# Patient Record
Sex: Male | Born: 1965 | Race: White | Hispanic: No | Smoking: Former smoker
Health system: Southern US, Community
[De-identification: ages and names within clinical notes are randomized; demographics above are authoritative.]

## PROBLEM LIST (undated history)

## (undated) DIAGNOSIS — E785 Hyperlipidemia, unspecified: Secondary | ICD-10-CM

## (undated) DIAGNOSIS — T7840XA Allergy, unspecified, initial encounter: Secondary | ICD-10-CM

## (undated) DIAGNOSIS — M51369 Other intervertebral disc degeneration, lumbar region without mention of lumbar back pain or lower extremity pain: Secondary | ICD-10-CM

## (undated) DIAGNOSIS — K219 Gastro-esophageal reflux disease without esophagitis: Secondary | ICD-10-CM

## (undated) DIAGNOSIS — E559 Vitamin D deficiency, unspecified: Secondary | ICD-10-CM

## (undated) DIAGNOSIS — G4730 Sleep apnea, unspecified: Secondary | ICD-10-CM

## (undated) DIAGNOSIS — U071 COVID-19: Secondary | ICD-10-CM

## (undated) DIAGNOSIS — I10 Essential (primary) hypertension: Secondary | ICD-10-CM

## (undated) DIAGNOSIS — K589 Irritable bowel syndrome without diarrhea: Secondary | ICD-10-CM

## (undated) DIAGNOSIS — M47816 Spondylosis without myelopathy or radiculopathy, lumbar region: Secondary | ICD-10-CM

## (undated) HISTORY — DX: Hyperlipidemia, unspecified: E78.5

## (undated) HISTORY — DX: Vitamin D deficiency, unspecified: E55.9

## (undated) HISTORY — DX: Allergy, unspecified, initial encounter: T78.40XA

## (undated) HISTORY — DX: Irritable bowel syndrome, unspecified: K58.9

## (undated) HISTORY — PX: COLONOSCOPY: SHX174

## (undated) HISTORY — DX: Gastro-esophageal reflux disease without esophagitis: K21.9

## (undated) HISTORY — DX: Sleep apnea, unspecified: G47.30

## (undated) HISTORY — DX: Essential (primary) hypertension: I10

---

## 1997-11-29 ENCOUNTER — Other Ambulatory Visit: Admission: RE | Admit: 1997-11-29 | Discharge: 1997-11-29 | Payer: Self-pay | Admitting: Internal Medicine

## 1998-01-31 ENCOUNTER — Other Ambulatory Visit: Admission: RE | Admit: 1998-01-31 | Discharge: 1998-01-31 | Payer: Self-pay | Admitting: Internal Medicine

## 2001-04-24 ENCOUNTER — Encounter: Payer: Self-pay | Admitting: Internal Medicine

## 2001-04-24 ENCOUNTER — Ambulatory Visit (HOSPITAL_COMMUNITY): Admission: RE | Admit: 2001-04-24 | Discharge: 2001-04-24 | Payer: Self-pay | Admitting: Internal Medicine

## 2002-05-15 HISTORY — PX: CHEST TUBE INSERTION: SHX231

## 2005-01-04 ENCOUNTER — Ambulatory Visit: Payer: Self-pay | Admitting: Gastroenterology

## 2005-01-17 ENCOUNTER — Ambulatory Visit: Payer: Self-pay | Admitting: Gastroenterology

## 2006-08-05 HISTORY — PX: TOE SURGERY: SHX1073

## 2008-05-01 ENCOUNTER — Emergency Department (HOSPITAL_COMMUNITY): Admission: EM | Admit: 2008-05-01 | Discharge: 2008-05-01 | Payer: Self-pay | Admitting: Family Medicine

## 2012-05-24 ENCOUNTER — Emergency Department: Payer: Self-pay | Admitting: Emergency Medicine

## 2012-09-25 ENCOUNTER — Ambulatory Visit (HOSPITAL_COMMUNITY)
Admission: RE | Admit: 2012-09-25 | Discharge: 2012-09-25 | Disposition: A | Discharge status: Home / Self Care | Payer: BC Managed Care – PPO | Source: Ambulatory Visit | Attending: Internal Medicine | Admitting: Internal Medicine

## 2012-09-25 ENCOUNTER — Other Ambulatory Visit (HOSPITAL_COMMUNITY): Payer: Self-pay | Admitting: Internal Medicine

## 2012-09-25 DIAGNOSIS — M545 Low back pain, unspecified: Secondary | ICD-10-CM | POA: Insufficient documentation

## 2012-09-25 DIAGNOSIS — G9589 Other specified diseases of spinal cord: Secondary | ICD-10-CM | POA: Insufficient documentation

## 2012-09-25 DIAGNOSIS — M5380 Other specified dorsopathies, site unspecified: Secondary | ICD-10-CM | POA: Insufficient documentation

## 2013-01-08 ENCOUNTER — Other Ambulatory Visit (HOSPITAL_COMMUNITY): Payer: Self-pay | Admitting: Internal Medicine

## 2013-01-08 ENCOUNTER — Ambulatory Visit (HOSPITAL_COMMUNITY)
Admission: RE | Admit: 2013-01-08 | Discharge: 2013-01-08 | Disposition: A | Discharge status: Home / Self Care | Payer: BC Managed Care – PPO | Source: Ambulatory Visit | Attending: Internal Medicine | Admitting: Internal Medicine

## 2013-01-08 DIAGNOSIS — M25531 Pain in right wrist: Secondary | ICD-10-CM

## 2013-01-08 DIAGNOSIS — M25539 Pain in unspecified wrist: Secondary | ICD-10-CM | POA: Insufficient documentation

## 2013-07-13 ENCOUNTER — Encounter: Payer: Self-pay | Admitting: Internal Medicine

## 2013-07-13 ENCOUNTER — Other Ambulatory Visit: Payer: Self-pay | Admitting: Emergency Medicine

## 2013-07-15 ENCOUNTER — Other Ambulatory Visit: Payer: Self-pay | Admitting: Emergency Medicine

## 2013-07-19 ENCOUNTER — Ambulatory Visit: Payer: Self-pay | Admitting: Internal Medicine

## 2013-08-04 ENCOUNTER — Ambulatory Visit: Payer: Self-pay | Admitting: Internal Medicine

## 2013-08-08 ENCOUNTER — Other Ambulatory Visit: Payer: Self-pay | Admitting: Internal Medicine

## 2013-08-12 ENCOUNTER — Ambulatory Visit: Payer: Self-pay | Admitting: Internal Medicine

## 2013-09-13 ENCOUNTER — Other Ambulatory Visit: Payer: Self-pay | Admitting: Emergency Medicine

## 2013-09-19 ENCOUNTER — Encounter: Payer: Self-pay | Admitting: Internal Medicine

## 2013-09-19 DIAGNOSIS — E559 Vitamin D deficiency, unspecified: Secondary | ICD-10-CM | POA: Insufficient documentation

## 2013-09-19 DIAGNOSIS — E782 Mixed hyperlipidemia: Secondary | ICD-10-CM | POA: Insufficient documentation

## 2013-09-19 DIAGNOSIS — R7309 Other abnormal glucose: Secondary | ICD-10-CM | POA: Insufficient documentation

## 2013-09-19 DIAGNOSIS — Z79899 Other long term (current) drug therapy: Secondary | ICD-10-CM | POA: Insufficient documentation

## 2013-09-19 DIAGNOSIS — I10 Essential (primary) hypertension: Secondary | ICD-10-CM | POA: Insufficient documentation

## 2013-09-19 NOTE — Patient Instructions (Signed)

## 2013-09-19 NOTE — Progress Notes (Signed)
Patient ID: Matthew Salinas, male   DOB: 04/13/1966, 48 y.o.   MRN: [id number]  HPI 48 y.o. male  presents for 3 month follow up with hypertension, hyperlipidemia, prediabetes and vitamin D. His blood pressure has been controlled at home, today their BP is BP: 116/82 mmHg He denies chest pain, shortness of breath, dizziness.  His cholesterol is diet controlled. His cholesterol is not controlled but patient refuses medications, he understands the risk of stroke, MI, and death. The cholesterol last visit was:  LDL 199.  He has been working on diet and exercise for prediabetes, and denies blurry vision, polydipsia, polyphagia and polyuria. Last A1C in the office was: 5.5  Patient is on Vitamin D supplement.  He had a cut on his right hand from chopping wood, 3 days ago, no signs of infection but he works with his hands and states it keeps popping open.  Area on right medial knee, umbilicated and continues to come back.  Right shoulder pain, has seen ortho, has had infections, continues to have pain and unable to lie on that side.    Current Medications:  Current Outpatient Prescriptions on File Prior to Visit  Medication Sig Dispense Refill  . ALPRAZolam (XANAX) 0.5 MG tablet Take 0.5 mg by mouth 2 (two) times daily as needed for anxiety.      . ANUCORT-HC 25 MG suppository INSERT ONE SUPPOSITORY RECTALLY THREE TIMES DAILY AS NEEDED  30 suppository  0  . cholecalciferol (VITAMIN D) 1000 UNITS tablet Take 2,000 Units by mouth daily.      Marland Kitchen lisinopril-hydrochlorothiazide (PRINZIDE,ZESTORETIC) 20-25 MG per tablet Take 1 tablet by mouth daily.      Marland Kitchen LORazepam (ATIVAN) 2 MG tablet TAKE ONE-HALF TO ONE TABLET BY MOUTH AT BEDTIME AS NEEDED FOR SLEEP  30 tablet  0  . Multiple Vitamins-Minerals (MULTIVITAMIN PO) Take by mouth.      Marland Kitchen omeprazole (PRILOSEC) 40 MG capsule Take 40 mg by mouth daily.       No current facility-administered medications on file prior to visit.   Medical History:  Past  Medical History  Diagnosis Date  . Hypertension   . Hyperlipidemia   . GERD (gastroesophageal reflux disease)   . Vitamin D deficiency   . IBS (irritable bowel syndrome)    Allergies:  Allergies  Allergen Reactions  . Prozac [Fluoxetine Hcl]     ROS Constitutional: Denies fever, chills, headaches, insomnia, fatigue, night sweats Eyes: Denies redness, blurred vision, diplopia, discharge, itchy, watery eyes.  ENT:  + snoring Denies congestion, post nasal drip, sore throat, earache, dental pain, Tinnitus, Vertigo, Sinus pain Cardio: Denies chest pain, palpitations, irregular heartbeat, dyspnea, diaphoresis, orthopnea, PND, claudication, edema Respiratory: denies cough, shortness of breath, wheezing.  Gastrointestinal: Denies dysphagia, heartburn, AB pain/ cramps, N/V, diarrhea, constipation, hematemesis, melena, hematochezia,  hemorrhoids Genitourinary: Denies dysuria, frequency, urgency, nocturia, hesitancy, discharge, hematuria, flank pain Musculoskeletal: + right shoulder pain Denies myalgia, stiffness, pain, swelling and strain/sprain. Skin: + skin cut Denies pruritis, rash, changing in skin lesion Neuro: Denies Weakness, tremor, incoordination, spasms, pain Psychiatric: Denies confusion, memory loss, sensory loss Endocrine: Denies change in weight, skin, hair change, nocturia Diabetic Polys, Denies visual blurring, hyper /hypo glycemic episodes, and paresthesia, Heme/Lymph: Denies Excessive bleeding, bruising, enlarged lymph nodes  Family history- Review and unchanged Social history- Review and unchanged Physical Exam: Filed Vitals:   09/20/13 1112  BP: 116/82  Pulse: 72  Temp: 97.9 F (36.6 C)  Resp: 16   Filed Weights  09/20/13 1112  Weight: 211 lb (95.709 kg)   General Appearance: Well nourished, in no apparent distress. Eyes: PERRLA, EOMs, conjunctiva no swelling or erythema Sinuses: No Frontal/maxillary tenderness ENT/Mouth: Ext aud canals clear, TMs without  erythema, bulging. No erythema, swelling, or exudate on post pharynx.  Tonsils are large and likely obstructive but not erythematous. Hearing normal.  Neck: Supple, thyroid normal.  Respiratory: Respiratory effort normal, BS equal bilaterally without rales, rhonchi, wheezing or stridor.  Cardio: RRR with no MRGs. Brisk peripheral pulses without edema.  Abdomen: Soft, + BS.  Non tender, no guarding, rebound, hernias, masses. Lymphatics: Non tender without lymphadenopathy.  Musculoskeletal: Full ROM, 5/5 strength, normal gait.  Skin: Warm, dry without rashes, lesions, ecchymosis. Cut on right hand with flap, no discharge, swelling, mild erythema. .  Neuro: Cranial nerves intact. Normal muscle tone, no cerebellar symptoms. Sensation intact.  Psych: Awake and oriented X 3, normal affect, Insight and Judgment appropriate.   Assessment and Plan:  Hypertension: Continue medication, monitor blood pressure at home.  Continue DASH diet. Cholesterol: Continue diet and exercise. Check cholesterol.  Pre-diabetes-Continue diet and exercise. Check A1C Vitamin D Def- check level and continue medications.  Right leg- schedule removal Right hand- cleaned and applied tegaderm Right shoulder pain- follow up ortho Snoring- likely OSA from tonsils- suggest see Dr. Toy Cookey for sleep medicine- patient will think about it.  Continue diet and meds as discussed. Further disposition pending results of labs.  Vicie Mutters 11:44 AM

## 2013-09-20 ENCOUNTER — Ambulatory Visit (INDEPENDENT_AMBULATORY_CARE_PROVIDER_SITE_OTHER): Payer: BC Managed Care – PPO | Admitting: Physician Assistant

## 2013-09-20 ENCOUNTER — Encounter: Payer: Self-pay | Admitting: Internal Medicine

## 2013-09-20 VITALS — BP 116/82 | HR 72 | Temp 97.9°F | Resp 16 | Wt 211.0 lb

## 2013-09-20 DIAGNOSIS — I10 Essential (primary) hypertension: Secondary | ICD-10-CM

## 2013-09-20 DIAGNOSIS — E782 Mixed hyperlipidemia: Secondary | ICD-10-CM

## 2013-09-20 DIAGNOSIS — R7309 Other abnormal glucose: Secondary | ICD-10-CM

## 2013-09-20 DIAGNOSIS — E559 Vitamin D deficiency, unspecified: Secondary | ICD-10-CM

## 2013-09-20 DIAGNOSIS — Z79899 Other long term (current) drug therapy: Secondary | ICD-10-CM

## 2013-09-20 DIAGNOSIS — R7303 Prediabetes: Secondary | ICD-10-CM

## 2013-09-20 LAB — HEMOGLOBIN A1C
Hgb A1c MFr Bld: 5.3 % (ref <5.7)
Mean Plasma Glucose: 105 mg/dL (ref <117)

## 2013-09-20 LAB — CBC WITH DIFFERENTIAL/PLATELET
BASOS ABS: 0.1 10*3/uL (ref 0.0–0.1)
Basophils Relative: 1 % (ref 0–1)
EOS PCT: 1 % (ref 0–5)
Eosinophils Absolute: 0.1 10*3/uL (ref 0.0–0.7)
HEMATOCRIT: 45 % (ref 39.0–52.0)
Hemoglobin: 15.4 g/dL (ref 13.0–17.0)
LYMPHS PCT: 32 % (ref 12–46)
Lymphs Abs: 2.1 10*3/uL (ref 0.7–4.0)
MCH: 31 pg (ref 26.0–34.0)
MCHC: 34.2 g/dL (ref 30.0–36.0)
MCV: 90.7 fL (ref 78.0–100.0)
Monocytes Absolute: 0.5 10*3/uL (ref 0.1–1.0)
Monocytes Relative: 7 % (ref 3–12)
NEUTROS ABS: 3.8 10*3/uL (ref 1.7–7.7)
NEUTROS PCT: 59 % (ref 43–77)
PLATELETS: 326 10*3/uL (ref 150–400)
RBC: 4.96 MIL/uL (ref 4.22–5.81)
RDW: 12.4 % (ref 11.5–15.5)
WBC: 6.5 10*3/uL (ref 4.0–10.5)

## 2013-09-20 LAB — MAGNESIUM: Magnesium: 2 mg/dL (ref 1.5–2.5)

## 2013-09-20 MED ORDER — HYDROCODONE-ACETAMINOPHEN 5-325 MG PO TABS: 1.0000 | ORAL_TABLET | Freq: Four times a day (QID) | ORAL | Status: DC | PRN

## 2013-09-21 LAB — BASIC METABOLIC PANEL WITH GFR
BUN: 13 mg/dL (ref 6–23)
CO2: 28 mEq/L (ref 19–32)
CREATININE: 0.86 mg/dL (ref 0.50–1.35)
Calcium: 9.8 mg/dL (ref 8.4–10.5)
Chloride: 96 mEq/L (ref 96–112)
GFR, Est African American: >89 mL/min
GFR, Est Non African American: >89 mL/min
Glucose, Bld: 96 mg/dL (ref 70–99)
POTASSIUM: 4.6 meq/L (ref 3.5–5.3)
Sodium: 137 mEq/L (ref 135–145)

## 2013-09-21 LAB — HEPATIC FUNCTION PANEL
ALBUMIN: 5 g/dL (ref 3.5–5.2)
ALT: 21 U/L (ref 0–53)
AST: 18 U/L (ref 0–37)
Alkaline Phosphatase: 51 U/L (ref 39–117)
Bilirubin, Direct: 0.1 mg/dL (ref 0.0–0.3)
Indirect Bilirubin: 0.6 mg/dL (ref 0.2–1.2)
Total Bilirubin: 0.7 mg/dL (ref 0.2–1.2)
Total Protein: 7.6 g/dL (ref 6.0–8.3)

## 2013-09-21 LAB — LIPID PANEL
Cholesterol: 297 mg/dL — ABNORMAL HIGH (ref 0–200)
HDL: 49 mg/dL (ref >39)
LDL CALC: 202 mg/dL — AB (ref 0–99)
Total CHOL/HDL Ratio: 6.1 Ratio
Triglycerides: 228 mg/dL — ABNORMAL HIGH (ref <150)
VLDL: 46 mg/dL — ABNORMAL HIGH (ref 0–40)

## 2013-09-21 LAB — INSULIN, FASTING: INSULIN FASTING, SERUM: 5 u[IU]/mL (ref 3–28)

## 2013-09-21 LAB — TSH: TSH: 1.399 u[IU]/mL (ref 0.350–4.500)

## 2013-09-21 LAB — VITAMIN D 25 HYDROXY (VIT D DEFICIENCY, FRACTURES): VIT D 25 HYDROXY: 73 ng/mL (ref 30–89)

## 2013-10-31 ENCOUNTER — Other Ambulatory Visit: Payer: Self-pay | Admitting: Emergency Medicine

## 2013-11-01 ENCOUNTER — Other Ambulatory Visit: Payer: Self-pay | Admitting: Emergency Medicine

## 2013-12-05 ENCOUNTER — Other Ambulatory Visit: Payer: Self-pay | Admitting: Internal Medicine

## 2013-12-20 ENCOUNTER — Other Ambulatory Visit: Payer: Self-pay | Admitting: Emergency Medicine

## 2013-12-21 ENCOUNTER — Other Ambulatory Visit: Payer: Self-pay | Admitting: Emergency Medicine

## 2014-01-18 ENCOUNTER — Encounter: Payer: Self-pay | Admitting: Internal Medicine

## 2014-01-19 ENCOUNTER — Ambulatory Visit (INDEPENDENT_AMBULATORY_CARE_PROVIDER_SITE_OTHER): Payer: BC Managed Care – PPO | Admitting: Internal Medicine

## 2014-01-19 ENCOUNTER — Encounter: Payer: Self-pay | Admitting: Internal Medicine

## 2014-01-19 VITALS — BP 106/70 | HR 76 | Temp 97.9°F | Resp 16 | Ht 70.0 in | Wt 213.4 lb

## 2014-01-19 DIAGNOSIS — K219 Gastro-esophageal reflux disease without esophagitis: Secondary | ICD-10-CM

## 2014-01-19 DIAGNOSIS — K649 Unspecified hemorrhoids: Secondary | ICD-10-CM

## 2014-01-19 DIAGNOSIS — Z125 Encounter for screening for malignant neoplasm of prostate: Secondary | ICD-10-CM

## 2014-01-19 DIAGNOSIS — I10 Essential (primary) hypertension: Secondary | ICD-10-CM

## 2014-01-19 DIAGNOSIS — D485 Neoplasm of uncertain behavior of skin: Secondary | ICD-10-CM

## 2014-01-19 DIAGNOSIS — R7401 Elevation of levels of liver transaminase levels: Secondary | ICD-10-CM

## 2014-01-19 DIAGNOSIS — E559 Vitamin D deficiency, unspecified: Secondary | ICD-10-CM

## 2014-01-19 DIAGNOSIS — Z0000 Encounter for general adult medical examination without abnormal findings: Secondary | ICD-10-CM

## 2014-01-19 DIAGNOSIS — R740 Nonspecific elevation of levels of transaminase and lactic acid dehydrogenase [LDH]: Secondary | ICD-10-CM

## 2014-01-19 DIAGNOSIS — Z113 Encounter for screening for infections with a predominantly sexual mode of transmission: Secondary | ICD-10-CM

## 2014-01-19 DIAGNOSIS — Z1212 Encounter for screening for malignant neoplasm of rectum: Secondary | ICD-10-CM

## 2014-01-19 DIAGNOSIS — M545 Low back pain, unspecified: Secondary | ICD-10-CM

## 2014-01-19 DIAGNOSIS — Z111 Encounter for screening for respiratory tuberculosis: Secondary | ICD-10-CM

## 2014-01-19 LAB — CBC WITH DIFFERENTIAL/PLATELET
BASOS ABS: 0 10*3/uL (ref 0.0–0.1)
Basophils Relative: 1 % (ref 0–1)
Eosinophils Absolute: 0 10*3/uL (ref 0.0–0.7)
Eosinophils Relative: 1 % (ref 0–5)
HEMATOCRIT: 42.3 % (ref 39.0–52.0)
HEMOGLOBIN: 14.9 g/dL (ref 13.0–17.0)
Lymphocytes Relative: 41 % (ref 12–46)
Lymphs Abs: 1.6 10*3/uL (ref 0.7–4.0)
MCH: 31 pg (ref 26.0–34.0)
MCHC: 35.2 g/dL (ref 30.0–36.0)
MCV: 87.9 fL (ref 78.0–100.0)
MONO ABS: 0.6 10*3/uL (ref 0.1–1.0)
MONOS PCT: 17 % — AB (ref 3–12)
NEUTROS ABS: 1.5 10*3/uL — AB (ref 1.7–7.7)
Neutrophils Relative %: 40 % — ABNORMAL LOW (ref 43–77)
Platelets: 272 10*3/uL (ref 150–400)
RBC: 4.81 MIL/uL (ref 4.22–5.81)
RDW: 12.9 % (ref 11.5–15.5)
WBC: 3.8 10*3/uL — AB (ref 4.0–10.5)

## 2014-01-19 LAB — HEMOGLOBIN A1C
Hgb A1c MFr Bld: 5.4 % (ref <5.7)
Mean Plasma Glucose: 108 mg/dL (ref <117)

## 2014-01-19 MED ORDER — HYDROCORTISONE 2.5 % RE CREA: 1.0000 "application " | TOPICAL_CREAM | Freq: Four times a day (QID) | RECTAL | Status: DC

## 2014-01-19 MED ORDER — HYDROCODONE-ACETAMINOPHEN 5-325 MG PO TABS: 1.0000 | ORAL_TABLET | Freq: Four times a day (QID) | ORAL | Status: DC | PRN

## 2014-01-19 MED ORDER — ESOMEPRAZOLE MAGNESIUM 40 MG PO CPDR: 40.0000 mg | DELAYED_RELEASE_CAPSULE | Freq: Every day | ORAL | Status: DC

## 2014-01-19 NOTE — Patient Instructions (Signed)
 Hypertension As your heart beats, it forces blood through your arteries. This force is your blood pressure. If the pressure is too high, it is called hypertension (HTN) or high blood pressure. HTN is dangerous because you may have it and not know it. High blood pressure may mean that your heart has to work harder to pump blood. Your arteries may be narrow or stiff. The extra work puts you at risk for heart disease, stroke, and other problems.  Blood pressure consists of two numbers, a higher number over a lower, 110/72, for example. It is stated as "110 over 72." The ideal is below 120 for the top number (systolic) and under 80 for the bottom (diastolic). Write down your blood pressure today. You should pay close attention to your blood pressure if you have certain conditions such as:  Heart failure.  Prior heart attack.  Diabetes  Chronic kidney disease.  Prior stroke.  Multiple risk factors for heart disease. To see if you have HTN, your blood pressure should be measured while you are seated with your arm held at the level of the heart. It should be measured at least twice. A one-time elevated blood pressure reading (especially in the Emergency Department) does not mean that you need treatment. There may be conditions in which the blood pressure is different between your right and left arms. It is important to see your caregiver soon for a recheck. Most people have essential hypertension which means that there is not a specific cause. This type of high blood pressure may be lowered by changing lifestyle factors such as:  Stress.  Smoking.  Lack of exercise.  Excessive weight.  Drug/tobacco/alcohol use.  Eating less salt. Most people do not have symptoms from high blood pressure until it has caused damage to the body. Effective treatment can often prevent, delay or reduce that damage. TREATMENT  When a cause has been identified, treatment for high blood pressure is directed at  the cause. There are a large number of medications to treat HTN. These fall into several categories, and your caregiver will help you select the medicines that are best for you. Medications may have side effects. You should review side effects with your caregiver. If your blood pressure stays high after you have made lifestyle changes or started on medicines,   Your medication(s) may need to be changed.  Other problems may need to be addressed.  Be certain you understand your prescriptions, and know how and when to take your medicine.  Be sure to follow up with your caregiver within the time frame advised (usually within two weeks) to have your blood pressure rechecked and to review your medications.  If you are taking more than one medicine to lower your blood pressure, make sure you know how and at what times they should be taken. Taking two medicines at the same time can result in blood pressure that is too low. SEEK IMMEDIATE MEDICAL CARE IF:  You develop a severe headache, blurred or changing vision, or confusion.  You have unusual weakness or numbness, or a faint feeling.  You have severe chest or abdominal pain, vomiting, or breathing problems. MAKE SURE YOU:   Understand these instructions.  Will watch your condition.  Will get help right away if you are not doing well or get worse.   Diabetes and Exercise Exercising regularly is important. It is not just about losing weight. It has many health benefits, such as:  Improving your overall fitness, flexibility, and   endurance.  Increasing your bone density.  Helping with weight control.  Decreasing your body fat.  Increasing your muscle strength.  Reducing stress and tension.  Improving your overall health. People with diabetes who exercise gain additional benefits because exercise:  Reduces appetite.  Improves the body's use of blood sugar (glucose).  Helps lower or control blood glucose.  Decreases blood  pressure.  Helps control blood lipids (such as cholesterol and triglycerides).  Improves the body's use of the hormone insulin by:  Increasing the body's insulin sensitivity.  Reducing the body's insulin needs.  Decreases the risk for heart disease because exercising:  Lowers cholesterol and triglycerides levels.  Increases the levels of good cholesterol (such as high-density lipoproteins [HDL]) in the body.  Lowers blood glucose levels. YOUR ACTIVITY PLAN  Choose an activity that you enjoy and set realistic goals. Your health care Arkie Tagliaferro or diabetes educator can help you make an activity plan that works for you. You can break activities into 2 or 3 sessions throughout the day. Doing so is as good as one long session. Exercise ideas include:  Taking the dog for a walk.  Taking the stairs instead of the elevator.  Dancing to your favorite song.  Doing your favorite exercise with a friend. RECOMMENDATIONS FOR EXERCISING WITH TYPE 1 OR TYPE 2 DIABETES   Check your blood glucose before exercising. If blood glucose levels are greater than 240 mg/dL, check for urine ketones. Do not exercise if ketones are present.  Avoid injecting insulin into areas of the body that are going to be exercised. For example, avoid injecting insulin into:  The arms when playing tennis.  The legs when jogging.  Keep a record of:  Food intake before and after you exercise.  Expected peak times of insulin action.  Blood glucose levels before and after you exercise.  The type and amount of exercise you have done.  Review your records with your health care Cherrie Franca. Your health care Caledonia Zou will help you to develop guidelines for adjusting food intake and insulin amounts before and after exercising.  If you take insulin or oral hypoglycemic agents, watch for signs and symptoms of hypoglycemia. They include:  Dizziness.  Shaking.  Sweating.  Chills.  Confusion.  Drink plenty of water  while you exercise to prevent dehydration or heat stroke. Body water is lost during exercise and must be replaced.  Talk to your health care Snyder Colavito before starting an exercise program to make sure it is safe for you. Remember, almost any type of activity is better than none.    Cholesterol Cholesterol is a white, waxy, fat-like protein needed by your body in small amounts. The liver makes all the cholesterol you need. It is carried from the liver by the blood through the blood vessels. Deposits (plaque) may build up on blood vessel walls. This makes the arteries narrower and stiffer. Plaque increases the risk for heart attack and stroke. You cannot feel your cholesterol level even if it is very high. The only way to know is by a blood test to check your lipid (fats) levels. Once you know your cholesterol levels, you should keep a record of the test results. Work with your caregiver to to keep your levels in the desired range. WHAT THE RESULTS MEAN:  Total cholesterol is a rough measure of all the cholesterol in your blood.  LDL is the so-called bad cholesterol. This is the type that deposits cholesterol in the walls of the arteries. You want this   level to be low.  HDL is the good cholesterol because it cleans the arteries and carries the LDL away. You want this level to be high.  Triglycerides are fat that the body can either burn for energy or store. High levels are closely linked to heart disease. DESIRED LEVELS:  Total cholesterol below 200.  LDL below 100 for people at risk, below 70 for very high risk.  HDL above 50 is good, above 60 is best.  Triglycerides below 150. HOW TO LOWER YOUR CHOLESTEROL:  Diet.  Choose fish or white meat chicken and turkey, roasted or baked. Limit fatty cuts of red meat, fried foods, and processed meats, such as sausage and lunch meat.  Eat lots of fresh fruits and vegetables. Choose whole grains, beans, pasta, potatoes and cereals.  Use only  small amounts of olive, corn or canola oils. Avoid butter, mayonnaise, shortening or palm kernel oils. Avoid foods with trans-fats.  Use skim/nonfat milk and low-fat/nonfat yogurt and cheeses. Avoid whole milk, cream, ice cream, egg yolks and cheeses. Healthy desserts include angel food cake, ginger snaps, animal crackers, hard candy, popsicles, and low-fat/nonfat frozen yogurt. Avoid pastries, cakes, pies and cookies.  Exercise.  A regular program helps decrease LDL and raises HDL.  Helps with weight control.  Do things that increase your activity level like gardening, walking, or taking the stairs.  Medication.  May be prescribed by your caregiver to help lowering cholesterol and the risk for heart disease.  You may need medicine even if your levels are normal if you have several risk factors. HOME CARE INSTRUCTIONS   Follow your diet and exercise programs as suggested by your caregiver.  Take medications as directed.  Have blood work done when your caregiver feels it is necessary. MAKE SURE YOU:   Understand these instructions.  Will watch your condition.  Will get help right away if you are not doing well or get worse.      Vitamin D Deficiency Vitamin D is an important vitamin that your body needs. Having too little of it in your body is called a deficiency. A very bad deficiency can make your bones soft and can cause a condition called rickets.  Vitamin D is important to your body for different reasons, such as:   It helps your body absorb 2 minerals called calcium and phosphorus.  It helps make your bones healthy.  It may prevent some diseases, such as diabetes and multiple sclerosis.  It helps your muscles and heart. You can get vitamin D in several ways. It is a natural part of some foods. The vitamin is also added to some dairy products and cereals. Some people take vitamin D supplements. Also, your body makes vitamin D when you are in the sun. It changes the  sun's rays into a form of the vitamin that your body can use. CAUSES   Not eating enough foods that contain vitamin D.  Not getting enough sunlight.  Having certain digestive system diseases that make it hard to absorb vitamin D. These diseases include Crohn's disease, chronic pancreatitis, and cystic fibrosis.  Having a surgery in which part of the stomach or small intestine is removed.  Being obese. Fat cells pull vitamin D out of your blood. That means that obese people may not have enough vitamin D left in their blood and in other body tissues.  Having chronic kidney or liver disease. RISK FACTORS Risk factors are things that make you more likely to develop a vitamin   D deficiency. They include:  Being older.  Not being able to get outside very much.  Living in a nursing home.  Having had broken bones.  Having weak or thin bones (osteoporosis).  Having a disease or condition that changes how your body absorbs vitamin D.  Having dark skin.  Some medicines such as seizure medicines or steroids.  Being overweight or obese. SYMPTOMS Mild cases of vitamin D deficiency may not have any symptoms. If you have a very bad case, symptoms may include:  Bone pain.  Muscle pain.  Falling often.  Broken bones caused by a minor injury, due to osteoporosis. DIAGNOSIS A blood test is the best way to tell if you have a vitamin D deficiency. TREATMENT Vitamin D deficiency can be treated in different ways. Treatment for vitamin D deficiency depends on what is causing it. Options include:  Taking vitamin D supplements.  Taking a calcium supplement. Your caregiver will suggest what dose is best for you. HOME CARE INSTRUCTIONS  Take any supplements that your caregiver prescribes. Follow the directions carefully. Take only the suggested amount.  Have your blood tested 2 months after you start taking supplements.  Eat foods that contain vitamin D. Healthy choices  include:  Fortified dairy products, cereals, or juices. Fortified means vitamin D has been added to the food. Check the label on the package to be sure.  Fatty fish like salmon or trout.  Eggs.  Oysters.  Do not use a tanning bed.  Keep your weight at a healthy level. Lose weight if you need to.  Keep all follow-up appointments. Your caregiver will need to perform blood tests to make sure your vitamin D deficiency is going away. SEEK MEDICAL CARE IF:  You have any questions about your treatment.  You continue to have symptoms of vitamin D deficiency.  You have nausea or vomiting.  You are constipated.  You feel confused.  You have severe abdominal or back pain. MAKE SURE YOU:  Understand these instructions.  Will watch your condition.  Will get help right away if you are not doing well or get worse.   

## 2014-01-19 NOTE — Progress Notes (Signed)
Patient ID: Matthew Salinas, male   DOB: 04-28-1966, 48 y.o.   MRN: [id number]  Annual Screening Comprehensive Examination  This very nice 48 y.o.SWM presents for complete physical.  Patient has been followed for HTN, Diabetes  Prediabetes, Hyperlipidemia, and Vitamin D Deficiency.   HTN predates since  2005. Patient's BP has been controlled at home.Today's BP: 106/70 mmHg. Patient denies any cardiac symptoms as chest pain, palpitations, shortness of breath, dizziness or ankle swelling.   Patient is Statin  And Zetia intolerant and his Lipids are not controlled with diet. Patient denies myalgias or other medication SE's. Last lipid profile in Feb 2015 is not at goal.   Lab Results  Component Value Date   CHOL 297* 09/20/2013   HDL 49 09/20/2013   LDLCALC 202* 09/20/2013   TRIG 228* 09/20/2013   CHOLHDL 6.1 09/20/2013    Patient is monitored for  prediabetes and insulin resistance and last A1c was 5.4% in Feb 2015. Patient denies reactive hypoglycemic symptoms, visual blurring, diabetic polys or paresthesias.    Finally, patient has history of Vitamin D Deficiency of  44 in 2008 and last vitamin D was 72 in Sept 2014.  Medication Sig  . ALPRAZolam (XANAX XR) 1 MG 24  TAKE 1/2-1 tab TWICE DAILY AS NEEDED   . ANUCORT-HC 25 MG suppository INSERT  THREE TIMES DAILY AS NEEDED  . VITAMIN D 1000 UNITS tablet Take 2,000 Units by mouth daily.  . Lisinopril-hctz 20-25 MG per tablet Take 1 tablet by mouth daily.  Marland Kitchen LORazepam (ATIVAN) 2 MG tablet TAKE 1/2 - 1 AT BEDTIME   . MultVitamins-Minerals  Take by mouth.   Allergies  Allergen Reactions  . Prozac [Fluoxetine Hcl]    Past Medical History  Diagnosis Date  . Hypertension   . Hyperlipidemia   . GERD (gastroesophageal reflux disease)   . Vitamin D deficiency   . IBS (irritable bowel syndrome)    Past Surgical History  Procedure Laterality Date  . Chest tube insertion Right 05-15-02    thoracostomy   Family History  Problem Relation Age  of Onset  . Cancer Mother     lung  . Hypertension Father   . Hyperlipidemia Father   . Dementia Brother    History   Social History  . Marital Status: Married    Spouse Name: N/A    Number of Children: N/A  . Years of Education: N/A   Occupational History  . Not on file.   Social History Main Topics  . Smoking status: Former Smoker    Quit date: 08/05/2001  . Smokeless tobacco: Never Used  . Alcohol Use: 3.5 oz/week    7 drink(s) per week  . Drug Use: No  . Sexual Activity: Not on file    ROS Constitutional: Denies fever, chills, weight loss/gain, headaches, insomnia, fatigue, night sweats or change in appetite. Eyes: Denies redness, blurred vision, diplopia, discharge, itchy or watery eyes.  ENT: Denies discharge, congestion, post nasal drip, epistaxis, sore throat, earache, hearing loss, dental pain, Tinnitus, Vertigo, Sinus pain or snoring.  Cardio: Denies chest pain, palpitations, irregular heartbeat, syncope, dyspnea, diaphoresis, orthopnea, PND, claudication or edema Respiratory: denies cough, dyspnea, DOE, pleurisy, hoarseness, laryngitis or wheezing.  Gastrointestinal: Denies dysphagia, heartburn, reflux, water brash, pain, cramps, nausea, vomiting, bloating, diarrhea, constipation, hematemesis, melena, hematochezia, jaundice or hemorrhoids Genitourinary: Denies dysuria, frequency, urgency, nocturia, hesitancy, discharge, hematuria or flank pain Musculoskeletal: Denies arthralgia, myalgia, stiffness, Jt. Swelling, pain, limp or strain/sprain. Skin: Denies puritis, rash,  hives, warts, acne, eczema or change in skin lesion Neuro: No weakness, tremor, incoordination, spasms, paresthesia or pain Psychiatric: Denies confusion, memory loss or sensory loss Endocrine: Denies change in weight, skin, hair change, nocturia, and paresthesia, diabetic polys, visual blurring or hyper / hypo glycemic episodes.  Heme/Lymph: No excessive bleeding, bruising or enlarged lymph  nodes.  Physical Exam  BP 106/70  P 76  T 97.9 F   R16  Ht 5\' 10"    Wt 213 lb 6.4 oz   BMI 30.62 kg/m2  General Appearance: Well nourished, in no apparent distress. Eyes: PERRLA, EOMs, conjunctiva no swelling or erythema, normal fundi and vessels. Sinuses: No frontal/maxillary tenderness ENT/Mouth: EACs patent / TMs  nl. Nares clear without erythema, swelling, mucoid exudates. Oral hygiene is good. No erythema, swelling, or exudate. Tongue normal, non-obstructing. Tonsils not swollen or erythematous. Hearing normal.  Neck: Supple, thyroid normal. No bruits, nodes or JVD. Respiratory: Respiratory effort normal.  BS equal and clear bilateral without rales, rhonci, wheezing or stridor. Cardio: Heart sounds are normal with regular rate and rhythm and no murmurs, rubs or gallops. Peripheral pulses are normal and equal bilaterally without edema. No aortic or femoral bruits. Chest: symmetric with normal excursions and percussion.  Abdomen: Flat, soft, with bowl sounds. Nontender, no guarding, rebound, hernias, masses, or organomegaly.  Lymphatics: Non tender without lymphadenopathy.  Genitourinary: No hernias.Testes nl. DRE - prostate nl for age - smooth & firm w/o nodules. Musculoskeletal: Full ROM all peripheral extremities, joint stability, 5/5 strength, and normal gait. Skin: Warm and dry without rashes, lesions, cyanosis, clubbing or  ecchymosis.  Neuro: Cranial nerves intact, reflexes equal bilaterally. Normal muscle tone, no cerebellar symptoms. Sensation intact.  Pysch: Awake and oriented X 3, normal affect, insight and judgment appropriate.   Assessment and Plan  1. Annual Screening Examination 2. Hypertension  3. Hyperlipidemia 4. Pre Diabetes 5. Vitamin D Deficiency  Continue prudent diet as discussed, weight control, BP monitoring, regular exercise, and medications as discussed.  Discussed med effects and SE's. Routine screening labs and tests as requested with regular  follow-up as recommended.

## 2014-01-20 LAB — BASIC METABOLIC PANEL WITH GFR
BUN: 12 mg/dL (ref 6–23)
CHLORIDE: 99 meq/L (ref 96–112)
CO2: 18 mEq/L — ABNORMAL LOW (ref 19–32)
Calcium: 9.5 mg/dL (ref 8.4–10.5)
Creat: 0.81 mg/dL (ref 0.50–1.35)
GLUCOSE: 102 mg/dL — AB (ref 70–99)
POTASSIUM: 4.4 meq/L (ref 3.5–5.3)
Sodium: 138 mEq/L (ref 135–145)

## 2014-01-20 LAB — HEPATIC FUNCTION PANEL
ALT: 29 U/L (ref 0–53)
AST: 26 U/L (ref 0–37)
Albumin: 4.5 g/dL (ref 3.5–5.2)
Alkaline Phosphatase: 61 U/L (ref 39–117)
BILIRUBIN DIRECT: 0.1 mg/dL (ref 0.0–0.3)
BILIRUBIN INDIRECT: 0.4 mg/dL (ref 0.2–1.2)
Total Bilirubin: 0.5 mg/dL (ref 0.2–1.2)
Total Protein: 7.6 g/dL (ref 6.0–8.3)

## 2014-01-20 LAB — LIPID PANEL
CHOL/HDL RATIO: 6.1 ratio
CHOLESTEROL: 268 mg/dL — AB (ref 0–200)
HDL: 44 mg/dL (ref >39)
LDL Cholesterol: 175 mg/dL — ABNORMAL HIGH (ref 0–99)
TRIGLYCERIDES: 247 mg/dL — AB (ref <150)
VLDL: 49 mg/dL — ABNORMAL HIGH (ref 0–40)

## 2014-01-20 LAB — MAGNESIUM: Magnesium: 2 mg/dL (ref 1.5–2.5)

## 2014-01-20 LAB — VITAMIN B12: VITAMIN B 12: 401 pg/mL (ref 211–911)

## 2014-01-20 LAB — PSA: PSA: 0.54 ng/mL (ref <=4.00)

## 2014-01-20 LAB — URINALYSIS, MICROSCOPIC ONLY
Bacteria, UA: NONE SEEN
CASTS: NONE SEEN
CRYSTALS: NONE SEEN
Squamous Epithelial / LPF: NONE SEEN

## 2014-01-20 LAB — MICROALBUMIN / CREATININE URINE RATIO
Creatinine, Urine: 82.8 mg/dL
Microalb Creat Ratio: 6.8 mg/g (ref 0.0–30.0)
Microalb, Ur: 0.56 mg/dL (ref 0.00–1.89)

## 2014-01-20 LAB — HEPATITIS B SURFACE ANTIBODY,QUALITATIVE: Hep B S Ab: POSITIVE — AB

## 2014-01-20 LAB — TESTOSTERONE: TESTOSTERONE: 394 ng/dL (ref 300–890)

## 2014-01-20 LAB — INSULIN, FASTING: Insulin fasting, serum: 12 u[IU]/mL (ref 3–28)

## 2014-01-20 LAB — HEPATITIS C ANTIBODY: HCV Ab: NEGATIVE

## 2014-01-20 LAB — VITAMIN D 25 HYDROXY (VIT D DEFICIENCY, FRACTURES): Vit D, 25-Hydroxy: 66 ng/mL (ref 30–89)

## 2014-01-20 LAB — HIV ANTIBODY (ROUTINE TESTING W REFLEX): HIV 1&2 Ab, 4th Generation: NONREACTIVE

## 2014-01-20 LAB — RPR

## 2014-01-20 LAB — HEPATITIS A ANTIBODY, TOTAL: Hep A Total Ab: NONREACTIVE

## 2014-01-20 LAB — TSH: TSH: 1.945 u[IU]/mL (ref 0.350–4.500)

## 2014-01-20 LAB — HEPATITIS B CORE ANTIBODY, TOTAL: Hep B Core Total Ab: NONREACTIVE

## 2014-01-21 LAB — TB SKIN TEST
INDURATION: 0 mm
TB SKIN TEST: NEGATIVE

## 2014-01-21 LAB — HEPATITIS B E ANTIBODY: Hepatitis Be Antibody: NONREACTIVE

## 2014-01-26 ENCOUNTER — Other Ambulatory Visit (INDEPENDENT_AMBULATORY_CARE_PROVIDER_SITE_OTHER): Payer: BC Managed Care – PPO

## 2014-01-26 DIAGNOSIS — Z1212 Encounter for screening for malignant neoplasm of rectum: Secondary | ICD-10-CM

## 2014-01-26 LAB — POC HEMOCCULT BLD/STL (HOME/3-CARD/SCREEN)
Card #3 Fecal Occult Blood, POC: NEGATIVE
FECAL OCCULT BLD: NEGATIVE
Fecal Occult Blood, POC: NEGATIVE

## 2014-01-31 ENCOUNTER — Other Ambulatory Visit: Payer: Self-pay | Admitting: *Deleted

## 2014-01-31 MED ORDER — GEMFIBROZIL 600 MG PO TABS: 600.0000 mg | ORAL_TABLET | Freq: Two times a day (BID) | ORAL | Status: DC

## 2014-02-18 ENCOUNTER — Other Ambulatory Visit: Payer: Self-pay | Admitting: Emergency Medicine

## 2014-02-18 ENCOUNTER — Telehealth: Payer: Self-pay | Admitting: *Deleted

## 2014-02-18 NOTE — Telephone Encounter (Signed)
Patient called and asked for stronger hemorrhoid med.  Per Dr Melford Aase , if [place]  not helping the next thing to do is to see a surgeon.

## 2014-03-02 ENCOUNTER — Other Ambulatory Visit: Payer: Self-pay | Admitting: Emergency Medicine

## 2014-04-26 ENCOUNTER — Ambulatory Visit: Payer: Self-pay | Admitting: Physician Assistant

## 2014-05-03 ENCOUNTER — Other Ambulatory Visit: Payer: Self-pay | Admitting: Physician Assistant

## 2014-05-16 ENCOUNTER — Ambulatory Visit (HOSPITAL_COMMUNITY)
Admission: RE | Admit: 2014-05-16 | Discharge: 2014-05-16 | Disposition: A | Discharge status: Home / Self Care | Payer: BC Managed Care – PPO | Source: Ambulatory Visit | Attending: Internal Medicine | Admitting: Internal Medicine

## 2014-05-16 ENCOUNTER — Ambulatory Visit (INDEPENDENT_AMBULATORY_CARE_PROVIDER_SITE_OTHER): Payer: BC Managed Care – PPO | Admitting: Internal Medicine

## 2014-05-16 ENCOUNTER — Encounter: Payer: Self-pay | Admitting: Internal Medicine

## 2014-05-16 VITALS — BP 124/84 | HR 84 | Temp 98.4°F | Resp 16 | Ht 70.75 in | Wt 216.2 lb

## 2014-05-16 DIAGNOSIS — E559 Vitamin D deficiency, unspecified: Secondary | ICD-10-CM

## 2014-05-16 DIAGNOSIS — I10 Essential (primary) hypertension: Secondary | ICD-10-CM

## 2014-05-16 DIAGNOSIS — R0602 Shortness of breath: Secondary | ICD-10-CM | POA: Insufficient documentation

## 2014-05-16 DIAGNOSIS — Z8709 Personal history of other diseases of the respiratory system: Secondary | ICD-10-CM | POA: Insufficient documentation

## 2014-05-16 DIAGNOSIS — E782 Mixed hyperlipidemia: Secondary | ICD-10-CM

## 2014-05-16 DIAGNOSIS — Z79899 Other long term (current) drug therapy: Secondary | ICD-10-CM

## 2014-05-16 DIAGNOSIS — R7309 Other abnormal glucose: Secondary | ICD-10-CM

## 2014-05-16 DIAGNOSIS — R0600 Dyspnea, unspecified: Secondary | ICD-10-CM

## 2014-05-16 DIAGNOSIS — R7303 Prediabetes: Secondary | ICD-10-CM

## 2014-05-16 NOTE — Progress Notes (Signed)
Patient ID: Matthew Salinas, male   DOB: 02/18/1966, 48 y.o.   MRN: [id number]   This very nice 48 y.o.male presents for 3 month follow up with Hypertension, Hyperlipidemia, Pre-Diabetes and Vitamin D Deficiency.    Patient is treated for HTN & BP has been controlled at home. Today's BP: 124/84 mmHg. Patient has had no complaints of any cardiac type chest pain, palpitations,  dizziness, claudication, or dependent edema.He doe have remote hx/o of a spontaneous right PTX and now relates 1 week hx/o difficulty breathing w/o CP or discomfort, orthopnea orthopnea or DOE and his O2 sat measured 98% at rest as he was c/o dyspnea.   Hyperlipidemia is  Not controlled with diet & meds. Patient denies myalgias or other med SE's. Last Lipids were Total Chol 268*; HDL Chol 44; LDL  175*; [id number] on 01/19/2014.   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  Last A1c was  5.4% on 01/19/2014.    Further, the patient also has history of Vitamin D Deficiency and supplements vitamin D without any suspected side-effects. Last vitamin D was 66 on 01/19/2014.    Medication List   ALPRAZolam 1 MG 24 hr tablet  Commonly known as:  XANAX XR  TAKE ONE-HALF TO ONE TABLET BY MOUTH TWICE DAILY AS NEEDED     ANUCORT-HC 25 MG suppository  Generic drug:  hydrocortisone  INSERT ONE SUPPOSITORY RECTALLY THREE TIMES DAILY AS NEEDED     cholecalciferol 1000 UNITS tablet  Commonly known as:  VITAMIN D  Take 2,000 Units by mouth daily.     esomeprazole 40 MG capsule  Commonly known as:  NEXIUM  Take 1 capsule (40 mg total) by mouth daily. For acid indigestion     gemfibrozil 600 MG tablet  Commonly known as:  LOPID  Take 1 tablet (600 mg total) by mouth 2 (two) times daily.     HYDROcodone-acetaminophen 5-325 MG per tablet  Commonly known as:  NORCO  Take 1 tablet by mouth every 6 (six) hours as needed for moderate  pain.     hydrocortisone 2.5 % rectal cream  Commonly known as:  ANUSOL-HC  Place 1 application rectally 4 (four) times daily.     lisinopril-hydrochlorothiazide 20-25 MG per tablet  Commonly known as:  PRINZIDE,ZESTORETIC  Take 1 tablet by mouth daily.     LORazepam 2 MG tablet  Commonly known as:  ATIVAN  TAKE ONE-HALF TO ONE TABLET BY MOUTH AT BEDTIME AS NEEDED FOR SLEEP     MULTIVITAMIN PO  Take by mouth.     Allergies  Allergen Reactions  . Prozac [Fluoxetine Hcl]    PMHx:   Past Medical History  Diagnosis Date  . Hypertension   . Hyperlipidemia   . GERD (gastroesophageal reflux disease)   . Vitamin D deficiency   . IBS (irritable bowel syndrome)    Immunization History  Administered Date(s) Administered  . DTaP 06/10/2006  . PPD Test 01/19/2014  . Pneumococcal-Unspecified 01/06/2012   Past Surgical History  Procedure Laterality Date  . Chest tube insertion Right 05-15-02    thoracostomy   FHx:    Reviewed / unchanged  SHx:    Reviewed / unchanged  Systems Review:  Constitutional: Denies fever, chills, wt changes, headaches, insomnia, fatigue, night sweats, change in appetite. Eyes: Denies redness, blurred vision, diplopia, discharge, itchy, watery eyes.  ENT: Denies discharge, congestion, post nasal drip, epistaxis, sore  throat, earache, hearing loss, dental pain, tinnitus, vertigo, sinus pain, snoring.  CV: Denies chest pain, palpitations, irregular heartbeat, syncope, dyspnea, diaphoresis, orthopnea, PND, claudication or edema. Respiratory: denies cough, dyspnea, DOE, pleurisy, hoarseness, laryngitis, wheezing.  Gastrointestinal: Denies dysphagia, odynophagia, heartburn, reflux, water brash, abdominal pain or cramps, nausea, vomiting, bloating, diarrhea, constipation, hematemesis, melena, hematochezia  or hemorrhoids. Genitourinary: Denies dysuria, frequency, urgency, nocturia, hesitancy, discharge, hematuria or flank pain. Musculoskeletal: Denies  arthralgias, myalgias, stiffness, jt. swelling, pain, limping or strain/sprain.  Skin: Denies pruritus, rash, hives, warts, acne, eczema or change in skin lesion(s). Neuro: No weakness, tremor, incoordination, spasms, paresthesia or pain. Psychiatric: Denies confusion, memory loss or sensory loss. Endo: Denies change in weight, skin or hair change.  Heme/Lymph: No excessive bleeding, bruising or enlarged lymph nodes. Exam:  BP 124/84  Pulse 84  Temp98.4 F   Resp 16  Ht 5' 10.75"   Wt 216 lb 3.2 oz   BMI 30.37 O2 Sat 98% on Rm Air Appears well nourished and in no distress. Eyes: PERRLA, EOMs, conjunctiva no swelling or erythema. Sinuses: No frontal/maxillary tenderness ENT/Mouth: EAC's clear, TM's nl w/o erythema, bulging. Nares clear w/o erythema, swelling, exudates. Oropharynx clear without erythema or exudates. Oral hygiene is good. Tongue normal, non obstructing. Hearing intact.  Neck: Supple. Thyroid nl. Car 2+/2+ without bruits, nodes or JVD. Chest: Respirations nl with BS clear & equal w/o rales, rhonchi, wheezing or stridor.  Cor: Heart sounds normal w/ regular rate and rhythm without sig. murmurs, gallops, clicks, or rubs. Peripheral pulses normal and equal  without edema.  Abdomen: Soft & bowel sounds normal. Non-tender w/o guarding, rebound, hernias, masses, or organomegaly.  Lymphatics: Unremarkable.  Musculoskeletal: Full ROM all peripheral extremities, joint stability, 5/5 strength, and normal gait.  Skin: Warm, dry without exposed rashes, lesions or ecchymosis apparent.  Neuro: Cranial nerves intact, reflexes equal bilaterally. Sensory-motor testing grossly intact. Tendon reflexes grossly intact.  Pysch: Alert & oriented x 3.  Insight and judgement nl & appropriate. No ideations.  Assessment and Plan:  1. Hypertension - Continue monitor blood pressure at home. Continue diet/meds same.  2. Hyperlipidemia - Continue diet/meds, exercise,& lifestyle modifications.  Continue monitor periodic cholesterol/liver & renal functions   3. T2_NIDDM Pre-Diabetes - Continue diet, exercise, lifestyle modifications. Monitor appropriate labs.  4. Vitamin D Deficiency - Continue supplementation.  5. Dyspnea - ? Anxiety   - will check CXR   Recommended regular exercise, BP monitoring, weight control, and discussed med and SE's. Recommended labs to assess and monitor clinical status. Further disposition pending results of labs.

## 2014-05-16 NOTE — Patient Instructions (Signed)

## 2014-05-17 ENCOUNTER — Other Ambulatory Visit: Payer: Self-pay | Admitting: Internal Medicine

## 2014-05-17 DIAGNOSIS — E782 Mixed hyperlipidemia: Secondary | ICD-10-CM

## 2014-05-17 LAB — CBC WITH DIFFERENTIAL/PLATELET
Basophils Absolute: 0 10*3/uL (ref 0.0–0.1)
Basophils Relative: 0 % (ref 0–1)
Eosinophils Absolute: 0.1 10*3/uL (ref 0.0–0.7)
Eosinophils Relative: 1 % (ref 0–5)
HCT: 44.9 % (ref 39.0–52.0)
Hemoglobin: 15.1 g/dL (ref 13.0–17.0)
Lymphocytes Relative: 30 % (ref 12–46)
Lymphs Abs: 2.7 10*3/uL (ref 0.7–4.0)
MCH: 30.8 pg (ref 26.0–34.0)
MCHC: 33.6 g/dL (ref 30.0–36.0)
MCV: 91.4 fL (ref 78.0–100.0)
Monocytes Absolute: 0.6 10*3/uL (ref 0.1–1.0)
Monocytes Relative: 7 % (ref 3–12)
Neutro Abs: 5.6 10*3/uL (ref 1.7–7.7)
Neutrophils Relative %: 62 % (ref 43–77)
PLATELETS: 320 10*3/uL (ref 150–400)
RBC: 4.91 MIL/uL (ref 4.22–5.81)
RDW: 12.7 % (ref 11.5–15.5)
WBC: 9 10*3/uL (ref 4.0–10.5)

## 2014-05-17 LAB — BASIC METABOLIC PANEL WITH GFR
BUN: 12 mg/dL (ref 6–23)
CALCIUM: 9.9 mg/dL (ref 8.4–10.5)
CO2: 24 mEq/L (ref 19–32)
Chloride: 99 mEq/L (ref 96–112)
Creat: 0.93 mg/dL (ref 0.50–1.35)
GFR, Est African American: >89 mL/min
GFR, Est Non African American: >89 mL/min
GLUCOSE: 88 mg/dL (ref 70–99)
Potassium: 4.5 mEq/L (ref 3.5–5.3)
Sodium: 137 mEq/L (ref 135–145)

## 2014-05-17 LAB — HEPATIC FUNCTION PANEL
ALBUMIN: 4.7 g/dL (ref 3.5–5.2)
ALT: 32 U/L (ref 0–53)
AST: 28 U/L (ref 0–37)
Alkaline Phosphatase: 49 U/L (ref 39–117)
BILIRUBIN TOTAL: 0.7 mg/dL (ref 0.2–1.2)
Bilirubin, Direct: 0.1 mg/dL (ref 0.0–0.3)
Indirect Bilirubin: 0.6 mg/dL (ref 0.2–1.2)
TOTAL PROTEIN: 7.6 g/dL (ref 6.0–8.3)

## 2014-05-17 LAB — TSH: TSH: 2.181 u[IU]/mL (ref 0.350–4.500)

## 2014-05-17 LAB — MAGNESIUM: MAGNESIUM: 2 mg/dL (ref 1.5–2.5)

## 2014-05-17 LAB — LIPID PANEL
Cholesterol: 322 mg/dL — ABNORMAL HIGH (ref 0–200)
HDL: 53 mg/dL (ref >39)
Total CHOL/HDL Ratio: 6.1 Ratio
Triglycerides: 411 mg/dL — ABNORMAL HIGH (ref <150)

## 2014-05-17 LAB — HEMOGLOBIN A1C
Hgb A1c MFr Bld: 5.6 % (ref <5.7)
MEAN PLASMA GLUCOSE: 114 mg/dL (ref <117)

## 2014-05-17 LAB — INSULIN, FASTING: Insulin fasting, serum: 7.1 u[IU]/mL (ref 2.0–19.6)

## 2014-05-17 LAB — VITAMIN D 25 HYDROXY (VIT D DEFICIENCY, FRACTURES): Vit D, 25-Hydroxy: 57 ng/mL (ref 30–89)

## 2014-05-17 MED ORDER — EZETIMIBE 10 MG PO TABS: ORAL_TABLET | ORAL | Status: DC

## 2014-06-01 ENCOUNTER — Ambulatory Visit: Payer: Self-pay | Admitting: Physician Assistant

## 2014-06-04 ENCOUNTER — Other Ambulatory Visit: Payer: Self-pay | Admitting: Internal Medicine

## 2014-07-04 ENCOUNTER — Other Ambulatory Visit: Payer: Self-pay | Admitting: Internal Medicine

## 2014-07-06 ENCOUNTER — Other Ambulatory Visit: Payer: Self-pay | Admitting: Physician Assistant

## 2014-07-11 NOTE — Telephone Encounter (Signed)
Called Rx into [facility] 

## 2014-08-10 ENCOUNTER — Encounter: Payer: Self-pay | Admitting: Physician Assistant

## 2014-08-10 ENCOUNTER — Ambulatory Visit (INDEPENDENT_AMBULATORY_CARE_PROVIDER_SITE_OTHER): Payer: BC Managed Care – PPO | Admitting: Physician Assistant

## 2014-08-10 VITALS — BP 108/76 | HR 78 | Temp 98.2°F | Resp 18 | Ht 70.0 in | Wt 223.0 lb

## 2014-08-10 DIAGNOSIS — G8929 Other chronic pain: Secondary | ICD-10-CM

## 2014-08-10 DIAGNOSIS — J029 Acute pharyngitis, unspecified: Secondary | ICD-10-CM

## 2014-08-10 DIAGNOSIS — M545 Low back pain: Secondary | ICD-10-CM

## 2014-08-10 MED ORDER — BENZONATATE 100 MG PO CAPS: 200.0000 mg | ORAL_CAPSULE | Freq: Three times a day (TID) | ORAL | Status: DC | PRN

## 2014-08-10 MED ORDER — AZITHROMYCIN 250 MG PO TABS
ORAL_TABLET | ORAL | Status: AC
Start: 2014-08-10 — End: 2014-08-14

## 2014-08-10 MED ORDER — HYDROCODONE-ACETAMINOPHEN 5-325 MG PO TABS: 1.0000 | ORAL_TABLET | Freq: Four times a day (QID) | ORAL | Status: DC | PRN

## 2014-08-10 NOTE — Progress Notes (Signed)
Subjective:    Patient ID: Matthew Salinas, male    DOB: June 08, 1966, 49 y.o.   MRN: [id number]  Otalgia  There is pain in both ears. This is a new problem. Episode onset: 2 weeks ago. The problem has been waxing and waning. There has been no fever. Associated symptoms include coughing, headaches, rhinorrhea and a sore throat. Pertinent negatives include no ear discharge or rash. He has tried nothing for the symptoms.  Sore Throat  This is a new problem. Episode onset: 2 weeks ago. The problem has been unchanged. There has been no fever. Associated symptoms include congestion, coughing, ear pain, headaches and trouble swallowing. Pertinent negatives include no ear discharge or shortness of breath. Treatments tried: Claritin and Tylenol.  Former Smoker- Quit 2003- 5-10 cigarettes per day since age 19 GFR= >89 on 05/16/14 Review of Systems  Constitutional: Positive for chills. Negative for fever, diaphoresis and fatigue.  HENT: Positive for congestion, ear pain, rhinorrhea, sore throat and trouble swallowing. Negative for ear discharge, postnasal drip, sinus pressure and voice change.   Eyes: Negative.   Respiratory: Positive for cough, chest tightness and wheezing. Negative for shortness of breath.        Non-productive cough  Cardiovascular: Negative.  Negative for chest pain.  Gastrointestinal: Negative.   Genitourinary: Negative.   Musculoskeletal: Positive for back pain.       Patient states he has on going low back pain.  States he has seen Dr. Lynann Bologna before and had MRI.  Patient states Dr. Melford Aase prescribed Norco for pain and only uses it as needed.  He states he mainly uses Tylenol.  Last refilled in June 2015.  He states he only take half tablet of Norco at time.  Patient states Dr. Lynann Bologna told him he had bruising in low back.  Has not seen him since MRI results.  No information in chart about this issue.  Skin: Negative.  Negative for rash.  Neurological: Positive for headaches.  Negative for dizziness and light-headedness.  Psychiatric/Behavioral: Negative.    Past Medical History  Diagnosis Date  . Hypertension   . Hyperlipidemia   . GERD (gastroesophageal reflux disease)   . Vitamin D deficiency   . IBS (irritable bowel syndrome)    Current Outpatient Prescriptions on File Prior to Visit  Medication Sig Dispense Refill  . ALPRAZolam (XANAX XR) 1 MG 24 hr tablet TAKE ONE-HALF TO ONE TABLET BY MOUTH TWICE DAILY AS NEEDED 60 tablet 2  . cholecalciferol (VITAMIN D) 1000 UNITS tablet Take 2,000 Units by mouth daily.    Marland Kitchen lisinopril-hydrochlorothiazide (PRINZIDE,ZESTORETIC) 20-25 MG per tablet TAKE ONE TABLET BY MOUTH IN THE MORNING FOR BLOOD PRESSURE 90 tablet 1  . LORazepam (ATIVAN) 2 MG tablet TAKE ONE-HALF TO ONE TABLET BY MOUTH AT BEDTIME AS NEEDED FOR SLEEP 30 tablet 3  . Multiple Vitamins-Minerals (MULTIVITAMIN PO) Take by mouth.    . ANUCORT-HC 25 MG suppository INSERT ONE SUPPOSITORY RECTALLY THREE TIMES DAILY AS NEEDED (Patient not taking: Reported on 08/10/2014) 30 suppository 1  . esomeprazole (NEXIUM) 40 MG capsule Take 1 capsule (40 mg total) by mouth daily. For acid indigestion (Patient not taking: Reported on 08/10/2014) 30 capsule 99  . HYDROcodone-acetaminophen (NORCO) 5-325 MG per tablet Take 1 tablet by mouth every 6 (six) hours as needed for moderate pain. (Patient not taking: Reported on 08/10/2014) 60 tablet 0  . hydrocortisone (ANUSOL-HC) 2.5 % rectal cream Place 1 application rectally 4 (four) times daily. (Patient not taking: Reported on 08/10/2014)  30 g 99   No current facility-administered medications on file prior to visit.   Allergies  Allergen Reactions  . Lipitor [Atorvastatin]     Myalgias  . Prozac [Fluoxetine Hcl]   . Simvastatin     Myalgias     BP 108/76 mmHg  Pulse 78  Temp(Src) 98.2 F (36.8 C) (Temporal)  Resp 18  Ht 5\' 10"  (1.778 m)  Wt 223 lb (101.152 kg)  BMI 32.00 kg/m2  SpO2 98% Wt Readings from Last 3 Encounters:   08/10/14 223 lb (101.152 kg)  05/16/14 216 lb 3.2 oz (98.068 kg)  01/19/14 213 lb 6.4 oz (96.798 kg)   Objective:   Physical Exam  Constitutional: He is oriented to person, place, and time. He appears well-developed and well-nourished. He has a sickly appearance. No distress.  HENT:  Head: Normocephalic.  Right Ear: Tympanic membrane, external ear and ear canal normal.  Left Ear: Tympanic membrane, external ear and ear canal normal.  Nose: Nose normal. Right sinus exhibits no maxillary sinus tenderness and no frontal sinus tenderness. Left sinus exhibits no maxillary sinus tenderness and no frontal sinus tenderness.  Mouth/Throat: Uvula is midline and mucous membranes are normal. Mucous membranes are not pale and not dry. No trismus in the jaw. No uvula swelling. Posterior oropharyngeal edema and posterior oropharyngeal erythema present. No oropharyngeal exudate or tonsillar abscesses.  Eyes: Conjunctivae, EOM and lids are normal. Pupils are equal, round, and reactive to light. Right eye exhibits no discharge. Left eye exhibits no discharge. No scleral icterus.  Neck: Trachea normal, normal range of motion and phonation normal. Neck supple. No tracheal tenderness present. No tracheal deviation present.  Cardiovascular: Normal rate, regular rhythm, S1 normal, S2 normal, normal heart sounds and normal pulses.  Exam reveals no gallop, no distant heart sounds and no friction rub.   No murmur heard. Pulmonary/Chest: Effort normal and breath sounds normal. No stridor. No respiratory distress. He has no decreased breath sounds. He has no wheezes. He has no rhonchi. He has no rales. He exhibits no tenderness.  Abdominal: Soft. Bowel sounds are normal. There is no tenderness. There is no rebound and no guarding.  Musculoskeletal: Normal range of motion.       Lumbar back: He exhibits tenderness and pain. He exhibits normal range of motion.  Bilateral SI joint pain upon palpation.  Lymphadenopathy:        Head (right side): Tonsillar adenopathy present.       Head (left side): Tonsillar adenopathy present.  Tonsils at +3 station bilaterally and tender upon palpation.  No other tenderness or LAD.  Neurological: He is alert and oriented to person, place, and time. No cranial nerve deficit. Gait normal.  Skin: Skin is warm, dry and intact. No rash noted. He is not diaphoretic. No pallor.  Psychiatric: He has a normal mood and affect. His speech is normal and behavior is normal. Judgment and thought content normal. Cognition and memory are normal.  Vitals reviewed.  Assessment & Plan:  1. Acute pharyngitis, unspecified pharyngitis type -Take Z-Pak as prescribed- azithromycin (ZITHROMAX) 250 MG tablet; Take 2 tablets PO on day 1, then 1 tablet PO Q24H x 4 days  Dispense: 6 tablet; Refill: 0 -Take Tessalon Perles as prescribed for cough- benzonatate (TESSALON PERLES) 100 MG capsule; Take 2 capsules (200 mg total) by mouth 3 (three) times daily as needed for cough (Max: 600mg  per day (6 capsules per day)).  Dispense: 120 capsule; Refill: 0  2. Chronic low back  pain -Told patient I will refill ONLY ONCE and that he needs to see pain specialist for chronic pain- HYDROcodone-acetaminophen (NORCO) 5-325 MG per tablet; Take 1 tablet by mouth every 6 (six) hours as needed for moderate pain.  Dispense: 60 tablet; Refill: 0 - Ambulatory referral to [facility] pain specialist  Will let patient know that we will only be prescribing one benzo for him to take.  Will ask him which one works better for him.  Then will discontinue the other one.  Patient is currently prescribed Xanax and Ativan.  Xanax for during the day and Ativan for sleep.  Patient can only be on one per Dr. Melford Aase.  Will let patient know that if he is drinking alcohol- 2 drinks per day, then we will no longer write his benzos.  Discussed medication effects and SE's.  Pt agreed to treatment plan. If you are not feeling better in 10-14  days, then please call the office. Please keep your follow up appt on 10/26/14.  Nikiyah Fackler, Stephani Police, PA-C 10:17 AM [facility]

## 2014-08-10 NOTE — Patient Instructions (Addendum)
-  Please take Z-Pak as prescribed. -Take Tessalon Perles as prescribed for cough  Please make sure you are drinking plenty of water to stay hydrated.  I will refill Norco ONE time, but you need to see pain specialist.  Sent referral to [facility] pain specialist.  If you are not feeling better in 10-14 days, then please call the office. Please keep appt on 10/26/14.   Pharyngitis Pharyngitis is redness, pain, and swelling (inflammation) of your pharynx.  CAUSES  Pharyngitis is usually caused by infection. Most of the time, these infections are from viruses (viral) and are part of a cold. However, sometimes pharyngitis is caused by bacteria (bacterial). Pharyngitis can also be caused by allergies. Viral pharyngitis may be spread from person to person by coughing, sneezing, and personal items or utensils (cups, forks, spoons, toothbrushes). Bacterial pharyngitis may be spread from person to person by more intimate contact, such as kissing.  SIGNS AND SYMPTOMS  Symptoms of pharyngitis include:   Sore throat.   Tiredness (fatigue).   Low-grade fever.   Headache.  Joint pain and muscle aches.  Skin rashes.  Swollen lymph nodes.  Plaque-like film on throat or tonsils (often seen with bacterial pharyngitis). DIAGNOSIS  Your health care provider will ask you questions about your illness and your symptoms. Your medical history, along with a physical exam, is often all that is needed to diagnose pharyngitis. Sometimes, a rapid strep test is done. Other lab tests may also be done, depending on the suspected cause.  TREATMENT  Viral pharyngitis will usually get better in 3-4 days without the use of medicine. Bacterial pharyngitis is treated with medicines that kill germs (antibiotics).  HOME CARE INSTRUCTIONS   Drink enough water and fluids to keep your urine clear or pale yellow.   Only take over-the-counter or prescription medicines as directed by your health care provider:   If  you are prescribed antibiotics, make sure you finish them even if you start to feel better.   Do not take aspirin.   Get lots of rest.   Gargle with 8 oz of salt water ( tsp of salt per 1 qt of water) as often as every 1-2 hours to soothe your throat.   Throat lozenges (if you are not at risk for choking) or sprays may be used to soothe your throat. SEEK MEDICAL CARE IF:   You have large, tender lumps in your neck.  You have a rash.  You cough up green, yellow-brown, or bloody spit. SEEK IMMEDIATE MEDICAL CARE IF:   Your neck becomes stiff.  You drool or are unable to swallow liquids.  You vomit or are unable to keep medicines or liquids down.  You have severe pain that does not go away with the use of recommended medicines.  You have trouble breathing (not caused by a stuffy nose). MAKE SURE YOU:   Understand these instructions.  Will watch your condition.  Will get help right away if you are not doing well or get worse. Document Released: 07/22/2005 Document Revised: 05/12/2013 Document Reviewed: 03/29/2013 [facility] Patient Information 2015 [place], [place]. This information is not intended to replace advice given to you by your health care provider. Make sure you discuss any questions you have with your health care provider.

## 2014-08-11 ENCOUNTER — Telehealth: Payer: Self-pay | Admitting: *Deleted

## 2014-08-11 NOTE — Telephone Encounter (Signed)
-----   Message from Charolette Forward, PA-C sent at 08/10/2014  8:23 PM EST ----- Regarding: Benzo Please call patient and let him know that we are only going to prescribe him one benzo from now on per Dr. Melford Aase.  He needs to decide which one works better (Xanax or Ativan)?  Need to know how much alcohol he is drinking?  If it is more than 2 drinks per day, then we will not prescribe Benzos to him at all.  Thanks,   [employer], PA-C

## 2014-08-11 NOTE — Telephone Encounter (Signed)
Spoke with patient and he states he does not need any refills at this time on either Rx.  He does not take the Rxs together.

## 2014-10-07 ENCOUNTER — Other Ambulatory Visit: Payer: Self-pay | Admitting: Physician Assistant

## 2014-10-10 ENCOUNTER — Other Ambulatory Visit: Payer: Self-pay | Admitting: Physician Assistant

## 2014-10-17 ENCOUNTER — Ambulatory Visit (INDEPENDENT_AMBULATORY_CARE_PROVIDER_SITE_OTHER): Payer: BC Managed Care – PPO | Admitting: Physician Assistant

## 2014-10-17 ENCOUNTER — Other Ambulatory Visit: Payer: Self-pay

## 2014-10-17 ENCOUNTER — Encounter: Payer: Self-pay | Admitting: Physician Assistant

## 2014-10-17 VITALS — BP 110/70 | HR 80 | Temp 97.7°F | Resp 16 | Ht 70.0 in | Wt 213.0 lb

## 2014-10-17 DIAGNOSIS — G4730 Sleep apnea, unspecified: Secondary | ICD-10-CM

## 2014-10-17 DIAGNOSIS — R7309 Other abnormal glucose: Secondary | ICD-10-CM

## 2014-10-17 DIAGNOSIS — K2100 Gastro-esophageal reflux disease with esophagitis, without bleeding: Secondary | ICD-10-CM

## 2014-10-17 DIAGNOSIS — I10 Essential (primary) hypertension: Secondary | ICD-10-CM

## 2014-10-17 DIAGNOSIS — E782 Mixed hyperlipidemia: Secondary | ICD-10-CM

## 2014-10-17 DIAGNOSIS — G8929 Other chronic pain: Secondary | ICD-10-CM

## 2014-10-17 DIAGNOSIS — Z79899 Other long term (current) drug therapy: Secondary | ICD-10-CM

## 2014-10-17 DIAGNOSIS — R7303 Prediabetes: Secondary | ICD-10-CM

## 2014-10-17 DIAGNOSIS — M545 Low back pain: Secondary | ICD-10-CM

## 2014-10-17 DIAGNOSIS — E559 Vitamin D deficiency, unspecified: Secondary | ICD-10-CM

## 2014-10-17 LAB — LIPID PANEL
CHOL/HDL RATIO: 5.7 ratio
Cholesterol: 295 mg/dL — ABNORMAL HIGH (ref 0–200)
HDL: 52 mg/dL (ref >=40)
LDL Cholesterol: 184 mg/dL — ABNORMAL HIGH (ref 0–99)
Triglycerides: 295 mg/dL — ABNORMAL HIGH (ref <150)
VLDL: 59 mg/dL — AB (ref 0–40)

## 2014-10-17 LAB — CBC WITH DIFFERENTIAL/PLATELET
BASOS ABS: 0.1 10*3/uL (ref 0.0–0.1)
BASOS PCT: 1 % (ref 0–1)
EOS PCT: 1 % (ref 0–5)
Eosinophils Absolute: 0.1 10*3/uL (ref 0.0–0.7)
HEMATOCRIT: 43 % (ref 39.0–52.0)
Hemoglobin: 14.5 g/dL (ref 13.0–17.0)
Lymphocytes Relative: 35 % (ref 12–46)
Lymphs Abs: 2 10*3/uL (ref 0.7–4.0)
MCH: 30.9 pg (ref 26.0–34.0)
MCHC: 33.7 g/dL (ref 30.0–36.0)
MCV: 91.7 fL (ref 78.0–100.0)
MONOS PCT: 7 % (ref 3–12)
MPV: 10.2 fL (ref 8.6–12.4)
Monocytes Absolute: 0.4 10*3/uL (ref 0.1–1.0)
Neutro Abs: 3.2 10*3/uL (ref 1.7–7.7)
Neutrophils Relative %: 56 % (ref 43–77)
PLATELETS: 329 10*3/uL (ref 150–400)
RBC: 4.69 MIL/uL (ref 4.22–5.81)
RDW: 12.9 % (ref 11.5–15.5)
WBC: 5.7 10*3/uL (ref 4.0–10.5)

## 2014-10-17 LAB — HEMOGLOBIN A1C
Hgb A1c MFr Bld: 5.6 % (ref <5.7)
Mean Plasma Glucose: 114 mg/dL (ref <117)

## 2014-10-17 LAB — BASIC METABOLIC PANEL WITH GFR
BUN: 12 mg/dL (ref 6–23)
CO2: 27 mEq/L (ref 19–32)
CREATININE: 0.87 mg/dL (ref 0.50–1.35)
Calcium: 9.5 mg/dL (ref 8.4–10.5)
Chloride: 100 mEq/L (ref 96–112)
GFR, Est Non African American: >89 mL/min
GLUCOSE: 91 mg/dL (ref 70–99)
POTASSIUM: 4.4 meq/L (ref 3.5–5.3)
Sodium: 138 mEq/L (ref 135–145)

## 2014-10-17 LAB — HEPATIC FUNCTION PANEL
ALBUMIN: 4.5 g/dL (ref 3.5–5.2)
ALK PHOS: 50 U/L (ref 39–117)
ALT: 28 U/L (ref 0–53)
AST: 21 U/L (ref 0–37)
BILIRUBIN TOTAL: 0.7 mg/dL (ref 0.2–1.2)
Bilirubin, Direct: 0.1 mg/dL (ref 0.0–0.3)
Indirect Bilirubin: 0.6 mg/dL (ref 0.2–1.2)
Total Protein: 7.3 g/dL (ref 6.0–8.3)

## 2014-10-17 LAB — MAGNESIUM: MAGNESIUM: 2 mg/dL (ref 1.5–2.5)

## 2014-10-17 LAB — TSH: TSH: 1.575 u[IU]/mL (ref 0.350–4.500)

## 2014-10-17 MED ORDER — OMEPRAZOLE 40 MG PO CPDR: 40.0000 mg | DELAYED_RELEASE_CAPSULE | Freq: Every day | ORAL | Status: DC

## 2014-10-17 NOTE — Patient Instructions (Addendum)
I think it is possible that you have sleep apnea. It can cause interrupted sleep, headaches, frequent awakenings, fatigue, dry mouth, fast/slow heart beats, memory issues, anxiety/depression, swelling, numbness tingling hands/feet, weight gain, shortness of breath, and the list goes on. Sleep apnea needs to be ruled out because if it is left untreated it does eventually lead to abnormal heart beats, lung failure or heart failure as well as increasing the risk of heart attack and stroke. There are masks you can wear OR a mouth piece that I can give you information about. Often times though people feel MUCH better after getting treatment.   Sleep Apnea  Sleep apnea is a sleep disorder characterized by abnormal pauses in breathing while you sleep. When your breathing pauses, the level of oxygen in your blood decreases. This causes you to move out of deep sleep and into light sleep. As a result, your quality of sleep is poor, and the system that carries your blood throughout your body (cardiovascular system) experiences stress. If sleep apnea remains untreated, the following conditions can develop:  High blood pressure (hypertension).  Coronary artery disease.  Inability to achieve or maintain an erection (impotence).  Impairment of your thought process (cognitive dysfunction). There are three types of sleep apnea: 1. Obstructive sleep apnea--Pauses in breathing during sleep because of a blocked airway. 2. Central sleep apnea--Pauses in breathing during sleep because the area of the brain that controls your breathing does not send the correct signals to the muscles that control breathing. 3. Mixed sleep apnea--A combination of both obstructive and central sleep apnea.  RISK FACTORS The following risk factors can increase your risk of developing sleep apnea:  Being overweight.  Smoking.  Having narrow passages in your nose and throat.  Being of older age.  Being male.  Alcohol use.   Sedative and tranquilizer use.  Ethnicity. Among individuals younger than 35 years, African Americans are at increased risk of sleep apnea. SYMPTOMS   Difficulty staying asleep.  Daytime sleepiness and fatigue.  Loss of energy.  Irritability.  Loud, heavy snoring.  Morning headaches.  Trouble concentrating.  Forgetfulness.  Decreased interest in sex. DIAGNOSIS  In order to diagnose sleep apnea, your caregiver will perform a physical examination. Your caregiver may suggest that you take a home sleep test. Your caregiver may also recommend that you spend the night in a sleep lab. In the sleep lab, several monitors record information about your heart, lungs, and brain while you sleep. Your leg and arm movements and blood oxygen level are also recorded. TREATMENT The following actions may help to resolve mild sleep apnea:  Sleeping on your side.   Using a decongestant if you have nasal congestion.   Avoiding the use of depressants, including alcohol, sedatives, and narcotics.   Losing weight and modifying your diet if you are overweight. There also are devices and treatments to help open your airway:  Oral appliances. These are custom-made mouthpieces that shift your lower jaw forward and slightly open your bite. This opens your airway.  Devices that create positive airway pressure. This positive pressure "splints" your airway open to help you breathe better during sleep. The following devices create positive airway pressure:  Continuous positive airway pressure (CPAP) device. The CPAP device creates a continuous level of air pressure with an air pump. The air is delivered to your airway through a mask while you sleep. This continuous pressure keeps your airway open.  Nasal expiratory positive airway pressure (EPAP) device. The EPAP device  creates positive air pressure as you exhale. The device consists of single-use valves, which are inserted into each nostril and held in  place by adhesive. The valves create very little resistance when you inhale but create much more resistance when you exhale. That increased resistance creates the positive airway pressure. This positive pressure while you exhale keeps your airway open, making it easier to breath when you inhale again.  Bilevel positive airway pressure (BPAP) device. The BPAP device is used mainly in patients with central sleep apnea. This device is similar to the CPAP device because it also uses an air pump to deliver continuous air pressure through a mask. However, with the BPAP machine, the pressure is set at two different levels. The pressure when you exhale is lower than the pressure when you inhale.  Surgery. Typically, surgery is only done if you cannot comply with less invasive treatments or if the less invasive treatments do not improve your condition. Surgery involves removing excess tissue in your airway to create a wider passage way. Document Released: 07/12/2002 Document Revised: 11/16/2012 Document Reviewed: 11/28/2011 [facility] Patient Information 2015 [place], [place]. This information is not intended to replace advice given to you by your health care provider. Make sure you discuss any questions you have with your health care provider.     Can call Dr. Toy Cookey to get evaluated for dental sleep appliance. # [phone]  OR you can try Dr. Clearence Ped in [facility] # [phone]  We will send notes. Call and get price quote on both.   Before you even begin to attack a weight-loss plan, it pays to remember this: You are not fat. You have fat. Losing weight isn't about blame or shame; it's simply another achievement to accomplish. Dieting is like any other skill-you have to buckle down and work at it. As long as you act in a smart, reasonable way, you'll ultimately get where you want to be. Here are some weight loss pearls for you.  1. It's Not a Diet. It's a Lifestyle Thinking of a diet as  something you're on and suffering through only for the short term doesn't work. To shed weight and keep it off, you need to make permanent changes to the way you eat. It's OK to indulge occasionally, of course, but if you cut calories temporarily and then revert to your old way of eating, you'll gain back the weight quicker than you can say yo-yo. Use it to lose it. Research shows that one of the best predictors of long-term weight loss is how many pounds you drop in the first month. For that reason, nutritionists often suggest being stricter for the first two weeks of your new eating strategy to build momentum. Cut out added sugar and alcohol and avoid unrefined carbs. After that, figure out how you can reincorporate them in a way that's healthy and maintainable.  2. There's a Right Way to Exercise Working out burns calories and fat and boosts your metabolism by building muscle. But those trying to lose weight are notorious for overestimating the number of calories they burn and underestimating the amount they take in. Unfortunately, your system is biologically programmed to hold on to extra pounds and that means when you start exercising, your body senses the deficit and ramps up its hunger signals. If you're not diligent, you'll eat everything you burn and then some. Use it to lose it. Cardio gets all the exercise glory, but strength and interval training are  the real heroes. They help you build lean muscle, which in turn increases your metabolism and calorie-burning ability 3. Don't Overreact to Mild Hunger Some people have a hard time losing weight because of hunger anxiety. To them, being hungry is bad-something to be avoided at all costs-so they carry snacks with them and eat when they don't need to. Others eat because they're stressed out or bored. While you never want to get to the point of being ravenous (that's when bingeing is likely to happen), a hunger pang, a craving, or the fact that it's 3:00  p.m. should not send you racing for the vending machine or obsessing about the energy bar in your purse. Ideally, you should put off eating until your stomach is growling and it's difficult to concentrate.  Use it to lose it. When you feel the urge to eat, use the HALT method. Ask yourself, Am I really hungry? Or am I angry or anxious, lonely or bored, or tired? If you're still not certain, try the apple test. If you're truly hungry, an apple should seem delicious; if it doesn't, something else is going on. Or you can try drinking water and making yourself busy, if you are still hungry try a healthy snack.  4. Not All Calories Are Created Equal The mechanics of weight loss are pretty simple: Take in fewer calories than you use for energy. But the kind of food you eat makes all the difference. Processed food that's high in saturated fat and refined starch or sugar can cause inflammation that disrupts the hormone signals that tell your brain you're full. The result: You eat a lot more.  Use it to lose it. Clean up your diet. Swap in whole, unprocessed foods, including vegetables, lean protein, and healthy fats that will fill you up and give you the biggest nutritional bang for your calorie buck. In a few weeks, as your brain starts receiving regular hunger and fullness signals once again, you'll notice that you feel less hungry overall and naturally start cutting back on the amount you eat.  5. Protein, Produce, and Plant-Based Fats Are Your Weight-Loss Trinity Here's why eating the three Ps regularly will help you drop pounds. Protein fills you up. You need it to build lean muscle, which keeps your metabolism humming so that you can torch more fat. People in a weight-loss program who ate double the recommended daily allowance for protein (about 110 grams for a 150-pound woman) lost 70 percent of their weight from fat, while people who ate the RDA lost only about 40 percent, one study found. Produce is packed  with filling fiber. "It's very difficult to consume too many calories if you're eating a lot of vegetables. Example: Three cups of broccoli is a lot of food, yet only 93 calories. (Fruit is another story. It can be easy to overeat and can contain a lot of calories from sugar, so be sure to monitor your intake.) Plant-based fats like olive oil and those in avocados and nuts are healthy and extra satiating.  Use it to lose it. Aim to incorporate each of the three Ps into every meal and snack. People who eat protein throughout the day are able to keep weight off, according to a study in the [place] [employer]. In addition to meat, poultry and seafood, good sources are beans, lentils, eggs, tofu, and yogurt. As for fat, keep portion sizes in check by measuring out salad dressing, oil, and nut butters (shoot for one  to two tablespoons). Finally, eat veggies or a little fruit at every meal. People who did that consumed 308 fewer calories but didn't feel any hungrier than when they didn't eat more produce.  7. How You Eat Is As Important As What You Eat In order for your brain to register that you're full, you need to focus on what you're eating. Sit down whenever you eat, preferably at a table. Turn off the TV or computer, put down your phone, and look at your food. Smell it. Chew slowly, and don't put another bite on your fork until you swallow. When women ate lunch this attentively, they consumed 30 percent less when snacking later than those who listened to an audiobook at lunchtime, according to a study in the [place] [employer]. 8. Weighing Yourself Really Works The scale provides the best evidence about whether your efforts are paying off. Seeing the numbers tick up or down or stagnate is motivation to keep going-or to rethink your approach. A 2015 study at [facility] found that daily weigh-ins helped people lose more weight, keep it off, and maintain that loss, even  after two years. Use it to lose it. Step on the scale at the same time every day for the best results. If your weight shoots up several pounds from one weigh-in to the next, don't freak out. Eating a lot of salt the night before or having your period is the likely culprit. The number should return to normal in a day or two. It's a steady climb that you need to do something about. 9. Too Much Stress and Too Little Sleep Are Your Enemies When you're tired and frazzled, your body cranks up the production of cortisol, the stress hormone that can cause carb cravings. Not getting enough sleep also boosts your levels of ghrelin, a hormone associated with hunger, while suppressing leptin, a hormone that signals fullness and satiety. People on a diet who slept only five and a half hours a night for two weeks lost 55 percent less fat and were hungrier than those who slept eight and a half hours, according to a study in the [place]. Use it to lose it. Prioritize sleep, aiming for seven hours or more a night, which research shows helps lower stress. And make sure you're getting quality zzz's. If a snoring spouse or a fidgety cat wakes you up frequently throughout the night, you may end up getting the equivalent of just four hours of sleep, according to a study from [facility]. Keep pets out of the bedroom, and use a white-noise app to drown out snoring. 10. You Will Hit a plateau-And You Can Bust Through It As you slim down, your body releases much less leptin, the fullness hormone.  If you're not strength training, start right now. Building muscle can raise your metabolism to help you overcome a plateau. To keep your body challenged and burning calories, incorporate new moves and more intense intervals into your workouts or add another sweat session to your weekly routine. Alternatively, cut an extra 100 calories or so a day from your diet. Now that you've lost weight, your body  simply doesn't need as much fuel.   Your ears and sinuses are connected by the eustachian tube. When your sinuses are inflamed, this can close off the tube and cause fluid to collect in your middle ear. This can then cause dizziness, popping, clicking, ringing, and echoing in your ears. This is often NOT an  infection and does NOT require antibiotics, it is caused by inflammation so the treatments help the inflammation. This can take a long time to get better so please be patient.  Here are things you can do to help with this: - Try the Flonase or Nasonex. Remember to spray each nostril twice towards the outer part of your eye.  Do not sniff but instead pinch your nose and tilt your head back to help the medicine get into your sinuses.  The best time to do this is at bedtime.Stop if you get blurred vision or nose bleeds.  -While drinking fluids, pinch and hold nose close and swallow, to help open eustachian tubes to drain fluid behind ear drums. -Please pick one of the over the counter allergy medications below and take it once daily for allergies.  It will also help with fluid behind ear drums. Claritin or loratadine cheapest but likely the weakest  Zyrtec or certizine at night because it can make you sleepy The strongest is allegra or fexafinadine  Cheapest at [employer], [employer], [employer] -can use decongestant over the counter, please do not use if you have high blood pressure or certain heart conditions.   if worsening HA, changes vision/speech, imbalance, weakness go to the ER
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
## 2014-10-18 LAB — VITAMIN D 25 HYDROXY (VIT D DEFICIENCY, FRACTURES): Vit D, 25-Hydroxy: 66 ng/mL (ref 30–100)

## 2014-10-26 ENCOUNTER — Ambulatory Visit: Payer: Self-pay | Admitting: Internal Medicine

## 2014-11-03 ENCOUNTER — Encounter: Payer: Self-pay | Admitting: Gastroenterology

## 2014-11-22 ENCOUNTER — Other Ambulatory Visit: Payer: Self-pay | Admitting: Internal Medicine

## 2014-12-18 ENCOUNTER — Other Ambulatory Visit: Payer: Self-pay | Admitting: Internal Medicine

## 2015-01-23 ENCOUNTER — Encounter: Payer: Self-pay | Admitting: Internal Medicine

## 2015-01-27 ENCOUNTER — Other Ambulatory Visit: Payer: Self-pay

## 2015-01-27 MED ORDER — LORATADINE 10 MG PO TABS: 10.0000 mg | ORAL_TABLET | Freq: Every day | ORAL | Status: DC

## 2015-02-07 ENCOUNTER — Encounter: Payer: Self-pay | Admitting: Internal Medicine

## 2015-03-09 ENCOUNTER — Encounter: Payer: Self-pay | Admitting: Internal Medicine

## 2015-03-09 ENCOUNTER — Ambulatory Visit (INDEPENDENT_AMBULATORY_CARE_PROVIDER_SITE_OTHER): Payer: BC Managed Care – PPO | Admitting: Internal Medicine

## 2015-03-09 VITALS — BP 136/86 | HR 72 | Temp 97.7°F | Resp 16 | Ht 70.0 in | Wt 220.2 lb

## 2015-03-09 DIAGNOSIS — E782 Mixed hyperlipidemia: Secondary | ICD-10-CM

## 2015-03-09 DIAGNOSIS — E349 Endocrine disorder, unspecified: Secondary | ICD-10-CM

## 2015-03-09 DIAGNOSIS — R7309 Other abnormal glucose: Secondary | ICD-10-CM

## 2015-03-09 DIAGNOSIS — I10 Essential (primary) hypertension: Secondary | ICD-10-CM

## 2015-03-09 DIAGNOSIS — Z79899 Other long term (current) drug therapy: Secondary | ICD-10-CM

## 2015-03-09 DIAGNOSIS — E559 Vitamin D deficiency, unspecified: Secondary | ICD-10-CM | POA: Diagnosis not present

## 2015-03-09 DIAGNOSIS — E291 Testicular hypofunction: Secondary | ICD-10-CM | POA: Diagnosis not present

## 2015-03-09 DIAGNOSIS — Z0001 Encounter for general adult medical examination with abnormal findings: Secondary | ICD-10-CM | POA: Diagnosis not present

## 2015-03-09 DIAGNOSIS — Z1212 Encounter for screening for malignant neoplasm of rectum: Secondary | ICD-10-CM

## 2015-03-09 DIAGNOSIS — Z130 Encounter for screening for diseases of the blood and blood-forming organs and certain disorders involving the immune mechanism: Secondary | ICD-10-CM | POA: Diagnosis not present

## 2015-03-09 DIAGNOSIS — Z125 Encounter for screening for malignant neoplasm of prostate: Secondary | ICD-10-CM | POA: Diagnosis not present

## 2015-03-09 DIAGNOSIS — R7303 Prediabetes: Secondary | ICD-10-CM

## 2015-03-09 DIAGNOSIS — Z9181 History of falling: Secondary | ICD-10-CM

## 2015-03-09 DIAGNOSIS — E663 Overweight: Secondary | ICD-10-CM

## 2015-03-09 LAB — CBC WITH DIFFERENTIAL/PLATELET
BASOS PCT: 1 % (ref 0–1)
Basophils Absolute: 0.1 10*3/uL (ref 0.0–0.1)
Eosinophils Absolute: 0.1 10*3/uL (ref 0.0–0.7)
Eosinophils Relative: 2 % (ref 0–5)
HCT: 41.4 % (ref 39.0–52.0)
HEMOGLOBIN: 14.2 g/dL (ref 13.0–17.0)
LYMPHS ABS: 2.3 10*3/uL (ref 0.7–4.0)
Lymphocytes Relative: 43 % (ref 12–46)
MCH: 31.3 pg (ref 26.0–34.0)
MCHC: 34.3 g/dL (ref 30.0–36.0)
MCV: 91.2 fL (ref 78.0–100.0)
MONO ABS: 0.4 10*3/uL (ref 0.1–1.0)
MPV: 10 fL (ref 8.6–12.4)
Monocytes Relative: 8 % (ref 3–12)
NEUTROS ABS: 2.5 10*3/uL (ref 1.7–7.7)
NEUTROS PCT: 46 % (ref 43–77)
PLATELETS: 310 10*3/uL (ref 150–400)
RBC: 4.54 MIL/uL (ref 4.22–5.81)
RDW: 12.8 % (ref 11.5–15.5)
WBC: 5.4 10*3/uL (ref 4.0–10.5)

## 2015-03-09 LAB — TSH: TSH: 1.588 u[IU]/mL (ref 0.350–4.500)

## 2015-03-09 LAB — MAGNESIUM: Magnesium: 1.9 mg/dL (ref 1.5–2.5)

## 2015-03-09 LAB — BASIC METABOLIC PANEL WITH GFR
BUN: 14 mg/dL (ref 7–25)
CO2: 26 mmol/L (ref 20–31)
CREATININE: 0.82 mg/dL (ref 0.70–1.33)
Calcium: 9.2 mg/dL (ref 8.6–10.3)
Chloride: 102 mmol/L (ref 98–110)
GFR, Est African American: >89 mL/min (ref >=60)
GFR, Est Non African American: >89 mL/min (ref >=60)
GLUCOSE: 90 mg/dL (ref 65–99)
POTASSIUM: 4.4 mmol/L (ref 3.5–5.3)
SODIUM: 139 mmol/L (ref 135–146)

## 2015-03-09 LAB — LIPID PANEL
CHOLESTEROL: 283 mg/dL — AB (ref 125–200)
HDL: 43 mg/dL (ref >=40)
LDL Cholesterol: 163 mg/dL — ABNORMAL HIGH (ref <130)
TRIGLYCERIDES: 386 mg/dL — AB (ref <150)
Total CHOL/HDL Ratio: 6.6 Ratio — ABNORMAL HIGH (ref <=5.0)
VLDL: 77 mg/dL — ABNORMAL HIGH (ref <30)

## 2015-03-09 LAB — HEPATIC FUNCTION PANEL
ALK PHOS: 58 U/L (ref 40–115)
ALT: 42 U/L (ref 9–46)
AST: 28 U/L (ref 10–35)
Albumin: 3.9 g/dL (ref 3.6–5.1)
BILIRUBIN INDIRECT: 0.5 mg/dL (ref 0.2–1.2)
Bilirubin, Direct: 0.1 mg/dL (ref <=0.2)
TOTAL PROTEIN: 6.7 g/dL (ref 6.1–8.1)
Total Bilirubin: 0.6 mg/dL (ref 0.2–1.2)

## 2015-03-09 LAB — HEMOGLOBIN A1C
HEMOGLOBIN A1C: 5.5 % (ref <5.7)
MEAN PLASMA GLUCOSE: 111 mg/dL (ref <117)

## 2015-03-09 LAB — IRON AND TIBC
%SAT: 44 % (ref 20–55)
IRON: 151 ug/dL (ref 42–165)
TIBC: 342 ug/dL (ref 215–435)
UIBC: 191 ug/dL (ref 125–400)

## 2015-03-09 LAB — VITAMIN B12: Vitamin B-12: 832 pg/mL (ref 211–911)

## 2015-03-09 MED ORDER — TRAMADOL HCL 50 MG PO TABS: 50.0000 mg | ORAL_TABLET | Freq: Four times a day (QID) | ORAL | Status: DC | PRN

## 2015-03-09 MED ORDER — CYCLOBENZAPRINE HCL 10 MG PO TABS: 10.0000 mg | ORAL_TABLET | Freq: Three times a day (TID) | ORAL | Status: DC | PRN

## 2015-03-09 NOTE — Patient Instructions (Signed)

## 2015-03-09 NOTE — Progress Notes (Signed)
Patient ID: Matthew Salinas, male   DOB: 1966-04-09, 49 y.o.   MRN: [id number]  Complete Physical  Assessment and Plan:   1. Encounter for general adult medical examination with abnormal findings  - CBC with Differential/Platelet - BASIC METABOLIC PANEL WITH GFR - Hepatic function panel - Magnesium - Lipid panel - Hemoglobin A1c - Insulin, random - PSA - Iron and TIBC - Vitamin B12 - Testosterone - Urinalysis, Routine w reflex microscopic (not at [facility]) - Microalbumin / creatinine urine ratio - EKG 12-Lead - [place], RETROPERITNL ABD,  LTD - POC Hemoccult Bld/Stl (3-Cd Home Screen); Future - Vit D  25 hydroxy (rtn osteoporosis monitoring) - TSH  2. Essential hypertension -monitor BP at home if BP greater than 140/85 consistently than take other half of prinzide - Urinalysis, Routine w reflex microscopic (not at [facility]) - Microalbumin / creatinine urine ratio - EKG 12-Lead - [place], RETROPERITNL ABD,  LTD - TSH  3. Prediabetes -well controlled with diet and exercise - Hemoglobin A1c - Insulin, random  4. Hyperlipidemia -cont diet and exercise - Lipid panel  5. Vitamin D deficiency -cont supplement - Vit D  25 hydroxy (rtn osteoporosis monitoring)  6. Medication management  - CBC with Differential/Platelet - BASIC METABOLIC PANEL WITH GFR - Hepatic function panel - Magnesium  7. Screening for rectal cancer -referral to Dr. Deatra Ina - POC Hemoccult Bld/Stl (3-Cd Home Screen); Future  8. Screening for prostate cancer  - PSA  9. Testosterone deficiency  - Testosterone  10. Screening for deficiency anemia  - Iron and TIBC - Vitamin B12  11. Chronic back pain -tramadol -flexeril    Discussed med's effects and SE's. Screening labs and tests as requested with regular follow-up as recommended.  HPI Patient presents for a complete physical.   His blood pressure has been controlled at home, today their BP is BP: 136/86 mmHg He does workout. He denies chest  pain, shortness of breath, dizziness.  Patient reports that he has cut his blood pressure medication in half. He reports that his blood pressure when he has been checking it at home has been helpful.  He reports that when he checks it at home it is 116/77.     He is not on cholesterol medication and denies myalgias. His cholesterol is not at goal. The cholesterol last visit was:   Lab Results  Component Value Date   CHOL 295* 10/17/2014   HDL 52 10/17/2014   LDLCALC 184* 10/17/2014   TRIG 295* 10/17/2014   CHOLHDL 5.7 10/17/2014    He has not been working on diet and exercise for prediabetes, he is not on bASA, he is on ACE/ARB and denies foot ulcerations, hyperglycemia, hypoglycemia , increased appetite, nausea, paresthesia of the feet, polydipsia, polyuria, visual disturbances, vomiting and weight loss. Last A1C in the office was:  Lab Results  Component Value Date   HGBA1C 5.6 10/17/2014    Patient is on Vitamin D supplement.   Lab Results  Component Value Date   VD25OH 66 10/17/2014      Last PSA was: Lab Results  Component Value Date   PSA 0.54 01/19/2014  .  Denies BPH symptoms daytime frequency, double voiding, dysuria, hematuria, hesitancy, incontinence, intermittency, nocturia, sensation of incomplete bladder emptying, suprapubic pain, urgency or weak urinary stream.  Patient reports that he is due for colonoscopy.  He reports that he would like to have an endoscopy as well.  He reports that he is still having some trouble with swallowing  and snoring and also occasionally has some reflux.    He reports that he thinks that he has a hemorrhoid currently.  He has been using anusol suppository and also creams.  He reports that it is a little better than it was.  He reports that his lower back has been chronically bothering him.  He reports that he occasionally has some issues where he takes hydrocodone prn for pain.  He reports that "I need a little something for pain from time  to time".  He reports that the flexeril works well.  He has seen ortho.    Current Medications:  Current Outpatient Prescriptions on File Prior to Visit  Medication Sig Dispense Refill  . ALPRAZolam (XANAX XR) 1 MG 24 hr tablet Take 1/2 to 1 tablet 2 to 3 x day if needed for anxiety or sleep 90 tablet 5  . ANUCORT-HC 25 MG suppository INSERT ONE SUPPOSITORY RECTALLY THREE TIMES DAILY AS NEEDED 30 suppository 1  . benzonatate (TESSALON PERLES) 100 MG capsule Take 2 capsules (200 mg total) by mouth 3 (three) times daily as needed for cough (Max: 600mg  per day (6 capsules per day)). 120 capsule 0  . cholecalciferol (VITAMIN D) 1000 UNITS tablet Take 2,000 Units by mouth daily.    Marland Kitchen HYDROcodone-acetaminophen (NORCO) 5-325 MG per tablet Take 1 tablet by mouth every 6 (six) hours as needed for moderate pain. 60 tablet 0  . hydrocortisone (ANUSOL-HC) 2.5 % rectal cream Place 1 application rectally 4 (four) times daily. 30 g 99  . lisinopril-hydrochlorothiazide (PRINZIDE,ZESTORETIC) 20-25 MG per tablet TAKE ONE TABLET BY MOUTH IN THE MORNING FOR  BLOOD  PRESSURE 90 tablet 2  . loratadine (CLARITIN) 10 MG tablet Take 1 tablet (10 mg total) by mouth daily. 90 tablet 99  . Multiple Vitamins-Minerals (MULTIVITAMIN PO) Take by mouth.    Marland Kitchen omeprazole (PRILOSEC) 40 MG capsule Take 1 capsule (40 mg total) by mouth daily. 90 capsule PRN   No current facility-administered medications on file prior to visit.    Health Maintenance:  Immunization History  Administered Date(s) Administered  . DTaP 06/10/2006  . PPD Test 01/19/2014  . Pneumococcal-Unspecified 01/06/2012    Tetanus: 2007 Colonoscopy: 2006 Eye Exam:  [facility] Dentist:  Dr. Justine Null  Patient Care Team: Unk Pinto, MD as PCP - General (Internal Medicine)  Allergies:  Allergies  Allergen Reactions  . Lipitor [Atorvastatin]     Myalgias  . Prozac [Fluoxetine Hcl]   . Simvastatin     Myalgias    Medical History:  Past  Medical History  Diagnosis Date  . Hypertension   . Hyperlipidemia   . GERD (gastroesophageal reflux disease)   . Vitamin D deficiency   . IBS (irritable bowel syndrome)     Surgical History:  Past Surgical History  Procedure Laterality Date  . Chest tube insertion Right 05-15-02    thoracostomy    Family History:  Family History  Problem Relation Age of Onset  . Cancer Mother     lung  . Hypertension Father   . Hyperlipidemia Father   . Dementia Brother     Social History:   History  Substance Use Topics  . Smoking status: Former Smoker    Quit date: 08/05/2001  . Smokeless tobacco: Never Used     Comment: Smoked 5-10 cigarettes per day since age 65  . Alcohol Use: 3.5 oz/week    7 Standard drinks or equivalent per week    Review of Systems:  Review of Systems  Constitutional: Negative for fever, chills and malaise/fatigue.  HENT: Negative for congestion, ear pain and sore throat.   Eyes: Negative.   Respiratory: Negative for cough, shortness of breath and wheezing.   Cardiovascular: Negative for chest pain, palpitations and leg swelling.  Gastrointestinal: Negative for heartburn, abdominal pain, diarrhea, constipation, blood in stool and melena.  Genitourinary: Positive for frequency. Negative for dysuria, urgency and hematuria.  Musculoskeletal: Positive for back pain and joint pain.  Skin: Negative.   Neurological: Negative for dizziness, loss of consciousness and headaches.  Psychiatric/Behavioral: Negative for depression. The patient is not nervous/anxious and does not have insomnia.     Physical Exam: Estimated body mass index is 31.6 kg/(m^2) as calculated from the following:   Height as of this encounter: 5\' 10"  (1.778 m).   Weight as of this encounter: 220 lb 3.2 oz (99.882 kg). BP 136/86 mmHg  Pulse 72  Temp(Src) 97.7 F (36.5 C)  Resp 16  Ht 5\' 10"  (1.778 m)  Wt 220 lb 3.2 oz (99.882 kg)  BMI 31.60 kg/m2  General Appearance: Well nourished,  in no apparent distress.  Eyes: PERRLA, EOMs, conjunctiva no swelling or erythema ENT/Mouth: Ear canals clear bilaterally with no erythema, swelling, discharge.  TMs normal bilaterally with no erythema, bulging, or retractions.  Oropharynx clear and moist with no exudate, swelling, or erythema.  Dentition normal.   Neck: Supple, thyroid normal. No bruits, JVD, cervical adenopathy Respiratory: Respiratory effort normal, BS equal bilaterally without rales, rhonchi, wheezing or stridor.  Cardio: RRR without murmurs, rubs or gallops. Brisk peripheral pulses without edema.  Chest: symmetric, with normal excursions Abdomen: Soft, nontender, no guarding, rebound, hernias, masses, or organomegaly. Genitourinary: Small internal hemorrhoid at 8 oclock that is palpable.  Prostate smooth, non-tender, and non-boggy. Musculoskeletal: Full ROM all peripheral extremities,5/5 strength, and normal gait. Back exam without tenderness to palpation.  Full ROM.   Skin: Warm, dry without rashes, lesions, ecchymosis. Neuro: A&Ox3, Cranial nerves intact, reflexes equal bilaterally. Normal muscle tone, no cerebellar symptoms. Sensation intact.  Psych: Normal affect, Insight and Judgment appropriate.   EKG: WNL no changes.  AORTA SCAN: WNL  Over 40 minutes of exam, counseling, chart review and critical decision making was performed  Loma Sousa Forcucci 10:26 AM [facility]

## 2015-03-10 ENCOUNTER — Other Ambulatory Visit: Payer: Self-pay | Admitting: Internal Medicine

## 2015-03-10 LAB — VITAMIN D 25 HYDROXY (VIT D DEFICIENCY, FRACTURES): VIT D 25 HYDROXY: 34 ng/mL (ref 30–100)

## 2015-03-10 LAB — PSA: PSA: 0.51 ng/mL (ref <=4.00)

## 2015-03-10 LAB — URINALYSIS, ROUTINE W REFLEX MICROSCOPIC
Bilirubin Urine: NEGATIVE
Glucose, UA: NEGATIVE
Hgb urine dipstick: NEGATIVE
Ketones, ur: NEGATIVE
LEUKOCYTES UA: NEGATIVE
Nitrite: NEGATIVE
Protein, ur: NEGATIVE
Specific Gravity, Urine: 1.017 (ref 1.001–1.035)
pH: 7 (ref 5.0–8.0)

## 2015-03-10 LAB — MICROALBUMIN / CREATININE URINE RATIO
Creatinine, Urine: 96.8 mg/dL
Microalb Creat Ratio: 6.2 mg/g (ref 0.0–30.0)
Microalb, Ur: 0.6 mg/dL (ref <2.0)

## 2015-03-10 LAB — INSULIN, RANDOM: Insulin: 4.9 u[IU]/mL (ref 2.0–19.6)

## 2015-03-10 LAB — TESTOSTERONE: Testosterone: 449 ng/dL (ref 300–890)

## 2015-03-10 MED ORDER — EZETIMIBE 10 MG PO TABS: 10.0000 mg | ORAL_TABLET | Freq: Every day | ORAL | Status: DC

## 2015-03-13 ENCOUNTER — Encounter: Payer: Self-pay | Admitting: Gastroenterology

## 2015-03-28 ENCOUNTER — Other Ambulatory Visit: Payer: Self-pay | Admitting: *Deleted

## 2015-03-28 DIAGNOSIS — Z0001 Encounter for general adult medical examination with abnormal findings: Secondary | ICD-10-CM

## 2015-03-28 DIAGNOSIS — Z1212 Encounter for screening for malignant neoplasm of rectum: Secondary | ICD-10-CM

## 2015-03-28 LAB — POC HEMOCCULT BLD/STL (HOME/3-CARD/SCREEN)
Card #2 Fecal Occult Blod, POC: NEGATIVE
Card #3 Fecal Occult Blood, POC: NEGATIVE
Fecal Occult Blood, POC: NEGATIVE

## 2015-04-14 ENCOUNTER — Other Ambulatory Visit: Payer: Self-pay | Admitting: Internal Medicine

## 2015-04-18 ENCOUNTER — Ambulatory Visit (INDEPENDENT_AMBULATORY_CARE_PROVIDER_SITE_OTHER): Payer: BC Managed Care – PPO | Admitting: Internal Medicine

## 2015-04-18 VITALS — BP 122/80 | HR 84 | Temp 98.0°F | Wt 218.0 lb

## 2015-04-18 DIAGNOSIS — K649 Unspecified hemorrhoids: Secondary | ICD-10-CM | POA: Diagnosis not present

## 2015-04-18 MED ORDER — LIDOCAINE-PRILOCAINE 2.5-2.5 % EX CREA: 1.0000 "application " | TOPICAL_CREAM | CUTANEOUS | Status: DC | PRN

## 2015-04-18 NOTE — Patient Instructions (Addendum)
Please make sure that you are having soft stools.  Please make sure that you use a stool softener if you feel like you are constipated.  Please make sure you are avoiding straining with heavy lifting our bowel movements as much as possible.    Hemorrhoids Hemorrhoids are swollen veins around the rectum or anus. There are two types of hemorrhoids:   Internal hemorrhoids. These occur in the veins just inside the rectum. They may poke through to the outside and become irritated and painful.  External hemorrhoids. These occur in the veins outside the anus and can be felt as a painful swelling or hard lump near the anus. CAUSES  Pregnancy.   Obesity.   Constipation or diarrhea.   Straining to have a bowel movement.   Sitting for long periods on the toilet.  Heavy lifting or other activity that caused you to strain.  Anal intercourse. SYMPTOMS   Pain.   Anal itching or irritation.   Rectal bleeding.   Fecal leakage.   Anal swelling.   One or more lumps around the anus.  DIAGNOSIS  Your caregiver may be able to diagnose hemorrhoids by visual examination. Other examinations or tests that may be performed include:   Examination of the rectal area with a gloved hand (digital rectal exam).   Examination of anal canal using a small tube (scope).   A blood test if you have lost a significant amount of blood.  A test to look inside the colon (sigmoidoscopy or colonoscopy). TREATMENT Most hemorrhoids can be treated at home. However, if symptoms do not seem to be getting better or if you have a lot of rectal bleeding, your caregiver may perform a procedure to help make the hemorrhoids get smaller or remove them completely. Possible treatments include:   Placing a rubber band at the base of the hemorrhoid to cut off the circulation (rubber band ligation).   Injecting a chemical to shrink the hemorrhoid (sclerotherapy).   Using a tool to burn the hemorrhoid (infrared  light therapy).   Surgically removing the hemorrhoid (hemorrhoidectomy).   Stapling the hemorrhoid to block blood flow to the tissue (hemorrhoid stapling).  HOME CARE INSTRUCTIONS   Eat foods with fiber, such as whole grains, beans, nuts, fruits, and vegetables. Ask your doctor about taking products with added fiber in them (fibersupplements).  Increase fluid intake. Drink enough water and fluids to keep your urine clear or pale yellow.   Exercise regularly.   Go to the bathroom when you have the urge to have a bowel movement. Do not wait.   Avoid straining to have bowel movements.   Keep the anal area dry and clean. Use wet toilet paper or moist towelettes after a bowel movement.   Medicated creams and suppositories may be used or applied as directed.   Only take over-the-counter or prescription medicines as directed by your caregiver.   Take warm sitz baths for 15-20 minutes, 3-4 times a day to ease pain and discomfort.   Place ice packs on the hemorrhoids if they are tender and swollen. Using ice packs between sitz baths may be helpful.   Put ice in a plastic bag.   Place a towel between your skin and the bag.   Leave the ice on for 15-20 minutes, 3-4 times a day.   Do not use a donut-shaped pillow or sit on the toilet for long periods. This increases blood pooling and pain.  SEEK MEDICAL CARE IF:  You have increasing pain  and swelling that is not controlled by treatment or medicine.  You have uncontrolled bleeding.  You have difficulty or you are unable to have a bowel movement.  You have pain or inflammation outside the area of the hemorrhoids. MAKE SURE YOU:  Understand these instructions.  Will watch your condition.  Will get help right away if you are not doing well or get worse. Document Released: 07/19/2000 Document Revised: 07/08/2012 Document Reviewed: 05/26/2012 [facility] Patient Information 2015 [place], [place]. This information is not  intended to replace advice given to you by your health care provider. Make sure you discuss any questions you have with your health care provider.

## 2015-04-18 NOTE — Progress Notes (Signed)
   Subjective:    Patient ID: Matthew Salinas, male    DOB: 1965-11-22, 49 y.o.   MRN: [id number]  HPI  Patient presents to the office for evaluation of hemorrhoids.  Patient reports that over the last two weeks he has been having really bad hemorrhoids.  He has had hemorrhoids in the past but he has been working out and he has been straining to have bowel movement.  He did use the anusol suppository and has also been staying off his feet and that is not helping at all.  He reports that one was very dark and looked like blood.  He reports that he has been drinking fluids but not using a stool softener.     Review of Systems  Constitutional: Negative for fever, chills and fatigue.  Gastrointestinal: Positive for constipation and rectal pain. Negative for blood in stool and anal bleeding.       Objective:   Physical Exam  Constitutional: He is oriented to person, place, and time. He appears well-developed and well-nourished. No distress.  HENT:  Head: Normocephalic.  Mouth/Throat: Oropharynx is clear and moist. No oropharyngeal exudate.  Eyes: Conjunctivae are normal. No scleral icterus.  Neck: Normal range of motion. Neck supple. No JVD present. No thyromegaly present.  Cardiovascular: Normal rate, regular rhythm, normal heart sounds and intact distal pulses.  Exam reveals no gallop and no friction rub.   No murmur heard. Pulmonary/Chest: Effort normal and breath sounds normal. No respiratory distress. He has no wheezes. He has no rales. He exhibits no tenderness.  Abdominal: Soft. Bowel sounds are normal. He exhibits no distension and no mass. There is no tenderness. There is no rebound and no guarding.  Genitourinary:  External inspection reveals no external thrombosed hemorrhoids.  There is no evidence of frank anal fissures.  Internal exam deferred by patient as he has rectal discomfort.   Musculoskeletal: Normal range of motion.  Lymphadenopathy:    He has no cervical adenopathy.   Neurological: He is alert and oriented to person, place, and time.  Skin: Skin is warm and dry. He is not diaphoretic.  Psychiatric: He has a normal mood and affect. His behavior is normal. Judgment and thought content normal.  Nursing note and vitals reviewed.   Filed Vitals:   04/18/15 1326  BP: 122/80  Pulse: 84  Temp: 98 F (36.7 C)           Assessment & Plan:    1. Hemorrhoids, unspecified hemorrhoid type -use miralax for BM -avoid straining -cont to use anusol daily -lidocaine cream given -sent message to Dr. [place] office to see if we can speed up office visit there.

## 2015-05-05 ENCOUNTER — Encounter: Payer: Self-pay | Admitting: Gastroenterology

## 2015-05-09 ENCOUNTER — Other Ambulatory Visit: Payer: Self-pay | Admitting: Internal Medicine

## 2015-05-19 ENCOUNTER — Ambulatory Visit: Payer: BC Managed Care – PPO | Admitting: Gastroenterology

## 2015-05-22 ENCOUNTER — Encounter: Payer: BC Managed Care – PPO | Admitting: Gastroenterology

## 2015-06-14 ENCOUNTER — Ambulatory Visit (AMBULATORY_SURGERY_CENTER): Payer: Self-pay

## 2015-06-14 VITALS — Ht 71.0 in | Wt 221.0 lb

## 2015-06-14 DIAGNOSIS — Z800 Family history of malignant neoplasm of digestive organs: Secondary | ICD-10-CM

## 2015-06-14 MED ORDER — NA SULFATE-K SULFATE-MG SULF 17.5-3.13-1.6 GM/177ML PO SOLN: 1.0000 | Freq: Once | ORAL | Status: DC

## 2015-06-14 NOTE — Progress Notes (Signed)
No egg or soy allergies Not on home 02 No previous anesthesia complications No diet or weight loss meds 

## 2015-06-22 ENCOUNTER — Encounter: Payer: Self-pay | Admitting: Gastroenterology

## 2015-06-28 ENCOUNTER — Encounter: Payer: Self-pay | Admitting: Gastroenterology

## 2015-06-28 ENCOUNTER — Ambulatory Visit (AMBULATORY_SURGERY_CENTER): Payer: BC Managed Care – PPO | Admitting: Gastroenterology

## 2015-06-28 VITALS — BP 130/92 | HR 87 | Temp 98.5°F | Resp 16 | Ht 71.0 in | Wt 221.0 lb

## 2015-06-28 DIAGNOSIS — Z1211 Encounter for screening for malignant neoplasm of colon: Secondary | ICD-10-CM | POA: Diagnosis not present

## 2015-06-28 DIAGNOSIS — Z800 Family history of malignant neoplasm of digestive organs: Secondary | ICD-10-CM

## 2015-06-28 MED ORDER — SODIUM CHLORIDE 0.9 % IV SOLN: 500.0000 mL | INTRAVENOUS | Status: DC

## 2015-06-28 NOTE — Op Note (Signed)
[place]  [address]. [place], [id number]   COLONOSCOPY PROCEDURE REPORT  PATIENT: Matthew Salinas, Matthew Salinas  MR#: [id number] BIRTHDATE: 1965-10-17 , 49  yrs. old GENDER: male ENDOSCOPIST: Harl Bowie, MD REFERRED [id number] Melford Aase, M.D. PROCEDURE DATE:  06/28/2015 PROCEDURE:   Colonoscopy, screening First Screening Colonoscopy - Avg.  risk and is 50 yrs.  old or older - No.  Prior Negative Screening - Now for repeat screening. 10 or more years since last screening  History of Adenoma - Now for follow-up colonoscopy & has been > or = to 3 yrs.  N/A  Polyps removed today? No Recommend repeat exam, <10 yrs? No ASA CLASS:   Class II INDICATIONS:Screening for colonic neoplasia and Colorectal Neoplasm Risk Assessment for this procedure is average risk. MEDICATIONS: Propofol 400 mg IV  DESCRIPTION OF PROCEDURE:   After the risks benefits and alternatives of the procedure were thoroughly explained, informed consent was obtained.  The digital rectal exam revealed no abnormalities of the rectum.   The [id number]  endoscope was introduced through the anus and advanced to the terminal ileum which was intubated for a short distance. No adverse events experienced.   The quality of the prep was good.  The instrument was then slowly withdrawn as the colon was fully examined. Estimated blood loss is zero unless otherwise noted in this procedure report.   COLON FINDINGS: The examined terminal ileum appeared to be normal. Small external and internal hemorrhoids were found.   There was mild diverticulosis noted throughout the entire examined colon R>L. Retroflexed views revealed internal hemorrhoids. The time to cecum = 27.5 Withdrawal time = 12.4   The scope was withdrawn and the procedure completed. COMPLICATIONS: There were no immediate complications.  ENDOSCOPIC IMPRESSION: 1.   The examined terminal ileum appeared to be normal 2.   Small external and  internal hemorrhoids 3.   There was mild diverticulosis noted throughout the entire examined colon  RECOMMENDATIONS: You should continue to follow colorectal cancer screening guidelines for "routine risk" patients with a repeat colonoscopy in 10 years.   eSigned:  Harl Bowie, MD 06/28/2015 8:35 AM

## 2015-06-28 NOTE — Patient Instructions (Signed)
YOU HAD AN ENDOSCOPIC PROCEDURE TODAY AT [facility]:   Refer to the procedure report that was given to you for any specific questions about what was found during the examination.  If the procedure report does not answer your questions, please call your gastroenterologist to clarify.  If you requested that your care partner not be given the details of your procedure findings, then the procedure report has been included in a sealed envelope for you to review at your convenience later.  YOU SHOULD EXPECT: Some feelings of bloating in the abdomen. Passage of more gas than usual.  Walking can help get rid of the air that was put into your GI tract during the procedure and reduce the bloating. If you had a lower endoscopy (such as a colonoscopy or flexible sigmoidoscopy) you may notice spotting of blood in your stool or on the toilet paper. If you underwent a bowel prep for your procedure, you may not have a normal bowel movement for a few days.  Please Note:  You might notice some irritation and congestion in your nose or some drainage.  This is from the oxygen used during your procedure.  There is no need for concern and it should clear up in a day or so.  SYMPTOMS TO REPORT IMMEDIATELY:   Following lower endoscopy (colonoscopy or flexible sigmoidoscopy):  Excessive amounts of blood in the stool  Significant tenderness or worsening of abdominal pains  Swelling of the abdomen that is new, acute  Fever of 100F or higher  For urgent or emergent issues, a gastroenterologist can be reached at any hour by calling [phone].   DIET: Your first meal following the procedure should be a small meal and then it is ok to progress to your normal diet. Heavy or fried foods are harder to digest and may make you feel nauseous or bloated.  Likewise, meals heavy in dairy and vegetables can increase bloating.  Drink plenty of fluids but you should avoid alcoholic beverages for 24  hours.  ACTIVITY:  You should plan to take it easy for the rest of today and you should NOT DRIVE or use heavy machinery until tomorrow (because of the sedation medicines used during the test).    FOLLOW UP: Our staff will call the number listed on your records the next business day following your procedure to check on you and address any questions or concerns that you may have regarding the information given to you following your procedure. If we do not reach you, we will leave a message.  However, if you are feeling well and you are not experiencing any problems, there is no need to return our call.  We will assume that you have returned to your regular daily activities without incident.  If any biopsies were taken you will be contacted by phone or by letter within the next 1-3 weeks.  Please call us at [phone] if you have not heard about the biopsies in 3 weeks.    SIGNATURES/CONFIDENTIALITY: You and/or your care partner have signed paperwork which will be entered into your electronic medical record.  These signatures attest to the fact that that the information above on your After Visit Summary has been reviewed and is understood.  Full responsibility of the confidentiality of this discharge information lies with you and/or your care-partner.  Next colonoscopy in 10 years. Please review diverticulosis, high fiber diet, and hemorrhoid handouts provided.

## 2015-06-28 NOTE — Progress Notes (Signed)
A/ox3 pleased with MAC, report to Wendy RN 

## 2015-07-03 ENCOUNTER — Telehealth: Payer: Self-pay | Admitting: *Deleted

## 2015-07-03 NOTE — Telephone Encounter (Signed)
No answer, left message to call if questions or concerns. 

## 2015-07-04 ENCOUNTER — Other Ambulatory Visit: Payer: Self-pay | Admitting: Internal Medicine

## 2015-09-07 ENCOUNTER — Other Ambulatory Visit: Payer: Self-pay | Admitting: Internal Medicine

## 2015-09-08 ENCOUNTER — Other Ambulatory Visit: Payer: Self-pay | Admitting: Internal Medicine

## 2015-09-11 ENCOUNTER — Other Ambulatory Visit: Payer: Self-pay | Admitting: Physician Assistant

## 2015-09-19 ENCOUNTER — Encounter: Payer: Self-pay | Admitting: Internal Medicine

## 2015-09-19 ENCOUNTER — Ambulatory Visit (INDEPENDENT_AMBULATORY_CARE_PROVIDER_SITE_OTHER): Payer: BC Managed Care – PPO | Admitting: Internal Medicine

## 2015-09-19 VITALS — BP 126/84 | HR 64 | Temp 97.7°F | Resp 16 | Ht 70.75 in | Wt 226.4 lb

## 2015-09-19 DIAGNOSIS — K219 Gastro-esophageal reflux disease without esophagitis: Secondary | ICD-10-CM | POA: Insufficient documentation

## 2015-09-19 DIAGNOSIS — E559 Vitamin D deficiency, unspecified: Secondary | ICD-10-CM | POA: Diagnosis not present

## 2015-09-19 DIAGNOSIS — F419 Anxiety disorder, unspecified: Secondary | ICD-10-CM | POA: Diagnosis not present

## 2015-09-19 DIAGNOSIS — G4733 Obstructive sleep apnea (adult) (pediatric): Secondary | ICD-10-CM | POA: Diagnosis not present

## 2015-09-19 DIAGNOSIS — R7303 Prediabetes: Secondary | ICD-10-CM | POA: Diagnosis not present

## 2015-09-19 DIAGNOSIS — I10 Essential (primary) hypertension: Secondary | ICD-10-CM | POA: Diagnosis not present

## 2015-09-19 DIAGNOSIS — E782 Mixed hyperlipidemia: Secondary | ICD-10-CM | POA: Diagnosis not present

## 2015-09-19 DIAGNOSIS — Z79899 Other long term (current) drug therapy: Secondary | ICD-10-CM | POA: Diagnosis not present

## 2015-09-19 LAB — LIPID PANEL
CHOL/HDL RATIO: 7.3 ratio — AB (ref <=5.0)
CHOLESTEROL: 299 mg/dL — AB (ref 125–200)
HDL: 41 mg/dL (ref >=40)
Triglycerides: 439 mg/dL — ABNORMAL HIGH (ref <150)

## 2015-09-19 LAB — CBC WITH DIFFERENTIAL/PLATELET
BASOS PCT: 1 % (ref 0–1)
Basophils Absolute: 0.1 10*3/uL (ref 0.0–0.1)
Eosinophils Absolute: 0.1 10*3/uL (ref 0.0–0.7)
Eosinophils Relative: 1 % (ref 0–5)
HCT: 43.9 % (ref 39.0–52.0)
HEMOGLOBIN: 14.7 g/dL (ref 13.0–17.0)
Lymphocytes Relative: 41 % (ref 12–46)
Lymphs Abs: 2.2 10*3/uL (ref 0.7–4.0)
MCH: 31.1 pg (ref 26.0–34.0)
MCHC: 33.5 g/dL (ref 30.0–36.0)
MCV: 92.8 fL (ref 78.0–100.0)
MPV: 10.1 fL (ref 8.6–12.4)
Monocytes Absolute: 0.4 10*3/uL (ref 0.1–1.0)
Monocytes Relative: 7 % (ref 3–12)
NEUTROS ABS: 2.7 10*3/uL (ref 1.7–7.7)
NEUTROS PCT: 50 % (ref 43–77)
Platelets: 303 10*3/uL (ref 150–400)
RBC: 4.73 MIL/uL (ref 4.22–5.81)
RDW: 13 % (ref 11.5–15.5)
WBC: 5.3 10*3/uL (ref 4.0–10.5)

## 2015-09-19 LAB — HEPATIC FUNCTION PANEL
ALT: 35 U/L (ref 9–46)
AST: 23 U/L (ref 10–35)
Albumin: 4.3 g/dL (ref 3.6–5.1)
Alkaline Phosphatase: 58 U/L (ref 40–115)
BILIRUBIN DIRECT: 0.1 mg/dL (ref <=0.2)
BILIRUBIN INDIRECT: 0.7 mg/dL (ref 0.2–1.2)
Total Bilirubin: 0.8 mg/dL (ref 0.2–1.2)
Total Protein: 7.2 g/dL (ref 6.1–8.1)

## 2015-09-19 LAB — BASIC METABOLIC PANEL WITH GFR
BUN: 14 mg/dL (ref 7–25)
CHLORIDE: 97 mmol/L — AB (ref 98–110)
CO2: 26 mmol/L (ref 20–31)
Calcium: 9.6 mg/dL (ref 8.6–10.3)
Creat: 0.9 mg/dL (ref 0.70–1.33)
GFR, Est African American: >89 mL/min (ref >=60)
Glucose, Bld: 92 mg/dL (ref 65–99)
POTASSIUM: 4.4 mmol/L (ref 3.5–5.3)
SODIUM: 137 mmol/L (ref 135–146)

## 2015-09-19 LAB — TSH: TSH: 1.83 mIU/L (ref 0.40–4.50)

## 2015-09-19 LAB — HEMOGLOBIN A1C
HEMOGLOBIN A1C: 5.5 % (ref <5.7)
Mean Plasma Glucose: 111 mg/dL (ref <117)

## 2015-09-19 LAB — MAGNESIUM: Magnesium: 2 mg/dL (ref 1.5–2.5)

## 2015-09-19 NOTE — Patient Instructions (Signed)

## 2015-09-19 NOTE — Progress Notes (Addendum)
Patient ID: Matthew Salinas, male   DOB: Sep 14, 1965, 50 y.o.   MRN: [id number]   This very nice 50 y.o. [name] presents for 6 month follow up with Hypertension, Hyperlipidemia, Pre-Diabetes and Vitamin D Deficiency. Patient reports issues with poor sleep hygiene, insomnia, frequent awakenings, snoring and reported apneic spells.   Patient is treated for HTN since 2005 & BP has been controlled at home. Today's BP: 126/84 mmHg. Patient has had no complaints of any cardiac type chest pain, palpitations, dyspnea/orthopnea/PND, dizziness, claudication, or dependent edema.   Hyperlipidemia (2009) is not controlled with non-compliant diet (Alleged Statin & Zetia Intolerance). Patient had  myalgias on statins. Current Lipids are not at goal with Cholesterol 299*; HDL 41; LDL NOT CALC; and elevatedTriglycerides 439 on   Also, the patient is monitored expectantly for PreDiabetes and has had no symptoms of reactive hypoglycemia, diabetic polys, paresthesias or visual blurring.  Current A1c is at goal with A1c 5.5%.    Further, the patient also has history of Vitamin D Deficiency  of 44 in 2008 and supplements vitamin D sporadically. Last vitamin D was still low at  34 on/11/2014.  Medication Sig  . ALPRAZolam-XR 1 MG 24 hr tablet TAKE ONE-HALF TO ONE TABLET BY MOUTH TWICE TO THREE TIMES DAILY AS NEEDED FOR ANXIETY OR SLEEP  . ANUCORT-HC 25 MG supp UNWRAP AND INSERT ONE SUPPOSITORY RECTALLY THREE TIMES DAILY AS NEEDED  . VITAMIN D 1000 UNITS Take 2,000 Units by mouth daily.  . cyclobenzaprine  10 MG tablet Take 1 tablet (10 mg total) by mouth every 8 (eight) hours as needed for muscle spasms.  . ANUSOL-HC 2.5 % rectal cream Place 1 application rectally 4 (four) times daily.  Marland Kitchen KRILL OIL  Take by mouth.  . lidocaine-prilocaine (EMLA) cream Apply 1 application topically as needed.  Marland Kitchen lisinopril-hctz 20-25 MG  TAKE ONE TABLET BY MOUTH IN THE MORNING FOR BLOOD PRESSURE  . loratadine  10 MG tablet Take 1 tablet (10  mg total) by mouth daily.  . Multiple Vitamins-Minerals  Take by mouth.  Marland Kitchen omeprazole  40 MG capsule Take 1 capsule (40 mg total) by mouth daily.  . traMADol  50 MG tablet Take 1 tablet (50 mg total) by mouth every 6 (six) hours as needed.   Allergies  Allergen Reactions  . Lipitor [Atorvastatin]     Myalgias  . Prozac [Fluoxetine Hcl]   . Simvastatin     Myalgias   PMHx:   Past Medical History  Diagnosis Date  . Hypertension   . Hyperlipidemia   . GERD (gastroesophageal reflux disease)   . Vitamin D deficiency   . IBS (irritable bowel syndrome)   . Allergy    Immunization History  Administered Date(s) Administered  . DTaP 06/10/2006  . PPD Test 01/19/2014  . Pneumococcal-Unspecified 01/06/2012   Past Surgical History  Procedure Laterality Date  . Chest tube insertion Right 05-15-02    thoracostomy  . Toe surgery  2008    right big toe   FHx:    Reviewed / unchanged  SHx:    Reviewed / unchanged  Systems Review:  Constitutional: Denies fever, chills, wt changes, headaches, insomnia, fatigue, night sweats, change in appetite. Eyes: Denies redness, blurred vision, diplopia, discharge, itchy, watery eyes.  ENT: Denies discharge, congestion, post nasal drip, epistaxis, sore throat, earache, hearing loss, dental pain, tinnitus, vertigo, sinus pain, snoring.  CV: Denies chest pain, palpitations, irregular heartbeat, syncope, dyspnea, diaphoresis, orthopnea, PND, claudication or edema. Respiratory: denies  cough, dyspnea, DOE, pleurisy, hoarseness, laryngitis, wheezing.  Gastrointestinal: Denies dysphagia, odynophagia, heartburn, reflux, water brash, abdominal pain or cramps, nausea, vomiting, bloating, diarrhea, constipation, hematemesis, melena, hematochezia  or hemorrhoids. Genitourinary: Denies dysuria, frequency, urgency, nocturia, hesitancy, discharge, hematuria or flank pain. Musculoskeletal: Denies arthralgias, myalgias, stiffness, jt. swelling, pain, limping or  strain/sprain.  Skin: Denies pruritus, rash, hives, warts, acne, eczema or change in skin lesion(s). Neuro: No weakness, tremor, incoordination, spasms, paresthesia or pain. Psychiatric: Denies confusion, memory loss or sensory loss. Endo: Denies change in weight, skin or hair change.  Heme/Lymph: No excessive bleeding, bruising or enlarged lymph nodes.  Physical Exam  BP 126/84 mmHg  Pulse 64  Temp(Src) 97.7 F (36.5 C)  Resp 16  Ht 5' 10.75" (1.797 m)  Wt 226 lb 6.4 oz (102.694 kg)  BMI 31.80 kg/m2  Appears well nourished and in no distress. Eyes: PERRLA, EOMs, conjunctiva no swelling or erythema. Sinuses: No frontal/maxillary tenderness ENT/Mouth: EAC's clear, TM's nl w/o erythema, bulging. Nares clear w/o erythema, swelling, exudates. Oropharynx clear without erythema or exudates. Oral hygiene is good. Tongue normal, non obstructing. Hearing intact.  Neck: Supple. Thyroid nl. Car 2+/2+ without bruits, nodes or JVD. Chest: Respirations nl with BS clear & equal w/o rales, rhonchi, wheezing or stridor.  Cor: Heart sounds normal w/ regular rate and rhythm without sig. murmurs, gallops, clicks, or rubs. Peripheral pulses normal and equal  without edema.  Abdomen: Soft & bowel sounds normal. Non-tender w/o guarding, rebound, hernias, masses, or organomegaly.  Lymphatics: Unremarkable.  Musculoskeletal: Full ROM all peripheral extremities, joint stability, 5/5 strength, and normal gait.  Skin: Warm, dry without exposed rashes, lesions or ecchymosis apparent.  Neuro: Cranial nerves intact, reflexes equal bilaterally. Sensory-motor testing grossly intact. Tendon reflexes grossly intact.  Pysch: Alert & oriented x 3.  Insight and judgement nl & appropriate. No ideations.  Assessment and Plan:  1. Essential hypertension  - TSH  2. Hyperlipidemia  - Lipid panel - TSH  3. Prediabetes  - Hemoglobin A1c - Insulin, random  4. Vitamin D deficiency  - VITAMIN D 25 Hydroxy  5.  Gastroesophageal reflux disease   6. Chronic anxiety   7. Medication management  - CBC with Differential/Platelet - BASIC METABOLIC PANEL WITH GFR - Hepatic function panel - Magnesium - Lipid panel  8. OSA, suspected  - sleep study   Recommended regular exercise, BP monitoring, weight control, and discussed med and SE's. Recommended labs to assess and monitor clinical status. Further disposition pending results of labs. Over 30 minutes of exam, counseling, chart review was performed

## 2015-09-20 ENCOUNTER — Other Ambulatory Visit: Payer: Self-pay | Admitting: Internal Medicine

## 2015-09-20 LAB — VITAMIN D 25 HYDROXY (VIT D DEFICIENCY, FRACTURES): Vit D, 25-Hydroxy: 42 ng/mL (ref 30–100)

## 2015-09-20 LAB — INSULIN, RANDOM: Insulin: 4.7 u[IU]/mL (ref 2.0–19.6)

## 2015-09-20 MED ORDER — ROSUVASTATIN CALCIUM 40 MG PO TABS: ORAL_TABLET | ORAL | Status: DC

## 2015-09-21 ENCOUNTER — Telehealth: Payer: Self-pay | Admitting: Internal Medicine

## 2015-09-21 NOTE — Telephone Encounter (Signed)
Patient called and requested a referral for Sleep Study. Patient states he discussed with you at his 09-19-15 office visit. Please advise.  Thank you, Leonie Ike Referral Coordinator  [facility] [phone] ext. [phone] Fax [phone]

## 2015-09-21 NOTE — Addendum Note (Signed)
Addended by: Unk Pinto on: 09/21/2015 10:26 AM   Modules accepted: Orders

## 2015-10-02 ENCOUNTER — Institutional Professional Consult (permissible substitution): Payer: BC Managed Care – PPO | Admitting: Neurology

## 2015-10-18 ENCOUNTER — Institutional Professional Consult (permissible substitution): Payer: BC Managed Care – PPO | Admitting: Neurology

## 2015-11-08 ENCOUNTER — Other Ambulatory Visit: Payer: Self-pay | Admitting: Internal Medicine

## 2015-11-10 ENCOUNTER — Other Ambulatory Visit: Payer: Self-pay | Admitting: Internal Medicine

## 2015-11-14 ENCOUNTER — Other Ambulatory Visit: Payer: Self-pay | Admitting: Internal Medicine

## 2015-11-15 ENCOUNTER — Other Ambulatory Visit: Payer: Self-pay | Admitting: Internal Medicine

## 2015-11-15 MED ORDER — LIDOCAINE-PRILOCAINE 2.5-2.5 % EX CREA: TOPICAL_CREAM | CUTANEOUS | Status: DC

## 2015-11-21 ENCOUNTER — Other Ambulatory Visit: Payer: Self-pay | Admitting: Internal Medicine

## 2015-11-21 ENCOUNTER — Other Ambulatory Visit: Payer: Self-pay | Admitting: Physician Assistant

## 2015-11-21 NOTE — Telephone Encounter (Signed)
Rx called into [facility].

## 2015-11-23 ENCOUNTER — Other Ambulatory Visit: Payer: Self-pay | Admitting: *Deleted

## 2015-11-23 MED ORDER — ALPRAZOLAM 1 MG PO TABS: ORAL_TABLET | ORAL | Status: DC

## 2015-11-24 ENCOUNTER — Other Ambulatory Visit: Payer: Self-pay | Admitting: Internal Medicine

## 2015-12-04 ENCOUNTER — Encounter: Payer: Self-pay | Admitting: Neurology

## 2015-12-04 ENCOUNTER — Ambulatory Visit (INDEPENDENT_AMBULATORY_CARE_PROVIDER_SITE_OTHER): Payer: BC Managed Care – PPO | Admitting: Neurology

## 2015-12-04 VITALS — BP 148/100 | HR 86 | Resp 20 | Ht 71.0 in | Wt 214.0 lb

## 2015-12-04 DIAGNOSIS — S270XXA Traumatic pneumothorax, initial encounter: Secondary | ICD-10-CM | POA: Insufficient documentation

## 2015-12-04 DIAGNOSIS — J301 Allergic rhinitis due to pollen: Secondary | ICD-10-CM | POA: Diagnosis not present

## 2015-12-04 DIAGNOSIS — T732XXS Exhaustion due to exposure, sequela: Secondary | ICD-10-CM

## 2015-12-04 DIAGNOSIS — G4730 Sleep apnea, unspecified: Secondary | ICD-10-CM | POA: Diagnosis not present

## 2015-12-04 DIAGNOSIS — S270XXS Traumatic pneumothorax, sequela: Secondary | ICD-10-CM

## 2015-12-04 DIAGNOSIS — I10 Essential (primary) hypertension: Secondary | ICD-10-CM | POA: Diagnosis not present

## 2015-12-04 DIAGNOSIS — R5383 Other fatigue: Secondary | ICD-10-CM | POA: Insufficient documentation

## 2015-12-04 DIAGNOSIS — R0683 Snoring: Secondary | ICD-10-CM

## 2015-12-04 HISTORY — DX: Traumatic pneumothorax, initial encounter: S27.0XXA

## 2015-12-04 HISTORY — DX: Sleep apnea, unspecified: G47.30

## 2015-12-04 MED ORDER — FEXOFENADINE HCL 180 MG PO TABS: 180.0000 mg | ORAL_TABLET | Freq: Every day | ORAL | Status: DC

## 2015-12-04 MED ORDER — MOMETASONE FUROATE 50 MCG/ACT NA SUSP: 2.0000 | Freq: Every day | NASAL | Status: DC

## 2015-12-04 NOTE — Patient Instructions (Addendum)
Please remember to try to maintain good sleep hygiene, which means: Keep a regular sleep and wake schedule, try not to exercise or have a meal within 2 hours of your bedtime, try to keep your bedroom conducive for sleep, that is, cool and dark, without light distractors such as an illuminated alarm clock, and refrain from watching TV right before sleep or in the middle of the night and do not keep the TV or radio on during the night. Also, try not to use or play on electronic devices at bedtime, such as your cell phone, tablet PC or laptop. If you like to read at bedtime on an electronic device, try to dim the background light as much as possible. Do not eat in the middle of the night.   We will request a sleep study.    We will look for leg twitching and snoring or sleep apnea.   For chronic insomnia, you are best followed by a psychiatrist and/or sleep psychologist.   We will call you with the sleep study results and make a follow up appointment if needed.    I added NASONEX and FEXOFENADINE allergy medications to your med list.

## 2015-12-04 NOTE — Addendum Note (Signed)
Addended by: Larey Seat on: 12/04/2015 11:09 AM   Modules accepted: Orders

## 2015-12-04 NOTE — Progress Notes (Signed)
SLEEP MEDICINE CLINIC   Provider:  Larey Seat, M D  Referring Provider: Unk Pinto, MD Primary Care Physician:  Alesia Richards, MD  Chief Complaint  Patient presents with  . New Patient (Initial Visit)    never had sleep study, snores, rm 10, alone    HPI:  Matthew Salinas is a 50 y.o. male , seen here as a referral  from Dr. Melford Aase for a sleep medicine consultation,   Matthew Salinas is a gainfully employed, right handed caucasian male with Hypertension since 2005. He does not have any additional symptoms such as frequent headaches, chest pain or palpitations, dizziness or claudication shortness of breath or diaphoresis. He has also been diagnosed with hyperlipidemia in the year 2009 but could not tolerate statins. Dr. Melford Aase noted elevated triglycerides of 439. He has chronically low vitamin D levels but supplements sporadically, is also monitored for prediabetes the current HbA1c was 5.5.  Matthew Salinas lives with his fiance, who has noted him to snore so loudly that she leaves the shared bedroom. When the patient sleeps in supine position he has woken up gagging gasping for air, choking. During his primary care visit the size of his tonsils was commented upon. He has large tonsils. He has allergic rhinitis and a postnasal drip. The last few weeks he has developed some sinus related headaches, they started when he leaves the house and gets outside. He does not wake up with headaches or from headaches.  Sleep habits are as follows: The patient works for [employer], moving trucks. Previously, he was a school Company secretary. He prefers a bedtime between 9 and 10:00, and usually is asleep promptly. He cannot sleep on his right side due to injuries to his rib cage and a collapsed lung and shoulder injuries. He either sleeps prone or on his left side but he has woken from sleep finding himself in supine. He sleeps on multiple pillows to avoid joint pain and also to allow him a  comfortable sleep position. His bedroom is described as cool, quiet and dark. He does not watch TV in the bedroom and doesn't watch a lot of TV prior to bedtime anyway. He will go to the bathroom about twice each night, usually between 2 and 3 AM. He had a history of shift work until 10 months ago 1 week starting at 5:30 AM, the next week starting at 8 AM. He still tends to wake up spontaneously at about 4:30 AM. He would like to sleep another hour. He wakes up with a dry mouth during allergy season when his nose is blocked. He does not wake up refreshed or restored recently, and his fiance has confirmed that he sleeps restlessly and snores loudly. He does not describe headaches however related to sleep.  Sleep medical history and family sleep history:  No history of sleepwalking, night terrors or enuresis in childhood. He is not sure if his brothers or his parents had any kind of apnea but his father was a loud snorer.  Social history: Matthew Salinas lives with his fiance, who is studying to be a Marine scientist. He has 2 children that have grown and are no longer apparent to home, his fiance has a 32 year old son.  Review of Systems: Out of a complete 14 system review, the patient complains of only the following symptoms, and all other reviewed systems are negative.  Matthew Salinas wakes up still feeling tired.  Epworth score 1, Fatigue severity score 10  , depression score 0  Social History   Social History  . Marital Status: Married    Spouse Name: N/A  . Number of Children: N/A  . Years of Education: N/A   Occupational History  . Not on file.   Social History Main Topics  . Smoking status: Former Smoker    Quit date: 08/05/2001  . Smokeless tobacco: Never Used     Comment: Smoked 5-10 cigarettes per day since age 89  . Alcohol Use: 6.0 oz/week    10 Standard drinks or equivalent per week  . Drug Use: No  . Sexual Activity: Not on file   Other Topics Concern  . Not on file   Social  History Narrative    Family History  Problem Relation Age of Onset  . Cancer Mother     lung  . Hypertension Father   . Hyperlipidemia Father   . Dementia Brother   . Colon cancer Maternal Grandmother     Past Medical History  Diagnosis Date  . Hypertension   . Hyperlipidemia   . GERD (gastroesophageal reflux disease)   . Vitamin D deficiency   . IBS (irritable bowel syndrome)   . Allergy     Past Surgical History  Procedure Laterality Date  . Chest tube insertion Right 05-15-02    thoracostomy  . Toe surgery  2008    right big toe    Current Outpatient Prescriptions  Medication Sig Dispense Refill  . ALPRAZolam (XANAX XR) 1 MG 24 hr tablet TAKE ONE-HALF TO ONE TABLET BY MOUTH TWICE DAILY TO THREE TIMES DAILY AS NEEDED 90 tablet 0  . ANUCORT-HC 25 MG suppository UNWRAP AND INSERT ONE SUPPOSITORY RECTALLY THREE TIMES DAILY AS NEEDED 24 suppository 3  . cholecalciferol (VITAMIN D) 1000 UNITS tablet Take 2,000 Units by mouth daily.    Marland Kitchen KRILL OIL PO Take by mouth.    . lidocaine-prilocaine (EMLA) cream APPLY TOPICALLY AS NEEDED 30 g 1  . lisinopril-hydrochlorothiazide (PRINZIDE,ZESTORETIC) 20-25 MG tablet TAKE ONE TABLET BY MOUTH IN THE MORNING FOR BLOOD PRESSURE 90 tablet 0  . loratadine (CLARITIN) 10 MG tablet Take 1 tablet (10 mg total) by mouth daily. 90 tablet 99  . Multiple Vitamins-Minerals (MULTIVITAMIN PO) Take by mouth.    Marland Kitchen PROCTOSOL HC 2.5 % rectal cream APPLY  CREAM RECTALLY  4 TIMES DAILY 29 g 99   No current facility-administered medications for this visit.    Allergies as of 12/04/2015 - Review Complete 12/04/2015  Allergen Reaction Noted  . Lipitor [atorvastatin]  05/16/2014  . Prozac [fluoxetine hcl]  07/13/2013  . Simvastatin  05/16/2014    Vitals: BP 148/100 mmHg  Pulse 86  Resp 20  Ht 5\' 11"  (1.803 m)  Wt 214 lb (97.07 kg)  BMI 29.86 kg/m2 Last Weight:  Wt Readings from Last 1 Encounters:  12/04/15 214 lb (97.07 kg)   [id number] mass  index is 29.86 kg/(m^2).     Last Height:   Ht Readings from Last 1 Encounters:  12/04/15 5\' 11"  (1.803 m)    Physical exam:  General: The patient is awake, alert and appears not in acute distress. The patient is well groomed. Head: Normocephalic, atraumatic. Neck is supple. Mallampati 2- his uvula is red, swollen and sits between enlarged tonsils,  He does have nasal congestion and he reports a postnasal drip. This all seems to be allergy related including some ciliary injection. neck circumference:17.75 . Nasal airflow restricted , TMJ click is not evident . Retrognathia is not seen.  Cardiovascular:  Regular rate and rhythm , without  murmurs or carotid bruit, and without distended neck veins. Respiratory: Lungs are clear to auscultation. Skin:  Without evidence of edema, or rash Trunk: BMI is elevated The patient's posture is erect.   Neurologic exam : The patient is awake and alert, oriented to place and time.   Attention span & concentration ability appears normal.  Speech is fluent,  without dysarthria, dysphonia or aphasia.  Mood and affect are appropriate.  Cranial nerves: Pupils are equal and briskly reactive to light. Funduscopic exam without evidence of pallor or edema.  Extraocular movements  in vertical and horizontal planes intact and without nystagmus. Visual fields by finger perimetry are intact. Hearing to finger rub intact.   Facial sensation intact to fine touch.  Facial motor strength is symmetric and tongue and uvula move midline. Shoulder shrug was symmetrical.   Motor exam:  Normal tone, muscle bulk and symmetric strength in all extremities. Sensory:  Fine touch, pinprick and vibration were tested in all extremities. Proprioception tested in the upper extremities was normal. Coordination: Rapid alternating movements in the fingers/hands was normal. Gait and station: Patient walks without assistive device and is able unassisted to climb up to the exam table.  Strength within normal limits.  Stance is stable and normal.  Deep tendon reflexes: in the  upper and lower extremities are symmetric and intact. Babinski maneuver response is downgoing.  The patient was advised of the nature of the diagnosed sleep disorder , the treatment options and risks for general a health and wellness arising from not treating the condition.  I spent more than 40 minutes of face to face time with the patient. Greater than 50% of time was spent in counseling and coordination of care. We have discussed the diagnosis and differential and I answered the patient's questions.     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 This may change over the next year or so, however for him to get more sleep but at this time mean to go to bed even earlier. That his sleep quality seems less restorative and refreshing than desired is another problem. He does have very large tonsils, seasonal rhinitis, frequent sinusitis. It is difficult to imagine that he would tolerate nasal CPAP in case of apnea is diagnosed given that his nasal airway is congested for good part of the year. I would like for him to take it daily antihistamine he could also use one of the more modern nondrowsy making allergy medication such as Allegra-fexofenadine. I will prescribe him a nasal spray that his anti-allergy centered, I would like to refrain from using steroids.  I will book a split-night polysomnography for this patient given his history of loud almost thunderous snoring. If we indeed find apnea, I would rather refer him for an ENT procedure than nasal CPAP given the above described  rhinitis.  Plan:  Treatment plan and additional workup :  I will order Nasonex, fexofenadine daily by mouth. The patient is no longer taking Flexeril which was listed on his active medication list, is also not using tramadol he uses sometimes a quarter of an alprazolam tablet. SPLIT night ,  RV after study .  Asencion Partridge Dyke Weible MD  12/04/2015   CC: Unk Pinto, [place] [place], [place] [address]

## 2015-12-07 ENCOUNTER — Ambulatory Visit (INDEPENDENT_AMBULATORY_CARE_PROVIDER_SITE_OTHER): Payer: BC Managed Care – PPO | Admitting: Neurology

## 2015-12-07 DIAGNOSIS — T732XXS Exhaustion due to exposure, sequela: Secondary | ICD-10-CM

## 2015-12-07 DIAGNOSIS — G4730 Sleep apnea, unspecified: Secondary | ICD-10-CM

## 2015-12-07 DIAGNOSIS — R0683 Snoring: Secondary | ICD-10-CM

## 2015-12-20 ENCOUNTER — Telehealth: Payer: Self-pay

## 2015-12-20 NOTE — Telephone Encounter (Signed)
I spoke to pt to give him his sleep study results but he says "I am right in the middle of something." He will call me tomorrow at 8:00 to discuss his results.

## 2015-12-20 NOTE — Telephone Encounter (Signed)
Pt returned my call. I advised him that his sleep study revealed snoring and not frank apnea. I advised pt that alternative therapies may include: oral appliance or ENT procedure/evaluation. Weight loss and positional therapy may be entertained. 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 I advised pt that in a follow up visit, Dr. Brett Fairy could discuss treating the PLMS if pt would like. Pt says he thinks he had PLMS because he did not sleep well during the study and was restless. I advised pt to lose weight, diet, and exercise if not contraindicated by his other physicians. Pt verbalized understanding. I advised pt to avoid caffeine containing beverages and chocolate. Pt declined referrals for an oral appliance, to ENT, and a follow up visit with Dr. Brett Fairy. He wants me to fax his results to Dr. Melford Aase and discuss the results with Dr. Melford Aase. I encouraged pt to call back if he decides he wants a follow up appt with Dr. Brett Fairy. Pt verbalized understanding of his results. Pt had no questions at this time but was encouraged to call back if questions arise.

## 2015-12-21 ENCOUNTER — Encounter: Payer: Self-pay | Admitting: Physician Assistant

## 2015-12-21 ENCOUNTER — Ambulatory Visit (INDEPENDENT_AMBULATORY_CARE_PROVIDER_SITE_OTHER): Payer: BC Managed Care – PPO | Admitting: Physician Assistant

## 2015-12-21 VITALS — BP 132/74 | HR 91 | Temp 97.9°F | Resp 16 | Ht 71.0 in | Wt 218.2 lb

## 2015-12-21 DIAGNOSIS — E559 Vitamin D deficiency, unspecified: Secondary | ICD-10-CM | POA: Diagnosis not present

## 2015-12-21 DIAGNOSIS — Z79899 Other long term (current) drug therapy: Secondary | ICD-10-CM

## 2015-12-21 DIAGNOSIS — E782 Mixed hyperlipidemia: Secondary | ICD-10-CM

## 2015-12-21 DIAGNOSIS — R7303 Prediabetes: Secondary | ICD-10-CM | POA: Diagnosis not present

## 2015-12-21 DIAGNOSIS — I10 Essential (primary) hypertension: Secondary | ICD-10-CM

## 2015-12-21 LAB — BASIC METABOLIC PANEL WITH GFR
BUN: 16 mg/dL (ref 7–25)
CALCIUM: 9.4 mg/dL (ref 8.6–10.3)
CHLORIDE: 99 mmol/L (ref 98–110)
CO2: 24 mmol/L (ref 20–31)
CREATININE: 0.82 mg/dL (ref 0.70–1.33)
GFR, Est African American: >89 mL/min (ref >=60)
GLUCOSE: 99 mg/dL (ref 65–99)
Potassium: 4.7 mmol/L (ref 3.5–5.3)
SODIUM: 136 mmol/L (ref 135–146)

## 2015-12-21 LAB — CBC WITH DIFFERENTIAL/PLATELET
BASOS ABS: 57 {cells}/uL (ref 0–200)
Basophils Relative: 1 %
EOS PCT: 2 %
Eosinophils Absolute: 114 cells/uL (ref 15–500)
HEMATOCRIT: 43.6 % (ref 38.5–50.0)
HEMOGLOBIN: 14.6 g/dL (ref 13.2–17.1)
LYMPHS ABS: 2337 {cells}/uL (ref 850–3900)
Lymphocytes Relative: 41 %
MCH: 31.1 pg (ref 27.0–33.0)
MCHC: 33.5 g/dL (ref 32.0–36.0)
MCV: 93 fL (ref 80.0–100.0)
MONO ABS: 456 {cells}/uL (ref 200–950)
MPV: 10.5 fL (ref 7.5–12.5)
Monocytes Relative: 8 %
NEUTROS ABS: 2736 {cells}/uL (ref 1500–7800)
NEUTROS PCT: 48 %
Platelets: 296 10*3/uL (ref 140–400)
RBC: 4.69 MIL/uL (ref 4.20–5.80)
RDW: 12.8 % (ref 11.0–15.0)
WBC: 5.7 10*3/uL (ref 3.8–10.8)

## 2015-12-21 LAB — MAGNESIUM: MAGNESIUM: 2 mg/dL (ref 1.5–2.5)

## 2015-12-21 LAB — HEPATIC FUNCTION PANEL
ALT: 27 U/L (ref 9–46)
AST: 26 U/L (ref 10–35)
Albumin: 4.6 g/dL (ref 3.6–5.1)
Alkaline Phosphatase: 57 U/L (ref 40–115)
BILIRUBIN DIRECT: 0.1 mg/dL (ref <=0.2)
BILIRUBIN INDIRECT: 0.6 mg/dL (ref 0.2–1.2)
TOTAL PROTEIN: 7.3 g/dL (ref 6.1–8.1)
Total Bilirubin: 0.7 mg/dL (ref 0.2–1.2)

## 2015-12-21 LAB — LIPID PANEL
Cholesterol: 286 mg/dL — ABNORMAL HIGH (ref 125–200)
HDL: 50 mg/dL (ref >=40)
LDL CALC: 181 mg/dL — AB (ref <130)
Total CHOL/HDL Ratio: 5.7 Ratio — ABNORMAL HIGH (ref <=5.0)
Triglycerides: 275 mg/dL — ABNORMAL HIGH (ref <150)
VLDL: 55 mg/dL — ABNORMAL HIGH (ref <30)

## 2015-12-21 LAB — TSH: TSH: 2.62 mIU/L (ref 0.40–4.50)

## 2015-12-21 LAB — HEMOGLOBIN A1C
HEMOGLOBIN A1C: 5.4 % (ref <5.7)
MEAN PLASMA GLUCOSE: 108 mg/dL

## 2015-12-21 NOTE — Patient Instructions (Signed)
The strongest is allegra or fexafinadine  Cheapest at [employer], [employer], [employer]  Your trigs are not in range, 09/19/2015: Triglycerides 439*. Triglycerides are simple sugars in blood that are converted into a storage form. I recommend you avoid fried/greasy foods, sweets/candy, white rice , white potatoes,  anything made from white flour, sweet tea, soda, fruit juices and avoid alcohol in excess. Sweet potatoes, brown/wild rice/Quinoa, Vegetarian, spinach, or wheat pasta, Multi-grain bread - like multi-grain flat bread or sandwich thins are okay.  This is elevated enough to cause acute pancreatitis which can put you in the hospital and kill you. VERY VERY important to be strict with diet.   Schedule a freezing  Actinic Keratosis Actinic keratosis is a precancerous growth on the skin. This means it could develop into skin cancer if it is not treated. About 1% of actinic keratoses turn into skin cancer within a year. It is important to have all such growths removed to prevent them from developing into skin cancer. CAUSES  Actinic keratosis is caused by getting too much ultraviolet (UV) radiation from the sun or other UV light sources. RISK FACTORS Factors that increase your chances of getting actinic keratosis include:  Having light-colored skin and blue eyes.  Having blonde or red hair.  Spending a lot of time in the sun.  Age. The risk of actinic keratosis increases with age. SYMPTOMS  Actinic keratosis growths look like scaly, rough spots of skin. They can be as small as a pinhead or as big as a quarter. They may itch, hurt, or feel sensitive. Sometimes there is a little tag of pink or gray skin growing off them. In some cases, actinic keratoses are easier felt than seen. They do not go away with the use of moisturizing lotions or creams. Actinic keratoses appear most often on areas of skin that get a lot of sun exposure. These areas include the:  Scalp.  Face.  Ears.  Lips.  Upper  back.  Backs of the hands.  Forearms. DIAGNOSIS  Your health care provider can usually tell what is wrong by performing a physical exam. A tissue sample (biopsy) may also be taken and examined under a microscope. TREATMENT  Actinic keratosis can be treated several ways. Most treatments can be done in your health care provider's office. Treatment options may include:  Curettage. A tool is used to gently scrape off the growth.  Cryosurgery. Liquid nitrogen is applied to the growth to freeze it. The growth eventually falls off the skin.  Medicated creams, such as 5-fluorouracil or imiquimod. The medicine destroys the cells in the growth.  Chemical peels. Chemicals are applied to the growth and the outer layers of skin are peeled off.  Photodynamic therapy. A drug that makes your skin more sensitive to light is applied to the skin. A strong, blue light is aimed at the skin and destroys the growth. PREVENTION  To prevent future sun damage:  Try to avoid the sun between 10:00 a.m. and 4:00 p.m. when it is the strongest.  Use a sunscreen or sunblock with SPF 30 or greater.  Apply sunscreen at least 30 minutes before exposure to the sun.  Always wear protective hats, clothing, and sunglasses with UV protection.  Avoid medicines, herbs, and foods that increase your sensitivity to sunlight.  Avoid tanning beds. HOME CARE INSTRUCTIONS   If your skin was covered with a bandage, change and remove the bandage as directed by your health care provider.  Keep the treated area dry as  directed by your health care provider.  Apply any creams as prescribed by your health care provider. Follow the directions carefully.  Check your skin regularly for any changes.  Visit a skin doctor (dermatologist) every year for a skin exam. SEEK MEDICAL CARE IF:   Your skin does not heal and becomes irritated, red, or bleeds.  You notice any changes or new growths on your skin.   This information is not  intended to replace advice given to you by your health care provider. Make sure you discuss any questions you have with your health care provider.   Document Released: 10/18/2008 Document Revised: 08/12/2014 Document Reviewed: 09/02/2011 Elsevier Interactive Patient Education [employer].

## 2015-12-21 NOTE — Progress Notes (Signed)
Assessment and Plan:  1. Hypertension -Continue medication, monitor blood pressure at home. Continue DASH diet.  Reminder to go to the ER if any CP, SOB, nausea, dizziness, severe HA, changes vision/speech, left arm numbness and tingling and jaw pain.  2. Cholesterol -Continue diet and exercise. Check cholesterol.  - FASTING THIS AM  3. Prediabetes  -Continue diet and exercise. Check A1C  4. Vitamin D Def - check level and continue medications.   5. Actinic Keratosis Schedule freezing  Continue diet and meds as discussed. Further disposition pending results of labs. Over 30 minutes of exam, counseling, chart review, and critical decision making was performed  HPI 50 y.o. male  presents for 3 month follow up on hypertension, cholesterol, prediabetes, and vitamin D deficiency.   His blood pressure has been controlled at home, today their BP is BP: 132/74 mmHg  He does workout. He denies chest pain, shortness of breath, dizziness.  He is not on cholesterol medication and denies myalgias. His cholesterol is not at goal. The cholesterol last visit was:   Lab Results  Component Value Date   CHOL 299* 09/19/2015   HDL 41 09/19/2015   LDLCALC NOT CALC 09/19/2015   TRIG 439* 09/19/2015   CHOLHDL 7.3* 09/19/2015   . Last A1C in the office was:  Lab Results  Component Value Date   HGBA1C 5.5 09/19/2015   Patient is on Vitamin D supplement.   Lab Results  Component Value Date   VD25OH 42 09/19/2015     Has several erythematous areas on face  Current Medications:  Current Outpatient Prescriptions on File Prior to Visit  Medication Sig Dispense Refill  . ALPRAZolam (XANAX XR) 1 MG 24 hr tablet TAKE ONE-HALF TO ONE TABLET BY MOUTH TWICE DAILY TO THREE TIMES DAILY AS NEEDED 90 tablet 0  . ANUCORT-HC 25 MG suppository UNWRAP AND INSERT ONE SUPPOSITORY RECTALLY THREE TIMES DAILY AS NEEDED 24 suppository 3  . cholecalciferol (VITAMIN D) 1000 UNITS tablet Take 2,000 Units by mouth  daily.    . fexofenadine (ALLEGRA) 180 MG tablet Take 1 tablet (180 mg total) by mouth daily. 30 tablet 2  . KRILL OIL PO Take by mouth.    . lidocaine-prilocaine (EMLA) cream APPLY TOPICALLY AS NEEDED 30 g 1  . lisinopril-hydrochlorothiazide (PRINZIDE,ZESTORETIC) 20-25 MG tablet TAKE ONE TABLET BY MOUTH IN THE MORNING FOR BLOOD PRESSURE 90 tablet 0  . loratadine (CLARITIN) 10 MG tablet Take 1 tablet (10 mg total) by mouth daily. 90 tablet 99  . mometasone (NASONEX) 50 MCG/ACT nasal spray Place 2 sprays into the nose daily. 17 g 12  . Multiple Vitamins-Minerals (MULTIVITAMIN PO) Take by mouth.    Marland Kitchen PROCTOSOL HC 2.5 % rectal cream APPLY  CREAM RECTALLY  4 TIMES DAILY 29 g 99   No current facility-administered medications on file prior to visit.   Medical History:  Past Medical History  Diagnosis Date  . Hypertension   . Hyperlipidemia   . GERD (gastroesophageal reflux disease)   . Vitamin D deficiency   . IBS (irritable bowel syndrome)   . Allergy   . Sleep apnea 12/04/2015   Allergies:  Allergies  Allergen Reactions  . Lipitor [Atorvastatin]     Myalgias  . Prozac [Fluoxetine Hcl]   . Simvastatin     Myalgias     Review of Systems:  Review of Systems  Constitutional: Negative.   HENT: Negative.   Eyes: Negative.   Respiratory: Negative.   Cardiovascular: Negative.   Gastrointestinal:  Negative.   Genitourinary: Negative for flank pain.  Musculoskeletal: Negative.   Skin: Positive for rash.  Neurological: Negative.   Endo/Heme/Allergies: Negative.   Psychiatric/Behavioral: Negative.     Family history- Review and unchanged Social history- Review and unchanged Physical Exam: BP 132/74 mmHg  Pulse 91  Temp(Src) 97.9 F (36.6 C) (Temporal)  Resp 16  Ht 5\' 11"  (1.803 m)  Wt 218 lb 3.2 oz (98.975 kg)  BMI 30.45 kg/m2  SpO2 97% Wt Readings from Last 3 Encounters:  12/21/15 218 lb 3.2 oz (98.975 kg)  12/04/15 214 lb (97.07 kg)  09/19/15 226 lb 6.4 oz (102.694 kg)    General Appearance: Well nourished, in no apparent distress. Eyes: PERRLA, EOMs, conjunctiva no swelling or erythema Sinuses: No Frontal/maxillary tenderness ENT/Mouth: Ext aud canals clear, TMs without erythema, bulging. No erythema, swelling, or exudate on post pharynx.  Tonsils not swollen or erythematous. Hearing normal.  Neck: Supple, thyroid normal.  Respiratory: Respiratory effort normal, BS equal bilaterally without rales, rhonchi, wheezing or stridor.  Cardio: RRR with no MRGs. Brisk peripheral pulses without edema.  Abdomen: Soft, + BS,  Non tender, no guarding, rebound, hernias, masses. Lymphatics: Non tender without lymphadenopathy.  Musculoskeletal: Full ROM, 5/5 strength, Normal gait Skin: Warm, dry without rashes, lesions, ecchymosis.  Neuro: Cranial nerves intact. Normal muscle tone, no cerebellar symptoms. Psych: Awake and oriented X 3, normal affect, Insight and Judgment appropriate.    Vicie Mutters, PA-C 9:40 AM [facility]

## 2015-12-22 ENCOUNTER — Other Ambulatory Visit: Payer: Self-pay | Admitting: Physician Assistant

## 2015-12-22 LAB — VITAMIN D 25 HYDROXY (VIT D DEFICIENCY, FRACTURES): VIT D 25 HYDROXY: 54 ng/mL (ref 30–100)

## 2015-12-22 MED ORDER — EPINEPHRINE 0.3 MG/0.3ML IJ SOAJ: 0.3000 mg | Freq: Once | INTRAMUSCULAR | Status: DC

## 2016-01-22 ENCOUNTER — Other Ambulatory Visit: Payer: Self-pay | Admitting: Physician Assistant

## 2016-01-23 ENCOUNTER — Other Ambulatory Visit: Payer: Self-pay | Admitting: *Deleted

## 2016-01-23 MED ORDER — ALPRAZOLAM 1 MG PO TABS: ORAL_TABLET | ORAL | Status: DC

## 2016-01-23 NOTE — Telephone Encounter (Signed)
Per Dr. Idell Pickles orders, patients Xanax Rx was changed from Xanax 1 mg XR TID to Xanax 1 mg TID.  Pharmacy was made aware of change.

## 2016-01-29 ENCOUNTER — Ambulatory Visit: Payer: Self-pay | Admitting: Physician Assistant

## 2016-02-22 ENCOUNTER — Encounter: Payer: Self-pay | Admitting: Internal Medicine

## 2016-03-27 ENCOUNTER — Ambulatory Visit (INDEPENDENT_AMBULATORY_CARE_PROVIDER_SITE_OTHER): Payer: BC Managed Care – PPO | Admitting: Internal Medicine

## 2016-03-27 ENCOUNTER — Encounter: Payer: Self-pay | Admitting: Internal Medicine

## 2016-03-27 VITALS — BP 128/84 | HR 76 | Temp 97.3°F | Resp 16 | Ht 72.0 in | Wt 221.8 lb

## 2016-03-27 DIAGNOSIS — E559 Vitamin D deficiency, unspecified: Secondary | ICD-10-CM | POA: Diagnosis not present

## 2016-03-27 DIAGNOSIS — R7303 Prediabetes: Secondary | ICD-10-CM

## 2016-03-27 DIAGNOSIS — Z0001 Encounter for general adult medical examination with abnormal findings: Secondary | ICD-10-CM

## 2016-03-27 DIAGNOSIS — Z0000 Encounter for general adult medical examination without abnormal findings: Secondary | ICD-10-CM | POA: Diagnosis not present

## 2016-03-27 DIAGNOSIS — I10 Essential (primary) hypertension: Secondary | ICD-10-CM | POA: Diagnosis not present

## 2016-03-27 DIAGNOSIS — Z136 Encounter for screening for cardiovascular disorders: Secondary | ICD-10-CM | POA: Diagnosis not present

## 2016-03-27 DIAGNOSIS — M25511 Pain in right shoulder: Secondary | ICD-10-CM

## 2016-03-27 DIAGNOSIS — Z79899 Other long term (current) drug therapy: Secondary | ICD-10-CM

## 2016-03-27 DIAGNOSIS — Z125 Encounter for screening for malignant neoplasm of prostate: Secondary | ICD-10-CM | POA: Diagnosis not present

## 2016-03-27 DIAGNOSIS — Z1211 Encounter for screening for malignant neoplasm of colon: Secondary | ICD-10-CM

## 2016-03-27 DIAGNOSIS — Z111 Encounter for screening for respiratory tuberculosis: Secondary | ICD-10-CM

## 2016-03-27 DIAGNOSIS — R5383 Other fatigue: Secondary | ICD-10-CM

## 2016-03-27 DIAGNOSIS — Z23 Encounter for immunization: Secondary | ICD-10-CM | POA: Diagnosis not present

## 2016-03-27 DIAGNOSIS — E782 Mixed hyperlipidemia: Secondary | ICD-10-CM

## 2016-03-27 DIAGNOSIS — K219 Gastro-esophageal reflux disease without esophagitis: Secondary | ICD-10-CM

## 2016-03-27 LAB — CBC WITH DIFFERENTIAL/PLATELET
BASOS ABS: 0 {cells}/uL (ref 0–200)
Basophils Relative: 0 %
EOS PCT: 1 %
Eosinophils Absolute: 51 cells/uL (ref 15–500)
HCT: 43.9 % (ref 38.5–50.0)
Hemoglobin: 14.7 g/dL (ref 13.2–17.1)
LYMPHS PCT: 40 %
Lymphs Abs: 2040 cells/uL (ref 850–3900)
MCH: 31.3 pg (ref 27.0–33.0)
MCHC: 33.5 g/dL (ref 32.0–36.0)
MCV: 93.4 fL (ref 80.0–100.0)
MONOS PCT: 9 %
MPV: 10.4 fL (ref 7.5–12.5)
Monocytes Absolute: 459 cells/uL (ref 200–950)
NEUTROS PCT: 50 %
Neutro Abs: 2550 cells/uL (ref 1500–7800)
Platelets: 322 10*3/uL (ref 140–400)
RBC: 4.7 MIL/uL (ref 4.20–5.80)
RDW: 12.6 % (ref 11.0–15.0)
WBC: 5.1 10*3/uL (ref 3.8–10.8)

## 2016-03-27 LAB — IRON AND TIBC
%SAT: 49 % (ref 15–60)
Iron: 168 ug/dL (ref 50–180)
TIBC: 343 ug/dL (ref 250–425)
UIBC: 175 ug/dL (ref 125–400)

## 2016-03-27 LAB — BASIC METABOLIC PANEL WITH GFR
BUN: 16 mg/dL (ref 7–25)
CALCIUM: 9.6 mg/dL (ref 8.6–10.3)
CO2: 25 mmol/L (ref 20–31)
CREATININE: 0.85 mg/dL (ref 0.70–1.33)
Chloride: 100 mmol/L (ref 98–110)
Glucose, Bld: 100 mg/dL — ABNORMAL HIGH (ref 65–99)
Potassium: 4.4 mmol/L (ref 3.5–5.3)
SODIUM: 135 mmol/L (ref 135–146)

## 2016-03-27 LAB — VITAMIN B12: Vitamin B-12: 444 pg/mL (ref 200–1100)

## 2016-03-27 LAB — HEPATIC FUNCTION PANEL
ALT: 22 U/L (ref 9–46)
AST: 24 U/L (ref 10–35)
Albumin: 4.3 g/dL (ref 3.6–5.1)
Alkaline Phosphatase: 51 U/L (ref 40–115)
BILIRUBIN DIRECT: 0.1 mg/dL (ref <=0.2)
BILIRUBIN INDIRECT: 0.8 mg/dL (ref 0.2–1.2)
TOTAL PROTEIN: 7.2 g/dL (ref 6.1–8.1)
Total Bilirubin: 0.9 mg/dL (ref 0.2–1.2)

## 2016-03-27 LAB — PSA: PSA: 0.5 ng/mL (ref <=4.0)

## 2016-03-27 LAB — LIPID PANEL
CHOL/HDL RATIO: 6 ratio — AB (ref <=5.0)
CHOLESTEROL: 287 mg/dL — AB (ref 125–200)
HDL: 48 mg/dL (ref >=40)
LDL Cholesterol: 167 mg/dL — ABNORMAL HIGH (ref <130)
TRIGLYCERIDES: 362 mg/dL — AB (ref <150)
VLDL: 72 mg/dL — AB (ref <30)

## 2016-03-27 LAB — MAGNESIUM: MAGNESIUM: 2.1 mg/dL (ref 1.5–2.5)

## 2016-03-27 LAB — TSH: TSH: 1.6 mIU/L (ref 0.40–4.50)

## 2016-03-27 NOTE — Progress Notes (Signed)
[facility]   Unk Pinto, M.D.    [name], P.A.-C      Starlyn Skeans, P.A.-C   [facility]                [address] [place] [id number] Telephone [phone] Telefax [phone]  Annual  Screening/Preventative Visit And Comprehensive Evaluation & Examination     This very nice 50y.o.[name] presents for a Wellness/Preventative Visit & comprehensive evaluation and management of multiple medical co-morbidities.  Patient has been followed for HTN, Prediabetes, Hyperlipidemia and Vitamin D Deficiency.     HTN predates since 2005. Patient's BP has been controlled at home.Today's BP is at goal  128/84. Patient denies any cardiac symptoms as chest pain, palpitations, shortness of breath, dizziness or ankle swelling. Patient also c/o longstanding pains of the Rt shoulder which he relates to a remote injury.      Patient is statin Intolerant and his  hyperlipidemia is not controlled with diet and supplements. Patient denies myalgias or other medication SE's. Last lipids were not at goal: Lab Results  Component Value Date   CHOL 286 (H) 12/21/2015   HDL 50 12/21/2015   LDLCALC 181 (H) 12/21/2015   TRIG 275 (H) 12/21/2015   CHOLHDL 5.7 (H) 12/21/2015      Patient is monitored expectantly for prediabetes since  and patient denies reactive hypoglycemic symptoms, visual blurring, diabetic polys or paresthesias. Last A1c was at goal: Lab Results  Component Value Date   HGBA1C 5.4 12/21/2015       Finally, patient has history of Vitamin D Deficiency  and last vitamin D was still relatively low from goal between 70-100.  Lab Results  Component Value Date   VD25OH 54 12/21/2015   Current Outpatient Prescriptions on File Prior to Visit  Medication Sig  . ALPRAZolam (XANAX) 1 MG tablet Takes 1/2 -1 tab 2-3 times per day prn anxiety  . ANUCORT-HC 25 MG suppository UNWRAP AND INSERT ONE  SUPPOSITORY RECTALLY THREE TIMES DAILY AS NEEDED  . cholecalciferol (VITAMIN D) 1000 UNITS tablet Take 2,000 Units by mouth daily.  Marland Kitchen EPINEPHrine 0.3 mg/0.3 mL IJ SOAJ injection Inject 0.3 mLs (0.3 mg total) into the muscle once.  Marland Kitchen KRILL OIL PO Take by mouth.  . lidocaine-prilocaine (EMLA) cream APPLY TOPICALLY AS NEEDED  . lisinopril-hydrochlorothiazide (PRINZIDE,ZESTORETIC) 20-25 MG tablet TAKE ONE TABLET BY MOUTH IN THE MORNING FOR BLOOD PRESSURE  . Multiple Vitamins-Minerals (MULTIVITAMIN PO) Take by mouth.  . Naproxen-Esomeprazole 500-20 MG TBEC One pill as needed  . PROCTOSOL HC 2.5 % rectal cream APPLY  CREAM RECTALLY  4 TIMES DAILY  . fexofenadine (ALLEGRA) 180 MG tablet Take 1 tablet (180 mg total) by mouth daily. (Patient not taking: Reported on 03/27/2016)  . loratadine (CLARITIN) 10 MG tablet Take 1 tablet (10 mg total) by mouth daily.   Allergies  Allergen Reactions  . Lipitor [Atorvastatin]     Myalgias  . Prozac [Fluoxetine Hcl]   . Simvastatin     Myalgias   Past Medical History:  Diagnosis Date  . Allergy   . GERD (gastroesophageal reflux disease)   . Hyperlipidemia   . Hypertension   . IBS (irritable bowel syndrome)   . Sleep apnea 12/04/2015  . Vitamin D deficiency    Health Maintenance  Topic Date Due  . TETANUS/TDAP  02/21/1983  . INFLUENZA VACCINE  03/05/2016  . COLONOSCOPY  06/27/2025  . Hepatitis C Screening  Completed  . HIV Screening  Completed   Immunization History  Administered Date(s) Administered  . PPD Test 01/19/2014  . Pneumococcal-Unspecified 01/06/2012  . Td 06/10/2006   Past Surgical History:  Procedure Laterality Date  . CHEST TUBE INSERTION Right 05-15-02   thoracostomy  . TOE SURGERY  2008   right big toe   Family History  Problem Relation Age of Onset  . Cancer Mother     lung  . Hypertension Father   . Hyperlipidemia Father   . Dementia Brother   . Colon cancer Maternal Grandmother    Social History   Social History   . Marital status: Married    Spouse name: N/A  . Number of children: N/A  . Years of education: N/A   Occupational History  . 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 Smoking status: Former Smoker    Quit date: 08/05/2001  . Smokeless tobacco: Never Used     Comment: Smoked 5-10 cigarettes per day since age 17  . Alcohol use 6.0 oz/week    10 Standard drinks or equivalent per week  . Drug use: No  . Sexual activity: active    ROS Constitutional: Denies fever, chills, weight loss/gain, headaches, insomnia,  night sweats or change in appetite. Does c/o fatigue. Eyes: Denies redness, blurred vision, diplopia, discharge, itchy or watery eyes.  ENT: Denies discharge, congestion, post nasal drip, epistaxis, sore throat, earache, hearing loss, dental pain, Tinnitus, Vertigo, Sinus pain or snoring.  Cardio: Denies chest pain, palpitations, irregular heartbeat, syncope, dyspnea, diaphoresis, orthopnea, PND, claudication or edema Respiratory: denies cough, dyspnea, DOE, pleurisy, hoarseness, laryngitis or wheezing.  Gastrointestinal: Denies dysphagia, heartburn, reflux, water brash, pain, cramps, nausea, vomiting, bloating, diarrhea, constipation, hematemesis, melena, hematochezia, jaundice or hemorrhoids Genitourinary: Denies dysuria, frequency, urgency, nocturia, hesitancy, discharge, hematuria or flank pain Musculoskeletal: Denies arthralgia, myalgia, stiffness, Jt. Swelling, pain, limp or strain/sprain. Denies Falls. Skin: Denies puritis, rash, hives, warts, acne, eczema or change in skin lesion Neuro: No weakness, tremor, incoordination, spasms, paresthesia or pain Psychiatric: Denies confusion, memory loss or sensory loss. Denies Depression. Endocrine: Denies change in weight, skin, hair change, nocturia, and paresthesia, diabetic polys, visual blurring or hyper / hypo glycemic episodes.   Heme/Lymph: No excessive bleeding, bruising or enlarged lymph nodes.  Physical Exam  BP 128/84   Pulse 76   Temp 97.3 F (36.3 C)   Resp 16   Ht 6' (1.829 m)   Wt 221 lb 12.8 oz (100.6 kg)   BMI 30.08 kg/m   General Appearance: Well nourished, in no apparent distress.  Eyes: PERRLA, EOMs, conjunctiva no swelling or erythema, normal fundi and vessels. Sinuses: No frontal/maxillary tenderness ENT/Mouth: EACs patent / TMs  nl. Nares clear without erythema, swelling, mucoid exudates. Oral hygiene is good. No erythema, swelling, or exudate. Tongue normal, non-obstructing. Tonsils not swollen or erythematous. Hearing normal.  Neck: Supple, thyroid normal. No bruits, nodes or JVD. Respiratory: Respiratory effort normal.  BS equal and clear bilateral without rales, rhonci, wheezing or stridor. Cardio: Heart sounds are normal with regular rate and rhythm and no murmurs, rubs or gallops. Peripheral pulses are normal and equal bilaterally without edema. No aortic or femoral bruits. Chest: symmetric with normal excursions and percussion.  Abdomen: Soft, with Nl bowel sounds. Nontender, no guarding, rebound, hernias,  masses, or organomegaly.  Lymphatics: Non tender without lymphadenopathy.  Genitourinary: No hernias.Testes nl. DRE - prostate nl for age - smooth & firm w/o nodules. Musculoskeletal: has decreased ROM of Rt shoulder limited by pain with int/ext rotation & abduction. Otherwise sensory-motor testing is normal and normal gait. Skin: Warm and dry without rashes, lesions, cyanosis, clubbing or  ecchymosis.  Neuro: Cranial nerves intact, reflexes equal bilaterally. Normal muscle tone, no cerebellar symptoms. Sensation intact.  Pysch: Alert and oriented X 3 with normal affect, insight and judgment appropriate.   Assessment and Plan  1. Annual Preventative/Screening Exam   2. Essential hypertension  - Microalbumin / creatinine urine ratio - EKG 12-Lead - [place], RETROPERITNL ABD,   LTD - TSH  3. Hyperlipidemia  - EKG 12-Lead - [place], RETROPERITNL ABD,  LTD - Lipid panel - TSH  4. Prediabetes  - EKG 12-Lead - [place], RETROPERITNL ABD,  LTD - Hemoglobin A1c - Insulin, random  5. Vitamin D deficiency  - VITAMIN D 25 Hydroxy   6. Gastroesophageal reflux disease   7. Colon cancer screening  - POC Hemoccult Bld/Stl   8. Prostate cancer screening  - PSA  9. Screening for ischemic heart disease  - EKG 12-Lead  10. Screening for AAA (aortic abdominal aneurysm)  - [place], RETROPERITNL ABD,  LTD  11. Other fatigue  - Vitamin B12 - Iron and TIBC - Testosterone - CBC with Differential/Platelet - TSH  12. Pain in joint of right shoulder  - Ambulatory referral to Orthopedic Surgery  13. Medication management  - Urinalysis, Routine w reflex microscopic  - CBC with Differential/Platelet - BASIC METABOLIC PANEL WITH GFR - Hepatic function panel - Magnesium  14. Screening examination for pulmonary tuberculosis  - PPD  15. Need for prophylactic vaccination with combined diphtheria-tetanus-pertussis (DTP) vaccine  - Tdap vaccine greater than or equal to 7yo IM   Continue prudent diet as discussed, weight control, BP monitoring, regular exercise, and medications as discussed.  Discussed med effects and SE's. Routine screening labs and tests as requested with regular follow-up as recommended. Over 40 minutes of exam, counseling, chart review and high complex critical decision making was performed

## 2016-03-27 NOTE — Patient Instructions (Signed)

## 2016-03-28 ENCOUNTER — Other Ambulatory Visit: Payer: Self-pay | Admitting: Internal Medicine

## 2016-03-28 LAB — HEMOGLOBIN A1C
HEMOGLOBIN A1C: 5.1 % (ref <5.7)
MEAN PLASMA GLUCOSE: 100 mg/dL

## 2016-03-28 LAB — URINALYSIS, ROUTINE W REFLEX MICROSCOPIC
BILIRUBIN URINE: NEGATIVE
GLUCOSE, UA: NEGATIVE
Hgb urine dipstick: NEGATIVE
Ketones, ur: NEGATIVE
Leukocytes, UA: NEGATIVE
Nitrite: NEGATIVE
PH: 7 (ref 5.0–8.0)
PROTEIN: NEGATIVE
SPECIFIC GRAVITY, URINE: 1.019 (ref 1.001–1.035)

## 2016-03-28 LAB — VITAMIN D 25 HYDROXY (VIT D DEFICIENCY, FRACTURES): VIT D 25 HYDROXY: 56 ng/mL (ref 30–100)

## 2016-03-28 LAB — INSULIN, RANDOM: Insulin: 6.4 u[IU]/mL (ref 2.0–19.6)

## 2016-03-28 LAB — MICROALBUMIN / CREATININE URINE RATIO
CREATININE, URINE: 121 mg/dL (ref 20–370)
MICROALB UR: 0.7 mg/dL
Microalb Creat Ratio: 6 mcg/mg creat (ref <30)

## 2016-03-28 LAB — TESTOSTERONE: TESTOSTERONE: 401 ng/dL (ref 250–827)

## 2016-03-28 MED ORDER — ROSUVASTATIN CALCIUM 40 MG PO TABS: ORAL_TABLET | ORAL | 2 refills | Status: DC

## 2016-04-01 LAB — TB SKIN TEST
Induration: 0 mm
TB SKIN TEST: NEGATIVE

## 2016-04-10 ENCOUNTER — Other Ambulatory Visit: Payer: Self-pay | Admitting: *Deleted

## 2016-04-10 DIAGNOSIS — Z1211 Encounter for screening for malignant neoplasm of colon: Secondary | ICD-10-CM

## 2016-04-10 LAB — POC HEMOCCULT BLD/STL (HOME/3-CARD/SCREEN)
Card #2 Fecal Occult Blod, POC: NEGATIVE
FECAL OCCULT BLD: NEGATIVE
Fecal Occult Blood, POC: NEGATIVE

## 2016-07-15 ENCOUNTER — Ambulatory Visit (INDEPENDENT_AMBULATORY_CARE_PROVIDER_SITE_OTHER): Payer: BC Managed Care – PPO | Admitting: Internal Medicine

## 2016-07-15 ENCOUNTER — Encounter: Payer: Self-pay | Admitting: Internal Medicine

## 2016-07-15 VITALS — BP 124/78 | HR 82 | Temp 98.4°F | Resp 16 | Ht 72.0 in | Wt 220.0 lb

## 2016-07-15 DIAGNOSIS — M25511 Pain in right shoulder: Secondary | ICD-10-CM

## 2016-07-15 DIAGNOSIS — E782 Mixed hyperlipidemia: Secondary | ICD-10-CM | POA: Diagnosis not present

## 2016-07-15 DIAGNOSIS — F419 Anxiety disorder, unspecified: Secondary | ICD-10-CM

## 2016-07-15 DIAGNOSIS — I10 Essential (primary) hypertension: Secondary | ICD-10-CM | POA: Diagnosis not present

## 2016-07-15 DIAGNOSIS — K649 Unspecified hemorrhoids: Secondary | ICD-10-CM

## 2016-07-15 DIAGNOSIS — G8929 Other chronic pain: Secondary | ICD-10-CM

## 2016-07-15 DIAGNOSIS — R7303 Prediabetes: Secondary | ICD-10-CM

## 2016-07-15 DIAGNOSIS — Z79899 Other long term (current) drug therapy: Secondary | ICD-10-CM | POA: Diagnosis not present

## 2016-07-15 DIAGNOSIS — E559 Vitamin D deficiency, unspecified: Secondary | ICD-10-CM

## 2016-07-15 LAB — HEPATIC FUNCTION PANEL
ALT: 26 U/L (ref 9–46)
AST: 26 U/L (ref 10–35)
Albumin: 4.6 g/dL (ref 3.6–5.1)
Alkaline Phosphatase: 46 U/L (ref 40–115)
BILIRUBIN INDIRECT: 0.7 mg/dL (ref 0.2–1.2)
Bilirubin, Direct: 0.1 mg/dL (ref <=0.2)
TOTAL PROTEIN: 7.2 g/dL (ref 6.1–8.1)
Total Bilirubin: 0.8 mg/dL (ref 0.2–1.2)

## 2016-07-15 LAB — CBC WITH DIFFERENTIAL/PLATELET
BASOS ABS: 52 {cells}/uL (ref 0–200)
Basophils Relative: 1 %
EOS PCT: 2 %
Eosinophils Absolute: 104 cells/uL (ref 15–500)
HCT: 43 % (ref 38.5–50.0)
HEMOGLOBIN: 14.5 g/dL (ref 13.2–17.1)
LYMPHS ABS: 2028 {cells}/uL (ref 850–3900)
LYMPHS PCT: 39 %
MCH: 31.7 pg (ref 27.0–33.0)
MCHC: 33.7 g/dL (ref 32.0–36.0)
MCV: 93.9 fL (ref 80.0–100.0)
MPV: 10.1 fL (ref 7.5–12.5)
Monocytes Absolute: 364 cells/uL (ref 200–950)
Monocytes Relative: 7 %
NEUTROS PCT: 51 %
Neutro Abs: 2652 cells/uL (ref 1500–7800)
Platelets: 303 10*3/uL (ref 140–400)
RBC: 4.58 MIL/uL (ref 4.20–5.80)
RDW: 12.7 % (ref 11.0–15.0)
WBC: 5.2 10*3/uL (ref 3.8–10.8)

## 2016-07-15 LAB — BASIC METABOLIC PANEL WITH GFR
BUN: 13 mg/dL (ref 7–25)
CALCIUM: 9.4 mg/dL (ref 8.6–10.3)
CO2: 27 mmol/L (ref 20–31)
CREATININE: 0.93 mg/dL (ref 0.70–1.33)
Chloride: 101 mmol/L (ref 98–110)
GFR, Est African American: >89 mL/min (ref >=60)
GFR, Est Non African American: >89 mL/min (ref >=60)
GLUCOSE: 101 mg/dL — AB (ref 65–99)
Potassium: 4.9 mmol/L (ref 3.5–5.3)
SODIUM: 136 mmol/L (ref 135–146)

## 2016-07-15 LAB — TSH: TSH: 1.79 m[IU]/L (ref 0.40–4.50)

## 2016-07-15 LAB — HEMOGLOBIN A1C
HEMOGLOBIN A1C: 5 % (ref <5.7)
Mean Plasma Glucose: 97 mg/dL

## 2016-07-15 LAB — LIPID PANEL
Cholesterol: 255 mg/dL — ABNORMAL HIGH (ref <200)
HDL: 54 mg/dL (ref >40)
LDL Cholesterol: 164 mg/dL — ABNORMAL HIGH (ref <100)
TRIGLYCERIDES: 186 mg/dL — AB (ref <150)
Total CHOL/HDL Ratio: 4.7 Ratio (ref <5.0)
VLDL: 37 mg/dL — ABNORMAL HIGH (ref <30)

## 2016-07-15 NOTE — Progress Notes (Signed)
Assessment and Plan:  Hypertension:  -Continue medication,  -monitor blood pressure at home.  -Continue DASH diet.   -Reminder to go to the ER if any CP, SOB, nausea, dizziness, severe HA, changes vision/speech, left arm numbness and tingling, and jaw pain.  Cholesterol: -intolerant to gemfibrozil, zetia, and statins -is currently taking red yeast rice -cont high fiber diet -Continue diet and exercise.  -Check cholesterol.   Pre-diabetes: -Continue diet and exercise.  -Check A1C  Vitamin D Def: -check level -continue medications.   Right shoulder pain -cont to follow with ortho -recommended MRI of the shoulder -discussed that surgery does not always yield positive outcome and pushed conservative approach  Hemorrhoids -cont to use creams/suppository -does not want referral to General Surgery at this time -recommended use of daily fiber supplement.   Continue diet and meds as discussed. Further disposition pending results of labs.  HPI 50 y.o. male  presents for 3 month follow up with hypertension, hyperlipidemia, prediabetes and vitamin D.   His blood pressure has been controlled at home, today their BP is BP: 124/78.   He does not workout. He denies chest pain, shortness of breath, dizziness.   He is not on cholesterol medication and denies myalgias. His cholesterol is not at goal. The cholesterol last visit was:   Lab Results  Component Value Date   CHOL 287 (H) 03/27/2016   HDL 48 03/27/2016   LDLCALC 167 (H) 03/27/2016   TRIG 362 (H) 03/27/2016   CHOLHDL 6.0 (H) 03/27/2016    He has been working on diet and exercise for prediabetes, and denies foot ulcerations, hyperglycemia, hypoglycemia , increased appetite, nausea, paresthesia of the feet, polydipsia, polyuria, visual disturbances, vomiting and weight loss. Last A1C in the office was:  Lab Results  Component Value Date   HGBA1C 5.1 03/27/2016    Patient is on Vitamin D supplement.  Lab Results  Component  Value Date   VD25OH 61 03/27/2016      He reports that he has been having some issues with is right shoulder.  He reports that he is seeing [facility].  He reports that he thinking of getting an MRI of his shoulder.    He reports that he did have his colonoscopy and he was told that his hemorrhoids are not bad enough to do anything with.  He is using cream and suppository when needed.    Current Medications:  Current Outpatient Prescriptions on File Prior to Visit  Medication Sig Dispense Refill  . ALPRAZolam (XANAX) 1 MG tablet Takes 1/2 -1 tab 2-3 times per day prn anxiety 90 tablet 5  . ANUCORT-HC 25 MG suppository UNWRAP AND INSERT ONE SUPPOSITORY RECTALLY THREE TIMES DAILY AS NEEDED 24 suppository 3  . cholecalciferol (VITAMIN D) 1000 UNITS tablet Take 2,000 Units by mouth daily.    . fexofenadine (ALLEGRA) 180 MG tablet Take 1 tablet (180 mg total) by mouth daily. 30 tablet 2  . KRILL OIL PO Take by mouth.    . lidocaine-prilocaine (EMLA) cream APPLY TOPICALLY AS NEEDED 30 g 1  . lisinopril-hydrochlorothiazide (PRINZIDE,ZESTORETIC) 20-25 MG tablet TAKE ONE TABLET BY MOUTH IN THE MORNING FOR BLOOD PRESSURE 90 tablet 0  . loratadine (CLARITIN) 10 MG tablet Take 1 tablet (10 mg total) by mouth daily. 90 tablet 99  . Multiple Vitamins-Minerals (MULTIVITAMIN PO) Take by mouth.    . Naproxen-Esomeprazole 500-20 MG TBEC One pill as needed 30 tablet 0  . PROCTOSOL HC 2.5 % rectal cream APPLY  CREAM  RECTALLY  4 TIMES DAILY 29 g 99   No current facility-administered medications on file prior to visit.     Medical History:  Past Medical History:  Diagnosis Date  . Allergy   . GERD (gastroesophageal reflux disease)   . Hyperlipidemia   . Hypertension   . IBS (irritable bowel syndrome)   . Sleep apnea 12/04/2015  . Vitamin D deficiency     Allergies:  Allergies  Allergen Reactions  . Lipitor [Atorvastatin]     Myalgias  . Prozac [Fluoxetine Hcl]   . Simvastatin     Myalgias      Review of Systems:  Review of Systems  Constitutional: Negative for chills, fever and malaise/fatigue.  HENT: Negative for congestion, ear pain and sore throat.   Eyes: Negative.   Respiratory: Negative for cough, shortness of breath and wheezing.   Cardiovascular: Negative for chest pain, palpitations and leg swelling.  Gastrointestinal: Negative for abdominal pain, blood in stool, constipation, diarrhea, heartburn and melena.  Genitourinary: Negative.   Skin: Negative.   Neurological: Negative for dizziness, sensory change, loss of consciousness and headaches.  Psychiatric/Behavioral: Negative for depression. The patient is not nervous/anxious and does not have insomnia.     Family history- Review and unchanged  Social history- Review and unchanged  Physical Exam: BP 124/78   Pulse 82   Temp 98.4 F (36.9 C) (Temporal)   Resp 16   Ht 6' (1.829 m)   Wt 220 lb (99.8 kg)   BMI 29.84 kg/m  Wt Readings from Last 3 Encounters:  07/15/16 220 lb (99.8 kg)  03/27/16 221 lb 12.8 oz (100.6 kg)  12/21/15 218 lb 3.2 oz (99 kg)    General Appearance: Well nourished well developed, in no apparent distress. Eyes: PERRLA, EOMs, conjunctiva no swelling or erythema ENT/Mouth: Ear canals normal without obstruction, swelling, erythma, discharge.  TMs normal bilaterally.  Oropharynx moist, clear, without exudate, or postoropharyngeal swelling. Neck: Supple, thyroid normal,no cervical adenopathy  Respiratory: Respiratory effort normal, Breath sounds clear A&P without rhonchi, wheeze, or rale.  No retractions, no accessory usage. Cardio: RRR with no MRGs. Brisk peripheral pulses without edema.  Abdomen: Soft, + BS,  Non tender, no guarding, rebound, hernias, masses. Musculoskeletal: Full ROM, 5/5 strength, Normal gait Skin: Warm, dry without rashes, lesions, ecchymosis.  Neuro: Awake and oriented X 3, Cranial nerves intact. Normal muscle tone, no cerebellar symptoms. Psych: Normal affect,  Insight and Judgment appropriate.    Starlyn Skeans, PA-C 9:08 AM [facility]

## 2016-08-11 ENCOUNTER — Other Ambulatory Visit: Payer: Self-pay | Admitting: Internal Medicine

## 2016-08-11 DIAGNOSIS — F411 Generalized anxiety disorder: Secondary | ICD-10-CM

## 2016-08-11 MED ORDER — ALPRAZOLAM 1 MG PO TABS: ORAL_TABLET | ORAL | 0 refills | Status: DC

## 2016-08-11 NOTE — Telephone Encounter (Signed)
Please call Alpraz  

## 2016-08-12 ENCOUNTER — Other Ambulatory Visit: Payer: Self-pay

## 2016-08-12 ENCOUNTER — Other Ambulatory Visit: Payer: Self-pay | Admitting: Internal Medicine

## 2016-08-12 DIAGNOSIS — F411 Generalized anxiety disorder: Secondary | ICD-10-CM

## 2016-08-12 MED ORDER — LORATADINE 10 MG PO TABS: 10.0000 mg | ORAL_TABLET | Freq: Every day | ORAL | 0 refills | Status: DC

## 2016-08-12 NOTE — Telephone Encounter (Signed)
Please call alpraz 

## 2016-09-16 ENCOUNTER — Other Ambulatory Visit: Payer: Self-pay | Admitting: Internal Medicine

## 2016-10-09 ENCOUNTER — Encounter: Payer: Self-pay | Admitting: Internal Medicine

## 2016-10-09 ENCOUNTER — Ambulatory Visit (INDEPENDENT_AMBULATORY_CARE_PROVIDER_SITE_OTHER): Payer: BC Managed Care – PPO | Admitting: Internal Medicine

## 2016-10-09 VITALS — BP 120/78 | HR 78 | Temp 98.0°F | Resp 16 | Ht 72.0 in | Wt 225.0 lb

## 2016-10-09 DIAGNOSIS — M79645 Pain in left finger(s): Secondary | ICD-10-CM

## 2016-10-09 DIAGNOSIS — M79644 Pain in right finger(s): Secondary | ICD-10-CM

## 2016-10-09 DIAGNOSIS — I10 Essential (primary) hypertension: Secondary | ICD-10-CM | POA: Diagnosis not present

## 2016-10-09 DIAGNOSIS — Z79899 Other long term (current) drug therapy: Secondary | ICD-10-CM | POA: Diagnosis not present

## 2016-10-09 DIAGNOSIS — E782 Mixed hyperlipidemia: Secondary | ICD-10-CM | POA: Diagnosis not present

## 2016-10-09 LAB — CBC WITH DIFFERENTIAL/PLATELET
BASOS PCT: 0 %
Basophils Absolute: 0 cells/uL (ref 0–200)
EOS ABS: 106 {cells}/uL (ref 15–500)
EOS PCT: 2 %
HCT: 43.7 % (ref 38.5–50.0)
Hemoglobin: 14.8 g/dL (ref 13.2–17.1)
Lymphocytes Relative: 34 %
Lymphs Abs: 1802 cells/uL (ref 850–3900)
MCH: 31.8 pg (ref 27.0–33.0)
MCHC: 33.9 g/dL (ref 32.0–36.0)
MCV: 94 fL (ref 80.0–100.0)
MONOS PCT: 8 %
MPV: 10.4 fL (ref 7.5–12.5)
Monocytes Absolute: 424 cells/uL (ref 200–950)
NEUTROS ABS: 2968 {cells}/uL (ref 1500–7800)
Neutrophils Relative %: 56 %
PLATELETS: 287 10*3/uL (ref 140–400)
RBC: 4.65 MIL/uL (ref 4.20–5.80)
RDW: 12.8 % (ref 11.0–15.0)
WBC: 5.3 10*3/uL (ref 3.8–10.8)

## 2016-10-09 LAB — HEPATIC FUNCTION PANEL
ALT: 24 U/L (ref 9–46)
AST: 27 U/L (ref 10–35)
Albumin: 4.4 g/dL (ref 3.6–5.1)
Alkaline Phosphatase: 48 U/L (ref 40–115)
BILIRUBIN DIRECT: 0.1 mg/dL (ref <=0.2)
BILIRUBIN TOTAL: 0.8 mg/dL (ref 0.2–1.2)
Indirect Bilirubin: 0.7 mg/dL (ref 0.2–1.2)
Total Protein: 7.4 g/dL (ref 6.1–8.1)

## 2016-10-09 LAB — LIPID PANEL
CHOL/HDL RATIO: 4.9 ratio (ref <5.0)
Cholesterol: 279 mg/dL — ABNORMAL HIGH (ref <200)
HDL: 57 mg/dL (ref >40)
LDL CALC: 154 mg/dL — AB (ref <100)
TRIGLYCERIDES: 341 mg/dL — AB (ref <150)
VLDL: 68 mg/dL — AB (ref <30)

## 2016-10-09 LAB — BASIC METABOLIC PANEL WITH GFR
BUN: 12 mg/dL (ref 7–25)
CO2: 23 mmol/L (ref 20–31)
Calcium: 9.3 mg/dL (ref 8.6–10.3)
Chloride: 102 mmol/L (ref 98–110)
Creat: 0.85 mg/dL (ref 0.70–1.33)
Glucose, Bld: 101 mg/dL — ABNORMAL HIGH (ref 65–99)
Potassium: 4.2 mmol/L (ref 3.5–5.3)
SODIUM: 137 mmol/L (ref 135–146)

## 2016-10-09 LAB — TSH: TSH: 1.65 mIU/L (ref 0.40–4.50)

## 2016-10-09 NOTE — Progress Notes (Signed)
Assessment and Plan:  Hypertension:  -stop medications  -monitor blood pressure at home.  -Continue DASH diet.   -Reminder to go to the ER if any CP, SOB, nausea, dizziness, severe HA, changes vision/speech, left arm numbness and tingling, and jaw pain.  Cholesterol: -Continue diet and exercise.  -Check cholesterol.   Pre-diabetes: -Continue diet and exercise.  -Check A1C  Vitamin D Def: -continue medications.   Finger swelling -OA vs. tendinopathy vs. Possible inflammatory arthritis -will check RH, anti-ccp, Sed rate and CRP   Continue diet and meds as discussed. Further disposition pending results of labs.  HPI 51 y.o. male  presents for 3 month follow up with hypertension, hyperlipidemia, prediabetes and vitamin D.   His blood pressure has been controlled at home, today their BP is BP: 120/78.   He does workout. He denies chest pain, shortness of breath, dizziness.  He reports that he stopped taking his blood pressure medication and it went to 130/84.  He reports that he is currently only taking a 1/4 tablet daily.     He is not on cholesterol medication and denies myalgias. His cholesterol is at goal. The cholesterol last visit was:   Lab Results  Component Value Date   CHOL 255 (H) 07/15/2016   HDL 54 07/15/2016   LDLCALC 164 (H) 07/15/2016   TRIG 186 (H) 07/15/2016   CHOLHDL 4.7 07/15/2016     He has been working on diet and exercise for prediabetes, and denies foot ulcerations, hyperglycemia, hypoglycemia , increased appetite, nausea, paresthesia of the feet, polydipsia, polyuria, visual disturbances, vomiting and weight loss. Last A1C in the office was:  Lab Results  Component Value Date   HGBA1C 5.0 07/15/2016    Patient is on Vitamin D supplement.  Lab Results  Component Value Date   VD25OH 61 03/27/2016     He has had some swelling recently in his in his bilateral middle fingers and also in his left 1st MTP.  He reports that he sometimes has his fingers  lock into position and he has to pop them open.  He reports that there is no family history of inflammatory arthritis in his family he is aware of.     Current Medications:  Current Outpatient Prescriptions on File Prior to Visit  Medication Sig Dispense Refill  . ALPRAZolam (XANAX) 1 MG tablet TAKE ONE TABLET BY MOUTH TWICE DAILY TO THREE TIMES DAILY AS NEEDED 90 tablet 2  . ANUCORT-HC 25 MG suppository UNWRAP AND INSERT ONE SUPPOSITORY RECTALLY THREE TIMES DAILY AS NEEDED 24 suppository 3  . cholecalciferol (VITAMIN D) 1000 UNITS tablet Take 2,000 Units by mouth daily.    Marland Kitchen lidocaine-prilocaine (EMLA) cream APPLY TOPICALLY AS NEEDED 30 g 1  . lisinopril-hydrochlorothiazide (PRINZIDE,ZESTORETIC) 20-25 MG tablet TAKE ONE TABLET BY MOUTH IN THE MORNING FOR BLOOD PRESSURE 90 tablet 0  . loratadine (CLARITIN) 10 MG tablet Take 1 tablet (10 mg total) by mouth daily. 90 tablet 0  . Multiple Vitamins-Minerals (MULTIVITAMIN PO) Take by mouth.    . Naproxen-Esomeprazole 500-20 MG TBEC One pill as needed 30 tablet 0  . PROCTOSOL HC 2.5 % rectal cream APPLY  CREAM RECTALLY  4 TIMES DAILY 29 g 99   No current facility-administered medications on file prior to visit.     Medical History:  Past Medical History:  Diagnosis Date  . Allergy   . GERD (gastroesophageal reflux disease)   . Hyperlipidemia   . Hypertension   . IBS (irritable bowel syndrome)   .  Sleep apnea 12/04/2015  . Vitamin D deficiency     Allergies:  Allergies  Allergen Reactions  . Lipitor [Atorvastatin]     Myalgias  . Prozac [Fluoxetine Hcl]   . Simvastatin     Myalgias     Review of Systems:  Review of Systems  Constitutional: Negative for chills, fever and malaise/fatigue.  HENT: Negative for congestion, ear pain and sore throat.   Eyes: Negative.   Respiratory: Negative for cough, shortness of breath and wheezing.   Cardiovascular: Negative for chest pain, palpitations and leg swelling.  Gastrointestinal:  Negative for abdominal pain, blood in stool, constipation, diarrhea, heartburn and melena.  Genitourinary: Negative.   Skin: Negative.   Neurological: Negative for dizziness, sensory change, loss of consciousness and headaches.  Psychiatric/Behavioral: Negative for depression. The patient is not nervous/anxious and does not have insomnia.     Family history- Review and unchanged  Social history- Review and unchanged  Physical Exam: BP 120/78   Pulse 78   Temp 98 F (36.7 C) (Temporal)   Resp 16   Ht 6' (1.829 m)   Wt 225 lb (102.1 kg)   BMI 30.52 kg/m  Wt Readings from Last 3 Encounters:  10/09/16 225 lb (102.1 kg)  07/15/16 220 lb (99.8 kg)  03/27/16 221 lb 12.8 oz (100.6 kg)    General Appearance: Well nourished well developed, in no apparent distress. Eyes: PERRLA, EOMs, conjunctiva no swelling or erythema ENT/Mouth: Ear canals normal without obstruction, swelling, erythma, discharge.  TMs normal bilaterally.  Oropharynx moist, clear, without exudate, or postoropharyngeal swelling. Neck: Supple, thyroid normal,no cervical adenopathy  Respiratory: Respiratory effort normal, Breath sounds clear A&P without rhonchi, wheeze, or rale.  No retractions, no accessory usage. Cardio: RRR with no MRGs. Brisk peripheral pulses without edema.  Abdomen: Soft, + BS,  Non tender, no guarding, rebound, hernias, masses. Musculoskeletal: Full ROM, 5/5 strength, Normal gait.  Bunion to the left foot.  Mild swelling at the PIP joints. Skin: Warm, dry without rashes, lesions, ecchymosis.  Neuro: Awake and oriented X 3, Cranial nerves intact. Normal muscle tone, no cerebellar symptoms. Psych: Normal affect, Insight and Judgment appropriate.    Starlyn Skeans, PA-C 10:09 AM [facility]

## 2016-10-10 LAB — CYCLIC CITRUL PEPTIDE ANTIBODY, IGG: Cyclic Citrullin Peptide Ab: <16 Units

## 2016-10-10 LAB — C-REACTIVE PROTEIN: CRP: 1.4 mg/L (ref <8.0)

## 2016-10-10 LAB — SEDIMENTATION RATE: Sed Rate: 1 mm/hr (ref 0–20)

## 2016-10-10 LAB — RHEUMATOID FACTOR: Rhuematoid fact SerPl-aCnc: <14 IU/mL (ref <14)

## 2016-10-15 ENCOUNTER — Ambulatory Visit: Payer: Self-pay | Admitting: Internal Medicine

## 2016-10-28 ENCOUNTER — Encounter: Payer: Self-pay | Admitting: Physician Assistant

## 2016-10-28 ENCOUNTER — Ambulatory Visit (INDEPENDENT_AMBULATORY_CARE_PROVIDER_SITE_OTHER): Payer: BC Managed Care – PPO | Admitting: Physician Assistant

## 2016-10-28 VITALS — BP 122/78 | HR 83 | Temp 98.1°F | Resp 14 | Ht 72.0 in | Wt 224.4 lb

## 2016-10-28 DIAGNOSIS — S61412A Laceration without foreign body of left hand, initial encounter: Secondary | ICD-10-CM

## 2016-10-28 MED ORDER — SULFAMETHOXAZOLE-TRIMETHOPRIM 800-160 MG PO TABS: 1.0000 | ORAL_TABLET | Freq: Two times a day (BID) | ORAL | 0 refills | Status: DC

## 2016-10-28 NOTE — Progress Notes (Signed)
   Subjective:    Patient ID: Matthew Salinas, male    DOB: 01-14-1966, 51 y.o.   MRN: [id number]  HPI 51 y.o. right handed WM presents with saw mill.  Broke piece off and it his is left thumb yesterday around 1 pm. Wife is nurse, flushed it out and taped it up. He is UTD on tetanus. He has good sensation, good movement. No change of foreign body.      Immunization History  Administered Date(s) Administered  . PPD Test 01/19/2014, 03/27/2016  . Pneumococcal-Unspecified 01/06/2012  . Td 06/10/2006  . Tdap 03/27/2016    Blood pressure 122/78, pulse 83, temperature 98.1 F (36.7 C), resp. rate 14, height 6' (1.829 m), weight 224 lb 6.4 oz (101.8 kg), SpO2 99 %.  Review of Systems  Constitutional: Negative.  Negative for chills and fever.  HENT: Negative.   Eyes: Negative.   Respiratory: Negative.   Cardiovascular: Negative.   Gastrointestinal: Negative.   Genitourinary: Negative.   Musculoskeletal: Negative.   Skin: Positive for wound.  Neurological: Negative.  Negative for tremors, weakness and numbness.       Objective:   Physical Exam  Constitutional: He appears well-developed and well-nourished. No distress.  Musculoskeletal: Normal range of motion. He exhibits tenderness. He exhibits no edema.  Neurological: He has normal reflexes. He exhibits normal muscle tone. Coordination normal.  Skin: He is not diaphoretic.  Left thenar prominence with jagged v shaped 2x2cm skin tear/laceration, no hemorrhage, good distal cap refill, full ROM left thumb, good distal sensation.       Assessment & Plan:  1. Laceration of left hand without foreign body, initial encounter No evidence of tendon/nerve damage, will treat with bactrim, discussed cleaning/caring suggestions

## 2016-10-28 NOTE — Patient Instructions (Signed)
Laceration Care, Adult A laceration is a cut that goes through all of the layers of the skin and into the tissue that is right under the skin. Some lacerations heal on their own. Others need to be closed with stitches (sutures), staples, skin adhesive strips, or skin glue. Proper laceration care minimizes the risk of infection and helps the laceration to heal better. How is this treated? If skin adhesive strips were used:   Keep the wound clean and dry.  If you were given a bandage (dressing), you should change it at least one time per day or as told by your health care provider. You should also change it if it becomes dirty or wet.  Do not get the skin adhesive strips wet. You may shower or bathe, but be careful to keep the wound dry.  If the wound gets wet, pat it dry with a clean towel. Do not rub the wound.  Skin adhesive strips fall off on their own. You may trim the strips as the wound heals. Do not remove skin adhesive strips that are still stuck to the wound. They will fall off in time. General Instructions   Take over-the-counter and prescription medicines only as told by your health care provider.  If you were prescribed an antibiotic medicine or ointment, take or apply it as told by your doctor. Do not stop using it even if your condition improves.  To help prevent scarring, make sure to cover your wound with sunscreen whenever you are outside after stitches are removed, after adhesive strips are removed, or when glue remains in place and the wound is healed. Make sure to wear a sunscreen of at least 30 SPF.  Do not scratch or pick at the wound.  Keep all follow-up visits as told by your health care provider. This is important.  Check your wound every day for signs of infection. Watch for:  Redness, swelling, or pain.  Fluid, blood, or pus.  Raise (elevate) the injured area above the level of your heart while you are sitting or lying down, if possible. Contact a health  care provider if:  You received a tetanus shot and you have swelling, severe pain, redness, or bleeding at the injection site.  You have a fever.  A wound that was closed breaks open.  You notice a bad smell coming from your wound or your dressing.  You notice something coming out of the wound, such as wood or glass.  Your pain is not controlled with medicine.  You have increased redness, swelling, or pain at the site of your wound.  You have fluid, blood, or pus coming from your wound.  You notice a change in the color of your skin near your wound.  You need to change the dressing frequently due to fluid, blood, or pus draining from the wound.  You develop a new rash.  You develop numbness around the wound. Get help right away if:  You develop severe swelling around the wound.  Your pain suddenly increases and is severe.  You develop painful lumps near the wound or on skin that is anywhere on your body.  You have a red streak going away from your wound.  The wound is on your hand or foot and you cannot properly move a finger or toe.  The wound is on your hand or foot and you notice that your fingers or toes look pale or bluish. This information is not intended to replace advice given to you  by your health care provider. Make sure you discuss any questions you have with your health care provider. Document Released: 07/22/2005 Document Revised: 12/22/2015 Document Reviewed: 07/18/2014 Elsevier Interactive Patient Education  2017 [employer].

## 2016-11-10 ENCOUNTER — Other Ambulatory Visit: Payer: Self-pay | Admitting: Physician Assistant

## 2017-02-24 ENCOUNTER — Other Ambulatory Visit: Payer: Self-pay | Admitting: Internal Medicine

## 2017-02-24 DIAGNOSIS — F411 Generalized anxiety disorder: Secondary | ICD-10-CM

## 2017-02-25 NOTE — Telephone Encounter (Signed)
Please call Alpraz  

## 2017-04-27 NOTE — Patient Instructions (Signed)

## 2017-04-27 NOTE — Progress Notes (Addendum)
[facility]   [facility], M.D.     Matthew Salinas. Matthew Salinas, P.A.-C Matthew Salinas, Matthew Salinas                [address] [place], [place] [address] Telephone [phone] Telefax [phone] Annual  Screening/Preventative Visit  & Comprehensive Evaluation & Examination     This very nice 50 y.o. [name] presents for a Screening/Preventative Visit & comprehensive evaluation and management of multiple medical co-morbidities.  Patient has been followed for HTN, HLD, Prediabetes and Vit D Deficiency.     HTN predates circa 2005. Patient's BP has been controlled at home.  Today's BP was sl elevated and normalized on recheck - 134/84. Patient denies any cardiac symptoms as chest pain, palpitations, shortness of breath, dizziness or ankle swelling.     Patient's hyperlipidemia is not controlled with diet and note he is statin Intolerant. He had been on Zetia in the past and self d/c'd.  Last lipids were not at goal: Lab Results  Component Value Date   CHOL 279 (H) 10/09/2016   HDL 57 10/09/2016   LDLCALC 154 (H) 10/09/2016   TRIG 341 (H) 10/09/2016   CHOLHDL 4.9 10/09/2016      Patient monitored expectantly for  Prediabetes and patient denies reactive hypoglycemic symptoms, visual blurring, diabetic polys or paresthesias. Last A1c was at goal: Lab Results  Component Value Date   HGBA1C 5.0 07/15/2016        Finally, patient has history of Vitamin D Deficiency of    and last vitamin D was still relatively low (goal  70-100): Lab Results  Component Value Date   VD25OH 56 03/27/2016   Current Outpatient Prescriptions on File Prior to Visit  Medication Sig  . ALPRAZolam  1 MG  Take 1/2 to 1 tablet 2 to 3 x / day only if needed for anxiety attack  . ANUCORT-HC 25 MG supp INSERT ONE SUPP RECTALLY 3 x  DAILY AS NEEDED  . VITAMIN D 1000 UNITS  Take 2,000 Units  daily.  Matthew Salinas Matthew Salinas 10 MG  TAKE ONE  TAB DAILY  . lidocaine-prilocaine (EMLA) cream APPLY TOPICALLY AS NEEDED  . lisinopril-hctz 20-25 MG tablet TAKE ONE TAB IN THE MORNING   . Multiple Vitamins-Minerals  Take by mouth.  Marland Kitchen PROCTOSOL HC 2.5 % rec crm APPLY  CREAM RECTALLY  4 TIMES DAILY   Allergies  Allergen Reactions  . Lipitor [Atorvastatin]     Myalgias  . Prozac [Fluoxetine Hcl]   . Simvastatin     Myalgias   Past Medical History:  Diagnosis Date  . Matthew   . GERD (gastroesophageal reflux disease)   . Hyperlipidemia   . Hypertension   . IBS (irritable bowel syndrome)   . Sleep apnea 12/04/2015  . Vitamin D deficiency    Health Maintenance  Topic Date Due  . INFLUENZA VACCINE  03/05/2017  . COLONOSCOPY  06/27/2025  . TETANUS/TDAP  03/27/2026  . Hepatitis C Screening  Completed  . HIV Screening  Completed   Immunization History  Administered Date(s) Administered  . PPD Test 01/19/2014, 03/27/2016, 04/28/2017  . Pneumococcal-Unspecified 01/06/2012  . Td 06/10/2006  . Tdap 03/27/2016   Past Surgical History:  Procedure Laterality Date  . CHEST TUBE INSERTION Right 05-15-02   thoracostomy  . TOE SURGERY  2008   right big toe  Family History  Problem Relation Age of Onset  . Cancer Mother        lung  . Hypertension Father   . Hyperlipidemia Father   . Dementia Brother   . Colon cancer Maternal Grandmother    Social History   Social History  . Marital status: Married    Spouse name: N/A  . Number of children: N/A  . Years of education: N/A   Occupational History  . Not on file.   Social History Main Topics  . Smoking status: Former Smoker    Quit date: 08/05/2001  . Smokeless tobacco: Never Used     Comment: Smoked 5-10 cigarettes per day since age 5  . Alcohol use 6.0 oz/week    10 Standard drinks or equivalent per week  . Drug use: No  . Sexual activity: Not on file    ROS Constitutional: Denies fever, chills, weight loss/gain, headaches, insomnia,  night sweats or change in  appetite. Does c/o fatigue. Eyes: Denies redness, blurred vision, diplopia, discharge, itchy or watery eyes.  ENT: Denies discharge, congestion, post nasal drip, epistaxis, sore throat, earache, hearing loss, dental pain, Tinnitus, Vertigo, Sinus pain or snoring.  Cardio: Denies chest pain, palpitations, irregular heartbeat, syncope, dyspnea, diaphoresis, orthopnea, PND, claudication or edema Respiratory: denies cough, dyspnea, DOE, pleurisy, hoarseness, laryngitis or wheezing.  Gastrointestinal: Denies dysphagia, heartburn, reflux, water brash, pain, cramps, nausea, vomiting, bloating, diarrhea, constipation, hematemesis, melena, hematochezia, jaundice or hemorrhoids Genitourinary: Denies dysuria, frequency, urgency, nocturia, hesitancy, discharge, hematuria or flank pain Musculoskeletal: Denies arthralgia, myalgia, stiffness, Jt. Swelling, pain, limp or strain/sprain. Denies Falls. Skin: Denies puritis, rash, hives, warts, acne, eczema or change in skin lesion Neuro: No weakness, tremor, incoordination, spasms, paresthesia or pain Psychiatric: Denies confusion, memory loss or sensory loss. Denies Depression. Endocrine: Denies change in weight, skin, hair change, nocturia, and paresthesia, diabetic polys, visual blurring or hyper / hypo glycemic episodes.  Heme/Lymph: No excessive bleeding, bruising or enlarged lymph nodes.  Physical Exam  BP 134/84   Pulse 62   Temp 97.6 F (36.4 C)   Resp 18   Ht 5' 10.5" (1.791 m)   Wt 215 lb (97.5 kg)   BMI 30.41 kg/m   General Appearance: Well nourished and well groomed and in no apparent distress.  Eyes: PERRLA, EOMs, conjunctiva no swelling or erythema, normal fundi and vessels. Sinuses: No frontal/maxillary tenderness ENT/Mouth: EACs patent / TMs  nl. Nares clear without erythema, swelling, mucoid exudates. Oral hygiene is good. No erythema, swelling, or exudate. Tongue normal, non-obstructing. Tonsils not swollen or erythematous. Hearing  normal.  Neck: Supple, thyroid normal. No bruits, nodes or JVD. Respiratory: Respiratory effort normal.  BS equal and clear bilateral without rales, rhonci, wheezing or stridor. Cardio: Heart sounds are normal with regular rate and rhythm and no murmurs, rubs or gallops. Peripheral pulses are normal and equal bilaterally without edema. No aortic or femoral bruits. Chest: symmetric with normal excursions and percussion.  Abdomen: Soft, with Nl bowel sounds. Nontender, no guarding, rebound, hernias, masses, or organomegaly.  Lymphatics: Non tender without lymphadenopathy.  Genitourinary: No hernias.Testes nl. DRE - prostate nl for age - smooth & firm w/o nodules. Musculoskeletal: Full ROM all peripheral extremities, joint stability, 5/5 strength, and normal gait. Skin: Warm and dry without rashes, lesions, cyanosis, clubbing or  ecchymosis.  Neuro: Cranial nerves intact, reflexes equal bilaterally. Normal muscle tone, no cerebellar symptoms. Sensation intact.  Pysch: Alert and oriented X 3 with normal affect, insight and judgment appropriate.  Assessment and Plan  1. Annual Preventative/Screening Exam   2. Essential hypertension  - EKG 12-Lead - [place], RETROPERITNL ABD,  LTD - Urinalysis, Routine w reflex microscopic - Microalbumin / creatinine urine ratio - CBC with Differential/Platelet - BASIC METABOLIC PANEL WITH GFR - Magnesium - TSH  3. Hyperlipidemia, mixed  - EKG 12-Lead - [place], RETROPERITNL ABD,  LTD - Hepatic function panel - Lipid panel - TSH  4. Prediabetes  - EKG 12-Lead - [place], RETROPERITNL ABD,  LTD - Hemoglobin A1c - Insulin, fasting  5. Vitamin D deficiency  - VITAMIN D 25 Hydroxy  6. Screening for colorectal cancer  - POC Hemoccult Bld/Stl   7. Prostate cancer screening  - PSA  8. Screening for ischemic heart disease  - EKG 12-Lead  9. Screening for AAA (aortic abdominal aneurysm)  - [place], RETROPERITNL ABD,  LTD  10. OSA (obstructive sleep  apnea)   11. Screening examination for pulmonary tuberculosis  - Iron,Total/Total Iron Binding Cap - Vitamin B12 - Testosterone - CBC with Differential/Platelet - PPD  12. Fatigue - Urinalysis, Routine w reflex microscopic - Microalbumin / creatinine urine ratio - CBC with Differential/Platelet       Patient was counseled in prudent diet, weight control to achieve/maintain BMI less than 25, BP monitoring, regular exercise and medications as discussed.  Discussed med effects and SE's. Routine screening labs and tests as requested with regular follow-up as recommended. Over 40 minutes of exam, counseling, chart review and high complex critical decision making was performed

## 2017-04-28 ENCOUNTER — Encounter: Payer: Self-pay | Admitting: Internal Medicine

## 2017-04-28 ENCOUNTER — Ambulatory Visit (INDEPENDENT_AMBULATORY_CARE_PROVIDER_SITE_OTHER): Payer: BC Managed Care – PPO | Admitting: Internal Medicine

## 2017-04-28 VITALS — BP 134/84 | HR 62 | Temp 97.6°F | Resp 18 | Ht 70.5 in | Wt 215.0 lb

## 2017-04-28 DIAGNOSIS — E291 Testicular hypofunction: Secondary | ICD-10-CM | POA: Diagnosis not present

## 2017-04-28 DIAGNOSIS — Z0001 Encounter for general adult medical examination with abnormal findings: Secondary | ICD-10-CM

## 2017-04-28 DIAGNOSIS — Z125 Encounter for screening for malignant neoplasm of prostate: Secondary | ICD-10-CM

## 2017-04-28 DIAGNOSIS — R5383 Other fatigue: Secondary | ICD-10-CM

## 2017-04-28 DIAGNOSIS — Z79899 Other long term (current) drug therapy: Secondary | ICD-10-CM

## 2017-04-28 DIAGNOSIS — Z136 Encounter for screening for cardiovascular disorders: Secondary | ICD-10-CM

## 2017-04-28 DIAGNOSIS — Z1212 Encounter for screening for malignant neoplasm of rectum: Secondary | ICD-10-CM

## 2017-04-28 DIAGNOSIS — G4733 Obstructive sleep apnea (adult) (pediatric): Secondary | ICD-10-CM

## 2017-04-28 DIAGNOSIS — E782 Mixed hyperlipidemia: Secondary | ICD-10-CM

## 2017-04-28 DIAGNOSIS — I10 Essential (primary) hypertension: Secondary | ICD-10-CM

## 2017-04-28 DIAGNOSIS — Z1211 Encounter for screening for malignant neoplasm of colon: Secondary | ICD-10-CM

## 2017-04-28 DIAGNOSIS — E559 Vitamin D deficiency, unspecified: Secondary | ICD-10-CM

## 2017-04-28 DIAGNOSIS — Z111 Encounter for screening for respiratory tuberculosis: Secondary | ICD-10-CM

## 2017-04-28 DIAGNOSIS — Z0000 Encounter for general adult medical examination without abnormal findings: Secondary | ICD-10-CM

## 2017-04-28 DIAGNOSIS — R7303 Prediabetes: Secondary | ICD-10-CM

## 2017-04-28 MED ORDER — MELOXICAM 15 MG PO TABS: ORAL_TABLET | ORAL | 1 refills | Status: DC

## 2017-04-29 ENCOUNTER — Other Ambulatory Visit: Payer: Self-pay | Admitting: Internal Medicine

## 2017-04-29 DIAGNOSIS — E782 Mixed hyperlipidemia: Secondary | ICD-10-CM

## 2017-04-29 LAB — CBC WITH DIFFERENTIAL/PLATELET
BASOS PCT: 1 %
Basophils Absolute: 60 cells/uL (ref 0–200)
EOS PCT: 1.7 %
Eosinophils Absolute: 102 cells/uL (ref 15–500)
HEMATOCRIT: 43.5 % (ref 38.5–50.0)
HEMOGLOBIN: 15 g/dL (ref 13.2–17.1)
LYMPHS ABS: 2184 {cells}/uL (ref 850–3900)
MCH: 31.6 pg (ref 27.0–33.0)
MCHC: 34.5 g/dL (ref 32.0–36.0)
MCV: 91.6 fL (ref 80.0–100.0)
MPV: 11 fL (ref 7.5–12.5)
Monocytes Relative: 7.3 %
NEUTROS ABS: 3216 {cells}/uL (ref 1500–7800)
NEUTROS PCT: 53.6 %
Platelets: 274 10*3/uL (ref 140–400)
RBC: 4.75 10*6/uL (ref 4.20–5.80)
RDW: 12.1 % (ref 11.0–15.0)
Total Lymphocyte: 36.4 %
WBC: 6 10*3/uL (ref 3.8–10.8)
WBCMIX: 438 {cells}/uL (ref 200–950)

## 2017-04-29 LAB — URINALYSIS, ROUTINE W REFLEX MICROSCOPIC
Bilirubin Urine: NEGATIVE
Glucose, UA: NEGATIVE
Hgb urine dipstick: NEGATIVE
KETONES UR: NEGATIVE
Leukocytes, UA: NEGATIVE
NITRITE: NEGATIVE
Protein, ur: NEGATIVE
SPECIFIC GRAVITY, URINE: 1.018 (ref 1.001–1.03)
pH: 5.5 (ref 5.0–8.0)

## 2017-04-29 LAB — HEMOGLOBIN A1C
EAG (MMOL/L): 5.5 (calc)
Hgb A1c MFr Bld: 5.1 % of total Hgb (ref <5.7)
Mean Plasma Glucose: 100 (calc)

## 2017-04-29 LAB — BASIC METABOLIC PANEL WITH GFR
BUN: 15 mg/dL (ref 7–25)
CALCIUM: 9.3 mg/dL (ref 8.6–10.3)
CHLORIDE: 100 mmol/L (ref 98–110)
CO2: 26 mmol/L (ref 20–32)
Creat: 1.04 mg/dL (ref 0.70–1.33)
GFR, EST AFRICAN AMERICAN: 95 mL/min/{1.73_m2} (ref >=60)
GFR, Est Non African American: 82 mL/min/{1.73_m2} (ref >=60)
Glucose, Bld: 97 mg/dL (ref 65–99)
POTASSIUM: 4.1 mmol/L (ref 3.5–5.3)
Sodium: 136 mmol/L (ref 135–146)

## 2017-04-29 LAB — HEPATIC FUNCTION PANEL
AG Ratio: 1.6 (calc) (ref 1.0–2.5)
ALBUMIN MSPROF: 4.4 g/dL (ref 3.6–5.1)
ALT: 25 U/L (ref 9–46)
AST: 27 U/L (ref 10–35)
Alkaline phosphatase (APISO): 63 U/L (ref 40–115)
BILIRUBIN DIRECT: 0.1 mg/dL (ref 0.0–0.2)
BILIRUBIN INDIRECT: 0.9 mg/dL (ref 0.2–1.2)
Globulin: 2.7 g/dL (calc) (ref 1.9–3.7)
TOTAL PROTEIN: 7.1 g/dL (ref 6.1–8.1)
Total Bilirubin: 1 mg/dL (ref 0.2–1.2)

## 2017-04-29 LAB — VITAMIN B12: Vitamin B-12: 581 pg/mL (ref 200–1100)

## 2017-04-29 LAB — MICROALBUMIN / CREATININE URINE RATIO
CREATININE, URINE: 128 mg/dL (ref 20–320)
MICROALB UR: 0.8 mg/dL
Microalb Creat Ratio: 6 mcg/mg creat (ref <30)

## 2017-04-29 LAB — IRON, TOTAL/TOTAL IRON BINDING CAP
%SAT: 41 % (ref 15–60)
Iron: 128 ug/dL (ref 50–180)
TIBC: 309 mcg/dL (calc) (ref 250–425)

## 2017-04-29 LAB — PSA: PSA: 0.6 ng/mL (ref <=4.0)

## 2017-04-29 LAB — LIPID PANEL
CHOLESTEROL: 284 mg/dL — AB (ref <200)
HDL: 30 mg/dL — ABNORMAL LOW (ref >40)
Non-HDL Cholesterol (Calc): 254 mg/dL (calc) — ABNORMAL HIGH (ref <130)
TRIGLYCERIDES: 1218 mg/dL — AB (ref <150)
Total CHOL/HDL Ratio: 9.5 (calc) — ABNORMAL HIGH (ref <5.0)

## 2017-04-29 LAB — INSULIN, RANDOM: Insulin: 4.4 u[IU]/mL (ref 2.0–19.6)

## 2017-04-29 LAB — TESTOSTERONE: Testosterone: 422 ng/dL (ref 250–827)

## 2017-04-29 LAB — TSH: TSH: 2.53 m[IU]/L (ref 0.40–4.50)

## 2017-04-29 LAB — MAGNESIUM: MAGNESIUM: 2 mg/dL (ref 1.5–2.5)

## 2017-04-29 LAB — VITAMIN D 25 HYDROXY (VIT D DEFICIENCY, FRACTURES): Vit D, 25-Hydroxy: 35 ng/mL (ref 30–100)

## 2017-04-29 MED ORDER — EZETIMIBE 10 MG PO TABS: ORAL_TABLET | ORAL | 1 refills | Status: DC

## 2017-04-29 MED ORDER — FENOFIBRATE 145 MG PO TABS: ORAL_TABLET | ORAL | 1 refills | Status: DC

## 2017-05-05 ENCOUNTER — Other Ambulatory Visit: Payer: Self-pay | Admitting: Internal Medicine

## 2017-05-05 DIAGNOSIS — E782 Mixed hyperlipidemia: Secondary | ICD-10-CM

## 2017-05-08 ENCOUNTER — Other Ambulatory Visit: Payer: BC Managed Care – PPO

## 2017-05-08 DIAGNOSIS — E782 Mixed hyperlipidemia: Secondary | ICD-10-CM

## 2017-05-09 ENCOUNTER — Other Ambulatory Visit: Payer: Self-pay | Admitting: *Deleted

## 2017-05-09 MED ORDER — LORATADINE 10 MG PO TABS: 10.0000 mg | ORAL_TABLET | Freq: Every day | ORAL | 3 refills | Status: DC

## 2017-05-12 ENCOUNTER — Other Ambulatory Visit: Payer: Self-pay

## 2017-05-13 LAB — CARDIO IQ(R) ADVANCED LIPID PANEL
Apolipoprotein B: 146 mg/dL — ABNORMAL HIGH (ref 52–109)
CHOL/HDL RATIO: 4.6 calc (ref <5.0)
CHOLESTEROL: 275 mg/dL — AB (ref <200)
HDL: 60 mg/dL (ref >40)
LDL Cholesterol (Calc): 178 mg/dL — ABNORMAL HIGH (ref <100)
LDL Large: 4386 nmol/L (ref 3382–9376)
LDL MEDIUM: 255 nmol/L (ref 122–498)
LDL PARTICLE NUMBER: 2305 nmol/L — AB (ref 732–2035)
LDL PEAK SIZE: 205.9 Angstrom — AB (ref >=217)
LDL Small: 529 nmol/L — ABNORMAL HIGH (ref 85–473)
Lipoprotein (a): 23 nmol/L (ref <75)
NON-HDL CHOLESTEROL (CALC): 215 mg/dL — AB (ref <130)
TRIGLYCERIDES: 209 mg/dL — AB (ref <150)

## 2017-05-13 LAB — TB SKIN TEST
Induration: 0 mm
TB Skin Test: NEGATIVE

## 2017-05-16 ENCOUNTER — Other Ambulatory Visit: Payer: Self-pay

## 2017-05-16 DIAGNOSIS — Z1211 Encounter for screening for malignant neoplasm of colon: Secondary | ICD-10-CM

## 2017-05-16 DIAGNOSIS — Z1212 Encounter for screening for malignant neoplasm of rectum: Principal | ICD-10-CM

## 2017-05-16 LAB — POC HEMOCCULT BLD/STL (HOME/3-CARD/SCREEN)
Card #2 Fecal Occult Blod, POC: NEGATIVE
Card #3 Fecal Occult Blood, POC: NEGATIVE
Fecal Occult Blood, POC: NEGATIVE

## 2017-05-19 ENCOUNTER — Other Ambulatory Visit: Payer: Self-pay | Admitting: Physician Assistant

## 2017-05-22 ENCOUNTER — Encounter: Payer: Self-pay | Admitting: Internal Medicine

## 2017-06-10 ENCOUNTER — Other Ambulatory Visit: Payer: Self-pay

## 2017-06-10 MED ORDER — CYCLOBENZAPRINE HCL 10 MG PO TABS: 10.0000 mg | ORAL_TABLET | Freq: Three times a day (TID) | ORAL | 1 refills | Status: DC | PRN

## 2017-06-16 ENCOUNTER — Other Ambulatory Visit: Payer: Self-pay | Admitting: Internal Medicine

## 2017-08-17 DIAGNOSIS — E669 Obesity, unspecified: Secondary | ICD-10-CM | POA: Insufficient documentation

## 2017-08-17 DIAGNOSIS — E663 Overweight: Secondary | ICD-10-CM | POA: Insufficient documentation

## 2017-08-17 NOTE — Progress Notes (Signed)
FOLLOW UP  Assessment and Plan:   Hypertension Well controlled with current medications  Monitor blood pressure at home; patient to call if consistently greater than 130/80 Continue DASH diet.   Reminder to go to the ER if any CP, SOB, nausea, dizziness, severe HA, changes vision/speech, left arm numbness and tingling and jaw pain.  Cholesterol LDL and total cholesterol remain poorly controlled; statin intolerant, has not been taking prescribed zetia or gemfibrozil - has been taking citrucel, would prefer to work on diet. Refuses medications due to concerns with SE.  Continue low cholesterol diet and exercise.  Check lipid panel.   Other abnormal glucose Recent A1Cs well controlled Continue diet and exercise.  Perform daily foot/skin check, notify office of any concerning changes.  Defer A1C; check BMP  Obesity with co morbidities Long discussion about weight loss, diet, and exercise Recommended diet heavy in fruits and veggies and low in animal meats, cheeses, and dairy products, appropriate calorie intake Discussed ideal weight for height and initial weight goal (200 lb) Will follow up in 3 months  Vitamin D Def At goal at last visit; continue supplementation to maintain goal of 70-100 Defer Vit D level  Continue diet and meds as discussed. Further disposition pending results of labs. Discussed med's effects and SE's.   Over 30 minutes of exam, counseling, chart review, and critical decision making was performed.   Future Appointments  Date Time Provider [place]  11/24/2017  9:30 AM Unk Pinto, MD [facility] None  05/22/2018  9:00 AM Unk Pinto, MD [facility] None    ----------------------------------------------------------------------------------------------------------------------  HPI 52 y.o. male  presents for 3 month follow up on hypertension, cholesterol, diabetes, obesity and vitamin D deficiency.   he has a diagnosis of anxiety and is  currently on xanax PRN, reports symptoms are well controlled on current regimen. He takes at bedtime 1-2 times a week at night.   BMI is Body mass index is 30.41 kg/m., he has been working on diet and exercise. Wt Readings from Last 3 Encounters:  08/18/17 215 lb (97.5 kg)  04/28/17 215 lb (97.5 kg)  10/28/16 224 lb 6.4 oz (101.8 kg)   His blood pressure has been controlled at home, today their BP is BP: 134/82  He does workout. He denies chest pain, shortness of breath, dizziness.   He is prescribed medication but has not been taking - denies myalgias. He is reportedly intolerant of statins, refuses zetia and gemfibrozil due to potential SE.  His cholesterol is not at goal. The cholesterol last visit was:   Lab Results  Component Value Date   CHOL 275 (H) 05/08/2017   HDL 60 05/08/2017   LDLCALC 154 (H) 10/09/2016   TRIG 209 (H) 05/08/2017   CHOLHDL 4.6 05/08/2017    He has been working on diet and exercise for glucose management, and denies increased appetite, nausea, paresthesia of the feet, polydipsia, polyuria, visual disturbances and vomiting. Last A1C in the office was:  Lab Results  Component Value Date   HGBA1C 5.1 04/28/2017   Patient is on Vitamin D supplement; he did increase his dose after last visit:    Lab Results  Component Value Date   VD25OH 35 04/28/2017        Current Medications:  Current Outpatient Medications on File Prior to Visit  Medication Sig  . ALPRAZolam (XANAX) 1 MG tablet TAKE 1/2 TO 1 (ONE-HALF TO ONE) TABLET BY MOUTH TWO-THREE TIMES DAILY AS NEEDED FOR ANXIETY ATTACK  . ANUCORT-HC 25  MG suppository UNWRAP AND INSERT ONE SUPPOSITORY RECTALLY THREE TIMES DAILY AS NEEDED  . cholecalciferol (VITAMIN D) 1000 UNITS tablet Take 2,000 Units by mouth daily.  Marland Kitchen lidocaine-prilocaine (EMLA) cream APPLY TOPICALLY AS NEEDED  . lisinopril-hydrochlorothiazide (PRINZIDE,ZESTORETIC) 20-25 MG tablet TAKE 1 TABLET BY MOUTH IN THE MORNING FOR BLOOD PRESSURE  (Patient taking differently: TAKE 1/4 TABLET BY MOUTH IN THE MORNING FOR BLOOD PRESSURE)  . Multiple Vitamins-Minerals (MULTIVITAMIN PO) Take by mouth.  Marland Kitchen PROCTOSOL HC 2.5 % rectal cream APPLY  CREAM RECTALLY  4 TIMES DAILY  . cyclobenzaprine (FLEXERIL) 10 MG tablet Take 1 tablet (10 mg total) every 8 (eight) hours as needed by mouth for muscle spasms. (Patient not taking: Reported on 08/18/2017)  . ezetimibe (ZETIA) 10 MG tablet Take 1 tablet daily for Cholesterol to prevent heart attack and stroke (Patient not taking: Reported on 08/18/2017)  . fenofibrate (TRICOR) 145 MG tablet Take 1 tablet daily for Triglycerides (blood fats) (Patient not taking: Reported on 08/18/2017)  . loratadine (EQ ALLERGY RELIEF) 10 MG tablet Take 1 tablet (10 mg total) by mouth daily. (Patient not taking: Reported on 08/18/2017)  . meloxicam (MOBIC) 15 MG tablet Take 1/2 to 1 tablet daily with food for pain & Inflammation (Patient not taking: Reported on 08/18/2017)   No current facility-administered medications on file prior to visit.      Allergies:  Allergies  Allergen Reactions  . Lipitor [Atorvastatin]     Myalgias  . Prozac [Fluoxetine Hcl]   . Simvastatin     Myalgias     Medical History:  Past Medical History:  Diagnosis Date  . Allergy   . GERD (gastroesophageal reflux disease)   . Hyperlipidemia   . Hypertension   . IBS (irritable bowel syndrome)   . Sleep apnea 12/04/2015  . Vitamin D deficiency    Family history- Reviewed and unchanged Social history- Reviewed and unchanged   Review of Systems:  Review of Systems  Constitutional: Negative for malaise/fatigue and weight loss.  HENT: Negative for hearing loss and tinnitus.   Eyes: Negative for blurred vision and double vision.  Respiratory: Negative for cough, shortness of breath and wheezing.   Cardiovascular: Negative for chest pain, palpitations, orthopnea, claudication and leg swelling.  Gastrointestinal: Negative for abdominal pain,  blood in stool, constipation, diarrhea, heartburn, melena, nausea and vomiting.  Genitourinary: Negative.   Musculoskeletal: Negative for joint pain and myalgias.  Skin: Negative for rash.  Neurological: Negative for dizziness, tingling, sensory change, weakness and headaches.  Endo/Heme/Allergies: Negative for polydipsia.  Psychiatric/Behavioral: Negative.   All other systems reviewed and are negative.     Physical Exam: BP 134/82   Pulse 82   Temp (!) 97.5 F (36.4 C)   Ht 5' 10.5" (1.791 m)   Wt 215 lb (97.5 kg)   SpO2 99%   BMI 30.41 kg/m  Wt Readings from Last 3 Encounters:  08/18/17 215 lb (97.5 kg)  04/28/17 215 lb (97.5 kg)  10/28/16 224 lb 6.4 oz (101.8 kg)   General Appearance: Well nourished, in no apparent distress. Eyes: PERRLA, EOMs, conjunctiva no swelling or erythema Sinuses: No Frontal/maxillary tenderness ENT/Mouth: Ext aud canals clear, TMs without erythema, bulging. No erythema, swelling, or exudate on post pharynx.  Tonsils not swollen or erythematous -large at baseline bilaterally, 3+. Hearing normal.  Neck: Supple, thyroid normal.  Respiratory: Respiratory effort normal, BS equal bilaterally without rales, rhonchi, wheezing or stridor.  Cardio: RRR with no MRGs. Brisk peripheral pulses without edema.  Abdomen:  Soft, + BS.  Non tender, no guarding, rebound, hernias, masses. Lymphatics: Non tender without lymphadenopathy.  Musculoskeletal: Full ROM, 5/5 strength, Normal gait Skin: Warm, dry without rashes, lesions, ecchymosis.  Neuro: Cranial nerves intact. No cerebellar symptoms.  Psych: Awake and oriented X 3, normal affect, Insight and Judgment appropriate.    Izora Ribas, NP 9:11 AM [facility]

## 2017-08-18 ENCOUNTER — Encounter: Payer: Self-pay | Admitting: Adult Health

## 2017-08-18 ENCOUNTER — Ambulatory Visit (INDEPENDENT_AMBULATORY_CARE_PROVIDER_SITE_OTHER): Payer: BC Managed Care – PPO | Admitting: Adult Health

## 2017-08-18 VITALS — BP 134/82 | HR 82 | Temp 97.5°F | Ht 70.5 in | Wt 215.0 lb

## 2017-08-18 DIAGNOSIS — I10 Essential (primary) hypertension: Secondary | ICD-10-CM

## 2017-08-18 DIAGNOSIS — E669 Obesity, unspecified: Secondary | ICD-10-CM | POA: Diagnosis not present

## 2017-08-18 DIAGNOSIS — R7309 Other abnormal glucose: Secondary | ICD-10-CM | POA: Diagnosis not present

## 2017-08-18 DIAGNOSIS — Z79899 Other long term (current) drug therapy: Secondary | ICD-10-CM | POA: Diagnosis not present

## 2017-08-18 DIAGNOSIS — E66811 Obesity, class 1: Secondary | ICD-10-CM

## 2017-08-18 DIAGNOSIS — E782 Mixed hyperlipidemia: Secondary | ICD-10-CM

## 2017-08-18 DIAGNOSIS — E559 Vitamin D deficiency, unspecified: Secondary | ICD-10-CM | POA: Diagnosis not present

## 2017-08-18 NOTE — Patient Instructions (Signed)
Fat and Cholesterol Restricted Diet Getting too much fat and cholesterol in your diet may cause health problems. Following this diet helps keep your fat and cholesterol at normal levels. This can keep you from getting sick. What types of fat should I choose?  Choose monosaturated and polyunsaturated fats. These are found in foods such as olive oil, canola oil, flaxseeds, walnuts, almonds, and seeds.  Eat more omega-3 fats. Good choices include salmon, mackerel, sardines, tuna, flaxseed oil, and ground flaxseeds.  Limit saturated fats. These are in animal products such as meats, butter, and cream. They can also be in plant products such as palm oil, palm kernel oil, and coconut oil.  Avoid foods with partially hydrogenated oils in them. These contain trans fats. Examples of foods that have trans fats are stick margarine, some tub margarines, cookies, crackers, and other baked goods. What general guidelines do I need to follow?  Check food labels. Look for the words "trans fat" and "saturated fat."  When preparing a meal: ? Fill half of your plate with vegetables and green salads. ? Fill one fourth of your plate with whole grains. Look for the word "whole" as the first word in the ingredient list. ? Fill one fourth of your plate with lean protein foods.  Eat more foods that have fiber, like apples, carrots, beans, peas, and barley.  Eat more home-cooked foods. Eat less at restaurants and buffets.  Limit or avoid alcohol.  Limit foods high in starch and sugar.  Limit fried foods.  Cook foods without frying them. Baking, boiling, grilling, and broiling are all great options.  Lose weight if you are overweight. Losing even a small amount of weight can help your overall health. It can also help prevent diseases such as diabetes and heart disease. What foods can I eat? Grains Whole grains, such as whole wheat or whole grain breads, crackers, cereals, and pasta. Unsweetened oatmeal,  bulgur, barley, quinoa, or brown rice. Corn or whole wheat flour tortillas. Vegetables Fresh or frozen vegetables (raw, steamed, roasted, or grilled). Green salads. Fruits All fresh, canned (in natural juice), or frozen fruits. Meat and Other Protein Products Ground beef (85% or leaner), grass-fed beef, or beef trimmed of fat. Skinless chicken or turkey. Ground chicken or turkey. Pork trimmed of fat. All fish and seafood. Eggs. Dried beans, peas, or lentils. Unsalted nuts or seeds. Unsalted canned or dry beans. Dairy Low-fat dairy products, such as skim or 1% milk, 2% or reduced-fat cheeses, low-fat ricotta or cottage cheese, or plain low-fat yogurt. Fats and Oils Tub margarines without trans fats. Light or reduced-fat mayonnaise and salad dressings. Avocado. Olive, canola, sesame, or safflower oils. Natural peanut or almond butter (choose ones without added sugar and oil). The items listed above may not be a complete list of recommended foods or beverages. Contact your dietitian for more options. What foods are not recommended? Grains White bread. White pasta. White rice. Cornbread. Bagels, pastries, and croissants. Crackers that contain trans fat. Vegetables White potatoes. Corn. Creamed or fried vegetables. Vegetables in a cheese sauce. Fruits Dried fruits. Canned fruit in light or heavy syrup. Fruit juice. Meat and Other Protein Products Fatty cuts of meat. Ribs, chicken wings, bacon, sausage, bologna, salami, chitterlings, fatback, hot dogs, bratwurst, and packaged luncheon meats. Liver and organ meats. Dairy Whole or 2% milk, cream, half-and-half, and cream cheese. Whole milk cheeses. Whole-fat or sweetened yogurt. Full-fat cheeses. Nondairy creamers and whipped toppings. Processed cheese, cheese spreads, or cheese curds. Sweets and Desserts Corn   syrup, sugars, honey, and molasses. Candy. Jam and jelly. Syrup. Sweetened cereals. Cookies, pies, cakes, donuts, muffins, and ice  cream. Fats and Oils Butter, stick margarine, lard, shortening, ghee, or bacon fat. Coconut, palm kernel, or palm oils. Beverages Alcohol. Sweetened drinks (such as sodas, lemonade, and fruit drinks or punches). The items listed above may not be a complete list of foods and beverages to avoid. Contact your dietitian for more information. This information is not intended to replace advice given to you by your health care provider. Make sure you discuss any questions you have with your health care provider. Document Released: 01/21/2012 Document Revised: 03/28/2016 Document Reviewed: 10/21/2013 Elsevier Interactive Patient Education  2018 [employer]  

## 2017-08-19 LAB — HEPATIC FUNCTION PANEL
AG Ratio: 1.5 (calc) (ref 1.0–2.5)
ALKALINE PHOSPHATASE (APISO): 55 U/L (ref 40–115)
ALT: 31 U/L (ref 9–46)
AST: 26 U/L (ref 10–35)
Albumin: 5 g/dL (ref 3.6–5.1)
BILIRUBIN INDIRECT: 0.5 mg/dL (ref 0.2–1.2)
Bilirubin, Direct: 0.1 mg/dL (ref 0.0–0.2)
Globulin: 3.3 g/dL (calc) (ref 1.9–3.7)
TOTAL PROTEIN: 8.3 g/dL — AB (ref 6.1–8.1)
Total Bilirubin: 0.6 mg/dL (ref 0.2–1.2)

## 2017-08-19 LAB — BASIC METABOLIC PANEL WITH GFR
BUN: 13 mg/dL (ref 7–25)
CALCIUM: 10.2 mg/dL (ref 8.6–10.3)
CHLORIDE: 99 mmol/L (ref 98–110)
CO2: 29 mmol/L (ref 20–32)
Creat: 0.87 mg/dL (ref 0.70–1.33)
GFR, EST AFRICAN AMERICAN: 114 mL/min/{1.73_m2} (ref >=60)
GFR, Est Non African American: 99 mL/min/{1.73_m2} (ref >=60)
Glucose, Bld: 101 mg/dL — ABNORMAL HIGH (ref 65–99)
POTASSIUM: 4.5 mmol/L (ref 3.5–5.3)
Sodium: 137 mmol/L (ref 135–146)

## 2017-08-19 LAB — CBC WITH DIFFERENTIAL/PLATELET
BASOS ABS: 83 {cells}/uL (ref 0–200)
Basophils Relative: 1.3 %
EOS PCT: 1.6 %
Eosinophils Absolute: 102 cells/uL (ref 15–500)
HCT: 47.8 % (ref 38.5–50.0)
Hemoglobin: 16 g/dL (ref 13.2–17.1)
Lymphs Abs: 2093 cells/uL (ref 850–3900)
MCH: 30.9 pg (ref 27.0–33.0)
MCHC: 33.5 g/dL (ref 32.0–36.0)
MCV: 92.3 fL (ref 80.0–100.0)
MPV: 10.7 fL (ref 7.5–12.5)
Monocytes Relative: 7.1 %
NEUTROS PCT: 57.3 %
Neutro Abs: 3667 cells/uL (ref 1500–7800)
PLATELETS: 327 10*3/uL (ref 140–400)
RBC: 5.18 10*6/uL (ref 4.20–5.80)
RDW: 11.6 % (ref 11.0–15.0)
TOTAL LYMPHOCYTE: 32.7 %
WBC mixed population: 454 cells/uL (ref 200–950)
WBC: 6.4 10*3/uL (ref 3.8–10.8)

## 2017-08-19 LAB — LIPID PANEL
Cholesterol: 312 mg/dL — ABNORMAL HIGH (ref <200)
HDL: 67 mg/dL (ref >40)
LDL Cholesterol (Calc): 203 mg/dL (calc) — ABNORMAL HIGH
NON-HDL CHOLESTEROL (CALC): 245 mg/dL — AB (ref <130)
Total CHOL/HDL Ratio: 4.7 (calc) (ref <5.0)
Triglycerides: 220 mg/dL — ABNORMAL HIGH (ref <150)

## 2017-08-19 LAB — VITAMIN D 25 HYDROXY (VIT D DEFICIENCY, FRACTURES): Vit D, 25-Hydroxy: 52 ng/mL (ref 30–100)

## 2017-08-19 LAB — TSH: TSH: 2.16 mIU/L (ref 0.40–4.50)

## 2017-08-25 ENCOUNTER — Other Ambulatory Visit: Payer: Self-pay | Admitting: Physician Assistant

## 2017-09-09 ENCOUNTER — Telehealth: Payer: Self-pay | Admitting: Physician Assistant

## 2017-09-09 MED ORDER — AZITHROMYCIN 250 MG PO TABS: ORAL_TABLET | ORAL | 1 refills | Status: AC

## 2017-09-09 MED ORDER — PROMETHAZINE-DM 6.25-15 MG/5ML PO SYRP: 5.0000 mL | ORAL_SOLUTION | Freq: Four times a day (QID) | ORAL | 1 refills | Status: DC | PRN

## 2017-09-09 MED ORDER — PREDNISONE 20 MG PO TABS: ORAL_TABLET | ORAL | 0 refills | Status: DC

## 2017-09-09 NOTE — Telephone Encounter (Signed)
Patient notified

## 2017-09-09 NOTE — Telephone Encounter (Signed)
Patient with sinus infection x 2 weeks. He has HA, sinus drainage, sinus pressure, SOB, wheezing, cough. He is on mucinex, allegra, sinus headache meds without relief.   Will send in zpak, prednisone cough syrup, if not better needs OV.

## 2017-10-22 ENCOUNTER — Other Ambulatory Visit: Payer: Self-pay | Admitting: Physician Assistant

## 2017-11-23 NOTE — Progress Notes (Signed)
      C  A  N  C  E  L  L  E  D     A  T A  P  P  '  T           T  I  M  E  ( has a "stomach virus")

## 2017-11-24 ENCOUNTER — Ambulatory Visit: Payer: Self-pay | Admitting: Internal Medicine

## 2017-12-12 ENCOUNTER — Ambulatory Visit: Payer: Self-pay | Admitting: Adult Health

## 2017-12-17 NOTE — Progress Notes (Signed)
FOLLOW UP  Assessment and Plan:   Hypertension Well controlled with current medications  Monitor blood pressure at home; patient to call if consistently greater than 130/80 Continue DASH diet.   Reminder to go to the ER if any CP, SOB, nausea, dizziness, severe HA, changes vision/speech, left arm numbness and tingling and jaw pain.  Cholesterol LDL and total cholesterol remain poorly controlled; statin intolerant; declines all medications despite discussion of risks. Currently on red yeast rice.  Continue low cholesterol diet and exercise.  Check lipid panel.   Other abnormal glucose Recent A1Cs well controlled  Continue diet and exercise.  Perform daily foot/skin check, notify office of any concerning changes.  Defer A1C; check BMP  Obesity with co morbidities Long discussion about weight loss, diet, and exercise Recommended diet heavy in fruits and veggies and low in animal meats, cheeses, and dairy products, appropriate calorie intake Discussed ideal weight for height and  weight goal (200 lb) Will follow up in 3 months  Vitamin D Def Below goal at last visit; he did change dose; continue supplementation to maintain goal of 70-100 Check Vit D level  Anxiety Well managed by current regimen; continue medications Stress management techniques discussed, increase water, good sleep hygiene discussed, increase exercise, and increase veggies.   Continue diet and meds as discussed. Further disposition pending results of labs. Discussed med's effects and SE's.   Over 30 minutes of exam, counseling, chart review, and critical decision making was performed.   Future Appointments  Date Time Provider [place]  05/22/2018  9:00 AM Unk Pinto, MD [facility] None    ----------------------------------------------------------------------------------------------------------------------  HPI 52 y.o. male  presents for 3 month follow up on hypertension, cholesterol,  glucose management, obesity and vitamin D deficiency.   he has a diagnosis of anxiety and is currently on xanax PRN, reports symptoms are well controlled on current regimen. He takes at bedtime 1-2 times a week at night.   BMI is Body mass index is 29.99 kg/m., he has been working on diet and exercise. Wt Readings from Last 3 Encounters:  12/18/17 212 lb (96.2 kg)  08/18/17 215 lb (97.5 kg)  04/28/17 215 lb (97.5 kg)   His blood pressure has been controlled at home, today their BP is BP: 124/82  He does workout. He denies chest pain, shortness of breath, dizziness.   He is not on cholesterol medication, he understands risks but still declines all medications vehemently. His cholesterol is not at goal. The cholesterol last visit was:   Lab Results  Component Value Date   CHOL 312 (H) 08/18/2017   HDL 67 08/18/2017   LDLCALC 203 (H) 08/18/2017   TRIG 220 (H) 08/18/2017   CHOLHDL 4.7 08/18/2017    He has been working on diet and exercise for prediabetes, and denies foot ulcerations, increased appetite, nausea, paresthesia of the feet, polydipsia, polyuria, visual disturbances, vomiting and weight loss. Last A1C in the office was:  Lab Results  Component Value Date   HGBA1C 5.1 04/28/2017   Patient is on Vitamin D supplement.   Lab Results  Component Value Date   VD25OH 52 08/18/2017        Current Medications:  Current Outpatient Medications on File Prior to Visit  Medication Sig  . ALPRAZolam (XANAX) 1 MG tablet TAKE 1/2 TO 1 TABLET BY MOUTH  TWICE DAILY TO  THREE TIMES DAILY AS NEEDED FOR ANXIETY  . B Complex Vitamins (B COMPLEX PO) Take by mouth.  . cholecalciferol (VITAMIN D)  1000 UNITS tablet Take 2,000 Units by mouth daily.  Marland Kitchen lisinopril-hydrochlorothiazide (PRINZIDE,ZESTORETIC) 20-25 MG tablet TAKE 1 TABLET BY MOUTH IN THE MORNING FOR BLOOD PRESSURE (Patient taking differently: TAKE 1/2 TABLET BY MOUTH IN THE MORNING FOR BLOOD PRESSURE)  . Multiple Vitamins-Minerals  (MULTIVITAMIN PO) Take by mouth.  . Red Yeast Rice Extract (RED YEAST RICE PO) Take by mouth.   No current facility-administered medications on file prior to visit.      Allergies:  Allergies  Allergen Reactions  . Lipitor [Atorvastatin]     Myalgias  . Prozac [Fluoxetine Hcl]   . Simvastatin     Myalgias     Medical History:  Past Medical History:  Diagnosis Date  . Allergy   . GERD (gastroesophageal reflux disease)   . Hyperlipidemia   . Hypertension   . IBS (irritable bowel syndrome)   . Sleep apnea 12/04/2015  . Vitamin D deficiency    Family history- Reviewed and unchanged Social history- Reviewed and unchanged   Review of Systems:  Review of Systems  Constitutional: Negative for malaise/fatigue and weight loss.  HENT: Negative for hearing loss and tinnitus.   Eyes: Negative for blurred vision and double vision.  Respiratory: Negative for cough, shortness of breath and wheezing.   Cardiovascular: Negative for chest pain, palpitations, orthopnea, claudication and leg swelling.  Gastrointestinal: Negative for abdominal pain, blood in stool, constipation, diarrhea, heartburn, melena, nausea and vomiting.  Genitourinary: Negative.   Musculoskeletal: Negative for joint pain and myalgias.  Skin: Negative for rash.  Neurological: Negative for dizziness, tingling, sensory change, weakness and headaches.  Endo/Heme/Allergies: Negative for polydipsia.  Psychiatric/Behavioral: Negative.   All other systems reviewed and are negative.     Physical Exam: BP 124/82   Pulse 97   Temp (!) 97.5 F (36.4 C)   Ht 5' 10.5" (1.791 m)   Wt 212 lb (96.2 kg)   SpO2 97%   BMI 29.99 kg/m  Wt Readings from Last 3 Encounters:  12/18/17 212 lb (96.2 kg)  08/18/17 215 lb (97.5 kg)  04/28/17 215 lb (97.5 kg)   General Appearance: Well nourished, in no apparent distress. Eyes: PERRLA, EOMs, conjunctiva no swelling or erythema Sinuses: No Frontal/maxillary tenderness ENT/Mouth:  Ext aud canals clear, TMs without erythema, bulging. No erythema, swelling, or exudate on post pharynx.  Tonsils not swollen or erythematous. Hearing normal.  Neck: Supple, thyroid normal.  Respiratory: Respiratory effort normal, BS equal bilaterally without rales, rhonchi, wheezing or stridor.  Cardio: RRR with no MRGs. Brisk peripheral pulses without edema.  Abdomen: Soft, + BS.  Non tender, no guarding, rebound, hernias, masses. Lymphatics: Non tender without lymphadenopathy.  Musculoskeletal: Full ROM, 5/5 strength, Normal gait Skin: Warm, dry without rashes, lesions, ecchymosis.  Neuro: Cranial nerves intact. No cerebellar symptoms.  Psych: Awake and oriented X 3, normal affect, Insight and Judgment appropriate.    Izora Ribas, NP 9:53 AM [facility]

## 2017-12-18 ENCOUNTER — Telehealth: Payer: Self-pay

## 2017-12-18 ENCOUNTER — Encounter: Payer: Self-pay | Admitting: Adult Health

## 2017-12-18 ENCOUNTER — Ambulatory Visit: Payer: BC Managed Care – PPO | Admitting: Adult Health

## 2017-12-18 ENCOUNTER — Other Ambulatory Visit: Payer: Self-pay | Admitting: Adult Health

## 2017-12-18 VITALS — BP 124/82 | HR 97 | Temp 97.5°F | Ht 70.5 in | Wt 212.0 lb

## 2017-12-18 DIAGNOSIS — R7309 Other abnormal glucose: Secondary | ICD-10-CM | POA: Diagnosis not present

## 2017-12-18 DIAGNOSIS — Z79899 Other long term (current) drug therapy: Secondary | ICD-10-CM

## 2017-12-18 DIAGNOSIS — E669 Obesity, unspecified: Secondary | ICD-10-CM | POA: Diagnosis not present

## 2017-12-18 DIAGNOSIS — I10 Essential (primary) hypertension: Secondary | ICD-10-CM | POA: Diagnosis not present

## 2017-12-18 DIAGNOSIS — E559 Vitamin D deficiency, unspecified: Secondary | ICD-10-CM

## 2017-12-18 DIAGNOSIS — M6530 Trigger finger, unspecified finger: Secondary | ICD-10-CM

## 2017-12-18 DIAGNOSIS — E782 Mixed hyperlipidemia: Secondary | ICD-10-CM

## 2017-12-18 DIAGNOSIS — F419 Anxiety disorder, unspecified: Secondary | ICD-10-CM | POA: Diagnosis not present

## 2017-12-18 NOTE — Patient Instructions (Addendum)
Try stevia + erythritol sweetener instead of brown sugar   Try flonase - 1 spray in each nostril daily   Otitis Media With Effusion, Pediatric Otitis media with effusion (OME) occurs when there is inflammation of the middle ear and fluid in the middle ear space. There are no signs and symptoms of infection. The middle ear space contains air and the bones for hearing. Air in the middle ear space helps to transmit sound to the brain. OME is a common condition in children, and it often occurs after an ear infection. This condition may be present for several weeks or longer after an ear infection. Most cases of this condition get better on their own. What are the causes? OME is caused by a blockage of the eustachian tube in one or both ears. These tubes drain fluid in the ears to the back of the nose (nasopharynx). If the tissue in the tube swells up (edema), the tube closes. This prevents fluid from draining. Blockage can be caused by:  Ear infections.  Colds and other upper respiratory infections.  Allergies.  Irritants, such as tobacco smoke.  Enlarged adenoids. The adenoids are areas of soft tissue located high in the back of the throat, behind the nose and the roof of the mouth. They are part of the body's natural defense (immune) system.  A mass in the nasopharynx.  Damage to the ear caused by pressure changes (barotrauma).  What increases the risk? Your child is more likely to develop this condition if:  He or she has repeated ear and sinus infections.  He or she has allergies.  He or she is exposed to tobacco smoke.  He or she attends daycare.  He or she is not breastfed.  What are the signs or symptoms? Symptoms of this condition may not be obvious. Sometimes this condition does not have any symptoms, or symptoms may overlap with those of a cold or upper respiratory tract illness. Symptoms of this condition include:  Temporary hearing loss.  A feeling of fullness in  the ear without pain.  Irritability or agitation.  Balance (vestibular) problems.  As a result of hearing loss, your child may:  Listen to the TV at a loud volume.  Not respond to questions.  Ask "What?" often when spoken to.  Mistake or confuse one sound or word for another.  Perform poorly at school.  Have a poor attention span.  Become agitated or irritated easily.  How is this diagnosed? This condition is diagnosed with an ear exam. Your child's health care provider will look inside your child's ear with an instrument (otoscope) to check for redness, swelling, and fluid. Other tests may be done, including:  A test to check the movement of the eardrum (pneumatic otoscopy). This is done by squeezing a small amount of air into the ear.  A test that changes air pressure in the middle ear to check how well the eardrum moves and to see if the eustachian tube is working (tympanogram).  Hearing test (audiogram). This test involves playing tones at different pitches to see if your child can hear each tone.  How is this treated? Treatment for this condition depends on the cause. In many cases, the fluid goes away on its own. In some cases, your child may need a procedure to create a hole in the eardrum to allow fluid to drain (myringotomy) and to insert small drainage tubes (tympanostomy tubes) into the eardrums. These tubes help to drain fluid and prevent  infection. This procedure may be recommended if:  OME does not get better over several months.  Your child has many ear infections within several months.  Your child has noticeable hearing loss.  Your child has problems with speech and language development.  Surgery may also be done to remove the adenoids (adenoidectomy). Follow these instructions at home:  Give over-the-counter and prescription medicines only as told by your child's health care provider.  Keep children away from any tobacco smoke.  Keep all follow-up  visits as told by your child's health care provider. This is important. How is this prevented?  Keep your child's vaccinations up to date. Make sure your child gets all recommended vaccinations, including a pneumonia and flu vaccine.  Encourage hand washing. Your child should wash his or her hands often with soap and water. If there is no soap and water, he or she should use hand sanitizer.  Avoid exposing your child to tobacco smoke.  Breastfeed your baby, if possible. Babies who are breastfed as long as possible are less likely to develop this condition. Contact a health care provider if:  Your child's hearing does not get better after 3 months.  Your child's hearing is worse.  Your child has ear pain.  Your child has a fever.  Your child has drainage from the ear.  Your child is dizzy.  Your child has a lump on his or her neck. Get help right away if:  Your child has bleeding from the nose.  Your child cannot move part of her or his face.  Your child has trouble breathing.  Your child cannot smell.  Your child develops severe congestion.  Your child develops weakness.  Your child who is younger than 3 months has a temperature of 100F (38C) or higher. Summary  Otitis media with effusion (OME) occurs when there is inflammation of the middle ear and fluid in the middle ear space.  This condition is caused by blockage of one or both eustachian tubes, which drain fluid in the ears to the back of the nose.  Symptoms of this condition can include temporary hearing loss, a feeling of fullness in the ear, irritability or agitation, and balance (vestibular) problems. Sometimes, there are no symptoms.  This condition is diagnosed with an ear exam and tests, such as pneumatic otoscopy, tympanogram, and audiogram.  Treatment for this condition depends on the cause. In many cases, the fluid goes away on its own. This information is not intended to replace advice given to you  by your health care provider. Make sure you discuss any questions you have with your health care provider. Document Released: 10/12/2003 Document Revised: 06/13/2016 Document Reviewed: 06/13/2016 Elsevier Interactive Patient Education  2017 Elsevier Inc.    Preventing High Cholesterol Cholesterol is a waxy, fat-like substance that your body needs in small amounts. Your liver makes all the cholesterol that your body needs. Having high cholesterol (hypercholesterolemia) increases your risk for heart disease and stroke. Extra (excess) cholesterol comes from the food you eat, such as animal-based fat (saturated fat) from meat and some dairy products. High cholesterol can often be prevented with diet and lifestyle changes. If you already have high cholesterol, you can control it with diet and lifestyle changes, as well as medicine. What nutrition changes can be made?  Eat less saturated fat. Foods that contain saturated fat include red meat and some dairy products.  Avoid processed meats, like bacon and lunch meats.  Avoid trans fats, which are found in  margarine and some baked goods.  Avoid foods and beverages that have added sugars.  Eat more fruits, vegetables, and whole grains.  Choose healthy sources of protein, such as fish, poultry, and nuts.  Choose healthy sources of fat, such as: ? Nuts. ? Vegetable oils, especially olive oil. ? Fish that have healthy fats (omega-3 fatty acids), such as mackerel or salmon. What lifestyle changes can be made?  Lose weight if you are overweight. Losing 5-10 lb (2.3-4.5 kg) can help prevent or control high cholesterol and reduce your risk for diabetes and high blood pressure. Ask your health care provider to help you with a diet and exercise plan to safely lose weight.  Get enough exercise. Do at least 150 minutes of moderate-intensity exercise each week. ? You could do this in short exercise sessions several times a day, or you could do longer  exercise sessions a few times a week. For example, you could take a brisk 10-minute walk or bike ride, 3 times a day, for 5 days a week.  Do not smoke. If you need help quitting, ask your health care provider.  Limit your alcohol intake. If you drink alcohol, limit alcohol intake to no more than 1 drink a day for nonpregnant women and 2 drinks a day for men. One drink equals 12 oz of beer, 5 oz of wine, or 1 oz of hard liquor. Why are these changes important? If you have high cholesterol, deposits (plaques) may build up on the walls of your blood vessels. Plaques make the arteries narrower and stiffer, which can restrict or block blood flow and cause blood clots to form. This greatly increases your risk for heart attack and stroke. Making diet and lifestyle changes can reduce your risk for these life-threatening conditions. What can I do to lower my risk?  Manage your risk factors for high cholesterol. Talk with your health care provider about all of your risk factors and how to lower your risk.  Manage other conditions that you have, such as diabetes or high blood pressure (hypertension).  Have your cholesterol checked at regular intervals.  Keep all follow-up visits as told by your health care provider. This is important. How is this treated? In addition to diet and lifestyle changes, your health care provider may recommend medicines to help lower cholesterol, such as a medicine to reduce the amount of cholesterol made in your liver. You may need medicine if:  Diet and lifestyle changes do not lower your cholesterol enough.  You have high cholesterol and other risk factors for heart disease or stroke.  Take over-the-counter and prescription medicines only as told by your health care provider. Where to find more information:  American Heart Association: ThisTune.com.pt.jsp  National Heart, Lung, and Blood Institute:  FrenchToiletries.com.cy Summary  High cholesterol increases your risk for heart disease and stroke. By keeping your cholesterol level low, you can reduce your risk for these conditions.  Diet and lifestyle changes are the most important steps in preventing high cholesterol.  Work with your health care provider to manage your risk factors, and have your blood tested regularly. This information is not intended to replace advice given to you by your health care provider. Make sure you discuss any questions you have with your health care provider. Document Released: 08/06/2015 Document Revised: 03/30/2016 Document Reviewed: 03/30/2016 Elsevier Interactive Patient Education  [employer].

## 2017-12-18 NOTE — Telephone Encounter (Signed)
Patient had forgotten to mention today at his visit with Caryl Pina that when he first wakes up in the morning both middle fingers are "locked."  He has to take the opposite hand and pry them loose. He stated that Dr. Melford Aase had told him to call if it got worse and it has.   Orthopedic referral put in and patient notified.

## 2017-12-19 LAB — CBC WITH DIFFERENTIAL/PLATELET
BASOS ABS: 28 {cells}/uL (ref 0–200)
BASOS PCT: 0.5 %
Eosinophils Absolute: 78 cells/uL (ref 15–500)
Eosinophils Relative: 1.4 %
HCT: 43 % (ref 38.5–50.0)
Hemoglobin: 14.8 g/dL (ref 13.2–17.1)
Lymphs Abs: 1932 cells/uL (ref 850–3900)
MCH: 31.6 pg (ref 27.0–33.0)
MCHC: 34.4 g/dL (ref 32.0–36.0)
MCV: 91.9 fL (ref 80.0–100.0)
MONOS PCT: 7.2 %
MPV: 11.1 fL (ref 7.5–12.5)
NEUTROS ABS: 3158 {cells}/uL (ref 1500–7800)
Neutrophils Relative %: 56.4 %
PLATELETS: 285 10*3/uL (ref 140–400)
RBC: 4.68 10*6/uL (ref 4.20–5.80)
RDW: 11.6 % (ref 11.0–15.0)
TOTAL LYMPHOCYTE: 34.5 %
WBC mixed population: 403 cells/uL (ref 200–950)
WBC: 5.6 10*3/uL (ref 3.8–10.8)

## 2017-12-19 LAB — COMPLETE METABOLIC PANEL WITH GFR
AG Ratio: 1.6 (calc) (ref 1.0–2.5)
ALT: 27 U/L (ref 9–46)
AST: 22 U/L (ref 10–35)
Albumin: 4.7 g/dL (ref 3.6–5.1)
Alkaline phosphatase (APISO): 53 U/L (ref 40–115)
BILIRUBIN TOTAL: 0.8 mg/dL (ref 0.2–1.2)
BUN: 15 mg/dL (ref 7–25)
CALCIUM: 9.8 mg/dL (ref 8.6–10.3)
CHLORIDE: 105 mmol/L (ref 98–110)
CO2: 27 mmol/L (ref 20–32)
CREATININE: 0.89 mg/dL (ref 0.70–1.33)
GFR, EST AFRICAN AMERICAN: 113 mL/min/{1.73_m2} (ref >=60)
GFR, EST NON AFRICAN AMERICAN: 98 mL/min/{1.73_m2} (ref >=60)
GLUCOSE: 100 mg/dL — AB (ref 65–99)
Globulin: 2.9 g/dL (calc) (ref 1.9–3.7)
Potassium: 5.2 mmol/L (ref 3.5–5.3)
Sodium: 142 mmol/L (ref 135–146)
TOTAL PROTEIN: 7.6 g/dL (ref 6.1–8.1)

## 2017-12-19 LAB — LIPID PANEL
CHOL/HDL RATIO: 5 (calc) — AB (ref <5.0)
Cholesterol: 321 mg/dL — ABNORMAL HIGH (ref <200)
HDL: 64 mg/dL (ref >40)
LDL CHOLESTEROL (CALC): 216 mg/dL — AB
Non-HDL Cholesterol (Calc): 257 mg/dL (calc) — ABNORMAL HIGH (ref <130)
TRIGLYCERIDES: 217 mg/dL — AB (ref <150)

## 2017-12-19 LAB — TSH: TSH: 1.45 mIU/L (ref 0.40–4.50)

## 2017-12-19 LAB — VITAMIN D 25 HYDROXY (VIT D DEFICIENCY, FRACTURES): Vit D, 25-Hydroxy: 58 ng/mL (ref 30–100)

## 2018-01-02 ENCOUNTER — Other Ambulatory Visit: Payer: Self-pay | Admitting: Adult Health

## 2018-01-02 ENCOUNTER — Telehealth: Payer: Self-pay | Admitting: Internal Medicine

## 2018-01-02 MED ORDER — PREDNISONE 20 MG PO TABS: ORAL_TABLET | ORAL | 0 refills | Status: DC

## 2018-01-02 NOTE — Telephone Encounter (Signed)
Patient calls to report: Bilateral Ears are still full w/ pressure, pain " like tubes are clogged".x  19mth. Saw you recently, and [facility], would like you to make any recommendations, and/ or referral to Dr Radene Journey. If Rx send to  [facility].

## 2018-01-14 ENCOUNTER — Encounter: Payer: Self-pay | Admitting: Internal Medicine

## 2018-01-15 ENCOUNTER — Other Ambulatory Visit: Payer: Self-pay | Admitting: Internal Medicine

## 2018-01-15 MED ORDER — LIDOCAINE-PRILOCAINE 2.5-2.5 % EX CREA: TOPICAL_CREAM | CUTANEOUS | 11 refills | Status: AC

## 2018-01-19 ENCOUNTER — Other Ambulatory Visit: Payer: Self-pay

## 2018-01-19 MED ORDER — HYDROCORTISONE 2.5 % RE CREA: TOPICAL_CREAM | RECTAL | 99 refills | Status: DC

## 2018-02-02 ENCOUNTER — Other Ambulatory Visit: Payer: Self-pay | Admitting: Physician Assistant

## 2018-02-13 ENCOUNTER — Ambulatory Visit: Payer: BC Managed Care – PPO | Admitting: Internal Medicine

## 2018-02-13 ENCOUNTER — Encounter: Payer: Self-pay | Admitting: Internal Medicine

## 2018-02-13 VITALS — BP 120/86 | HR 92 | Temp 97.5°F | Resp 18 | Ht 70.5 in | Wt 209.4 lb

## 2018-02-13 DIAGNOSIS — H6531 Chronic mucoid otitis media, right ear: Secondary | ICD-10-CM

## 2018-02-13 DIAGNOSIS — J4531 Mild persistent asthma with (acute) exacerbation: Secondary | ICD-10-CM | POA: Diagnosis not present

## 2018-02-13 DIAGNOSIS — J0140 Acute pansinusitis, unspecified: Secondary | ICD-10-CM | POA: Diagnosis not present

## 2018-02-13 MED ORDER — MONTELUKAST SODIUM 10 MG PO TABS: ORAL_TABLET | ORAL | 3 refills | Status: DC

## 2018-02-13 MED ORDER — AZITHROMYCIN 250 MG PO TABS: ORAL_TABLET | ORAL | 1 refills | Status: DC

## 2018-02-13 MED ORDER — PREDNISONE 20 MG PO TABS: ORAL_TABLET | ORAL | 0 refills | Status: DC

## 2018-02-13 NOTE — Progress Notes (Signed)
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      Patient is treated for HTN (2005)  & BP has been controlled at home. Today's BP is at goal - 120/86. Patient has had no complaints of any cardiac type chest pain, palpitations, dyspnea / orthopnea / PND, dizziness, claudication, or dependent edema.     Patient is Statin Intolerant and Hyperlipidemia is not controlled with diet & meds. Patient denies myalgias or other med SE's. Last Lipids were not at goal; Lab Results  Component Value Date   CHOL 321 (H) 12/18/2017   HDL 64 12/18/2017   LDLCALC 216 (H) 12/18/2017   TRIG 217 (H) 12/18/2017   CHOLHDL 5.0 (H) 12/18/2017      Also, the patient is monitored expectantly for PreDiabetes and has had no symptoms of reactive hypoglycemia, diabetic polys, paresthesias or visual blurring.  Last A1c was Normal & at goal:  Lab Results  Component Value Date   HGBA1C 5.1 04/28/2017      Further, the patient also has history of Vitamin D Deficiency and supplements vitamin D without any suspected side-effects. Last vitamin D was not at goal (70-100):  Lab Results  Component Value Date   VD25OH 58 12/18/2017   Current Outpatient Medications on File Prior to Visit  Medication Sig  . ALPRAZolam (XANAX) 1 MG tablet TAKE 1/2 TO 1 (ONE-HALF TO ONE) TABLET BY MOUTH TWICE TO THREE TIMES DAILY AS NEEDED FOR ANXIETY  . B Complex Vitamins (B COMPLEX PO) Take by mouth.  . cholecalciferol (VITAMIN D) 1000 UNITS tablet Take 2,000 Units by mouth daily.  . hydrocortisone (PROCTOSOL HC) 2.5 % rectal cream APPLY  CREAM RECTALLY  4 TIMES DAILY  . lidocaine-prilocaine (EMLA) cream APPLY TOPICALLY AS NEEDED  . lisinopril-hydrochlorothiazide (PRINZIDE,ZESTORETIC) 20-25 MG tablet TAKE 1 TABLET BY MOUTH IN THE MORNING FOR BLOOD PRESSURE (Patient taking differently: TAKE 1/2 TABLET BY MOUTH IN THE MORNING FOR BLOOD PRESSURE)  . Multiple Vitamins-Minerals  (MULTIVITAMIN PO) Take by mouth.  . Red Yeast Rice Extract (RED YEAST RICE PO) Take by mouth.   No current facility-administered medications on file prior to visit.    Allergies  Allergen Reactions  . Lipitor [Atorvastatin]     Myalgias  . Prozac [Fluoxetine Hcl]   . Simvastatin     Myalgias   PMHx:   Past Medical History:  Diagnosis Date  . Allergy   . GERD (gastroesophageal reflux disease)   . Hyperlipidemia   . Hypertension   . IBS (irritable bowel syndrome)   . Sleep apnea 12/04/2015  . Vitamin D deficiency    Immunization History  Administered Date(s) Administered  . PPD Test 01/19/2014, 03/27/2016, 04/28/2017  . Pneumococcal-Unspecified 01/06/2012  . Td 06/10/2006  . Tdap 03/27/2016   Past Surgical History:  Procedure Laterality Date  . CHEST TUBE INSERTION Right 05-15-02   thoracostomy  . TOE SURGERY  2008   right big toe   FHx:    Reviewed / unchanged  SHx:    Reviewed / unchanged   Systems Review:  Constitutional: Denies fever, chills, wt changes, headaches, insomnia, fatigue, night sweats, change in appetite. Eyes: Denies redness, blurred vision, diplopia, discharge, itchy, watery eyes.  ENT: Denies discharge, congestion, post nasal drip, epistaxis, sore throat, earache, hearing loss, dental pain, tinnitus, vertigo, sinus pain, snoring.  CV: Denies chest pain, palpitations, irregular heartbeat, syncope, dyspnea, diaphoresis, orthopnea, PND, claudication or edema.  Respiratory: denies cough, dyspnea, DOE, pleurisy, hoarseness, laryngitis, wheezing.  Gastrointestinal: Denies dysphagia, odynophagia, heartburn, reflux, water brash, abdominal pain or cramps, nausea, vomiting, bloating, diarrhea, constipation, hematemesis, melena, hematochezia  or hemorrhoids. Genitourinary: Denies dysuria, frequency, urgency, nocturia, hesitancy, discharge, hematuria or flank pain. Musculoskeletal: Denies arthralgias, myalgias, stiffness, jt. swelling, pain, limping or  strain/sprain.  Skin: Denies pruritus, rash, hives, warts, acne, eczema or change in skin lesion(s). Neuro: No weakness, tremor, incoordination, spasms, paresthesia or pain. Psychiatric: Denies confusion, memory loss or sensory loss. Endo: Denies change in weight, skin or hair change.  Heme/Lymph: No excessive bleeding, bruising or enlarged lymph nodes.  Physical Exam  BP 120/86   Pulse 92   Temp (!) 97.5 F (36.4 C)   Resp 18   Ht 5' 10.5" (1.791 m)   Wt 209 lb 6.4 oz (95 kg)   BMI 29.62 kg/m   Appears  well nourished, well groomed  and in no distress.  Eyes: PERRLA, EOMs, conjunctiva no swelling or erythema. Sinuses: No frontal/maxillary tenderness ENT/Mouth: EAC's clear, TM's nl w/o erythema, bulging. Nares clear w/o erythema, swelling, exudates. Oropharynx clear without erythema or exudates. Oral hygiene is good. Tongue normal, non obstructing. Hearing intact.  Neck: Supple. Thyroid not palpable. Car 2+/2+ without bruits, nodes or JVD. Chest: Respirations nl with BS clear & equal w/o rales, rhonchi, wheezing or stridor.  Cor: Heart sounds normal w/ regular rate and rhythm without sig. murmurs, gallops, clicks or rubs. Peripheral pulses normal and equal  without edema.  Abdomen: Soft & bowel sounds normal. Non-tender w/o guarding, rebound, hernias, masses or organomegaly.  Lymphatics: Unremarkable.  Musculoskeletal: Full ROM all peripheral extremities, joint stability, 5/5 strength and normal gait.  Skin: Warm, dry without exposed rashes, lesions or ecchymosis apparent.  Neuro: Cranial nerves intact, reflexes equal bilaterally. Sensory-motor testing grossly intact. Tendon reflexes grossly intact.  Pysch: Alert & oriented x 3.  Insight and judgement nl & appropriate. No ideations.  Assessment and Plan:  1. Essential hypertension  - Continue medication, monitor blood pressure at home.  - Continue DASH diet.  Reminder to go to the ER if any CP,  SOB, nausea, dizziness, severe  HA, changes vision/speech.  - CBC with Differential/Platelet - COMPLETE METABOLIC PANEL WITH GFR - Magnesium - TSH  2. Hyperlipidemia, mixed  - Continue diet/meds, exercise,& lifestyle modifications.  - Continue monitor periodic cholesterol/liver & renal functions   - Lipid panel - TSH  3. Prediabetes  - Continue diet, exercise, lifestyle modifications.  - Monitor appropriate labs.  - Hemoglobin A1c - Insulin, random  4. Vitamin D deficiency  - Continue supplementation.   - VITAMIN D 25 Hydroxyl  5. Medication management  - CBC with Differential/Platelet - COMPLETE METABOLIC PANEL WITH GFR - Magnesium - Lipid panel - TSH - Hemoglobin A1c - Insulin, random - VITAMIN D 25 Hydroxyl       Discussed  regular exercise, BP monitoring, weight control to achieve/maintain BMI less than 25 and discussed med and SE's. Recommended labs to assess and monitor clinical status with further disposition pending results of labs. Over 30 minutes of exam, counseling, chart review was performed.

## 2018-05-11 ENCOUNTER — Other Ambulatory Visit: Payer: Self-pay | Admitting: Adult Health

## 2018-05-22 ENCOUNTER — Encounter: Payer: Self-pay | Admitting: Internal Medicine

## 2018-06-02 ENCOUNTER — Other Ambulatory Visit: Payer: Self-pay | Admitting: Internal Medicine

## 2018-06-16 ENCOUNTER — Encounter: Payer: Self-pay | Admitting: Adult Health Nurse Practitioner

## 2018-06-16 ENCOUNTER — Ambulatory Visit: Payer: BC Managed Care – PPO | Admitting: Adult Health Nurse Practitioner

## 2018-06-16 VITALS — BP 130/76 | HR 63 | Temp 97.3°F | Resp 16 | Ht 70.5 in | Wt 213.0 lb

## 2018-06-16 DIAGNOSIS — H9202 Otalgia, left ear: Secondary | ICD-10-CM | POA: Diagnosis not present

## 2018-06-16 DIAGNOSIS — R42 Dizziness and giddiness: Secondary | ICD-10-CM | POA: Diagnosis not present

## 2018-06-16 DIAGNOSIS — I10 Essential (primary) hypertension: Secondary | ICD-10-CM | POA: Diagnosis not present

## 2018-06-16 NOTE — Progress Notes (Signed)
Assessment and Plan: Matthew Salinas was seen today for acute visit.  Diagnoses and all orders for this visit:  Otalgia of left ear Discussed taking Zyrtec at night Ibuprofen 600mg  every 6 or 800mg  every 8 to reduce inflammation Avoid sudafed if possible related to raising B/P Discussed contacting ENT for continued work up  Spell of dizziness Discussed safety with driving. Consider putting hold on work until resolution of symptoms  Essential hypertension Continue same regiment Monitor blood pressure at home related to decongestant and ibuprofen use.  Would like to rule out acoustic neuroma.  Will send for CT temporal w/o contrast related to duration of symptoms and safety related to his occupation.   Further disposition pending results of labs. Discussed med's effects and SE's.   Over 30 minutes of exam, counseling, chart review, and critical decision making was performed.   Future Appointments  Date Time Provider [place]  06/29/2018  2:00 PM Unk Pinto, MD [facility] None    ------------------------------------------------------------------------------------------------------------------   HPI 51 y.o.male presents for left ear evaluation.  He has been to ENT for this and had an inital evaluation.  Reports that the next step was a hearing test but he did not want to do this as he knows his hearing has decreased.  He wants to find out the cause of the problem., He is taking sudafed that has helped some.  His left ear has been bother him for total of 6 months.  He feels like it is hot, itching and today it is aching.  He also reports increasing dizziness and humming that is constant. At one point has tried claritin and singular but has stopped.  He is using Nasicort nightly.  He does take Sudafed and it slightly helps.  Reports that it is worse in the morning but improves after he gets up and moving.  There is a constant humming in the right ear and contrastingly aching.  He  is having increased dizziness when the ear starts to clear.  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            Past Medical History:  Diagnosis Date  . Allergy   . GERD (gastroesophageal reflux disease)   . Hyperlipidemia   . Hypertension   . IBS (irritable bowel syndrome)   . Sleep apnea 12/04/2015  . Vitamin D deficiency      Allergies  Allergen Reactions  . Lipitor [Atorvastatin]     Myalgias  . Prozac [Fluoxetine Hcl]   . Simvastatin     Myalgias    Current Outpatient Medications on File Prior to Visit  Medication Sig  . ALPRAZolam (XANAX) 1 MG tablet TAKE 1/2 TO 1 (ONE-HALF TO ONE) TABLET BY MOUTH 2 TO 3 TIMES DAILY AS NEEDED FOR ANXIETY  . B Complex Vitamins (B COMPLEX PO) Take by mouth.  . cholecalciferol (VITAMIN D) 1000 UNITS tablet Take 2,000 Units by mouth daily.  . hydrocortisone (PROCTOSOL HC) 2.5 % rectal cream APPLY  CREAM RECTALLY  4 TIMES DAILY  . lidocaine-prilocaine (EMLA) cream APPLY TOPICALLY AS NEEDED  . lisinopril-hydrochlorothiazide (PRINZIDE,ZESTORETIC) 20-25 MG tablet TAKE 1 TABLET BY MOUTH ONCE DAILY IN THE MORNING FOR BLOOD PRESSURE  . montelukast (SINGULAIR) 10 MG tablet Take 1 tablet daily for Allergy &Asthma  . Multiple Vitamins-Minerals (MULTIVITAMIN PO) Take by mouth.  . Red Yeast Rice Extract (RED YEAST RICE PO) Take by mouth.   No current facility-administered medications on file prior to  visit.     ROS:  Review of Systems  Constitutional: Negative for chills, diaphoresis, fever, malaise/fatigue and weight loss.  HENT: Positive for ear pain, hearing loss and tinnitus. Negative for congestion, ear discharge, nosebleeds, sinus pain and sore throat.   Eyes: Negative for blurred vision, double vision, photophobia, pain, discharge and redness.  Respiratory: Negative for cough, hemoptysis, sputum production, shortness of breath, wheezing and stridor.   Cardiovascular: Negative for chest pain,  palpitations, orthopnea, claudication, leg swelling and PND.  Gastrointestinal: Negative for abdominal pain, blood in stool, constipation, diarrhea, heartburn, nausea and vomiting.  Skin: Negative for itching and rash.  Neurological: Positive for dizziness. Negative for tingling, tremors, sensory change, speech change, focal weakness, seizures, loss of consciousness, weakness and headaches.    all negative except above.   Physical Exam:  BP 130/76   Pulse 63   Temp (!) 97.3 F (36.3 C)   Resp 16   Ht 5' 10.5" (1.791 m)   Wt 213 lb (96.6 kg)   SpO2 96%   BMI 30.13 kg/m   General Appearance: Well nourished, in no apparent distress. Eyes: PERRLA, EOMs, conjunctiva no swelling or erythema Sinuses: No Frontal/maxillary tenderness ENT/Mouth: Ext aud canals clear, TMs without erythema, bulging. Serous noted behind left TM. No erythema, swelling, or exudate on post pharynx.  Tonsils not swollen or erythematous. Hearing normal.  Neck: Supple, thyroid normal.  Respiratory: Respiratory effort normal, BS equal bilaterally without rales, rhonchi, wheezing or stridor.  Cardio: RRR with no MRGs. Brisk peripheral pulses without edema.  Abdomen: Soft, + BS.  Non tender, no guarding, rebound, hernias, masses. Lymphatics: Non tender without lymphadenopathy.  Musculoskeletal: Full ROM, 5/5 strength, normal gait. Positive Romberg's. Skin: Warm, dry without rashes, lesions, ecchymosis.  Neuro: Cranial nerves intact. Normal muscle tone, no cerebellar symptoms. Sensation intact.  Psych: Awake and oriented X 3, normal affect, Insight and Judgment appropriate.     Garnet Sierras, NP 1:08 PM [facility]

## 2018-06-16 NOTE — Patient Instructions (Addendum)
Take Zyrtec every night to help dry up any fluid in your ears.  Ibuprofen 600mg  every 6 hours or 800mg  every 8hours.   Contact ENT to see if he will schedule a scan.   We will do a CT scan related to duration of symptoms and contact you.  If you do not hear from [place] in one week, contact the office.

## 2018-06-17 ENCOUNTER — Other Ambulatory Visit: Payer: Self-pay | Admitting: Physician Assistant

## 2018-06-17 ENCOUNTER — Other Ambulatory Visit: Payer: Self-pay | Admitting: Adult Health Nurse Practitioner

## 2018-06-17 ENCOUNTER — Telehealth: Payer: Self-pay | Admitting: Internal Medicine

## 2018-06-17 DIAGNOSIS — R42 Dizziness and giddiness: Secondary | ICD-10-CM

## 2018-06-17 DIAGNOSIS — H9202 Otalgia, left ear: Secondary | ICD-10-CM

## 2018-06-17 NOTE — Telephone Encounter (Signed)
Per Kyra's request called [facility] to confirm CT Temporal w/o cm for dx: ear pain, ringing, dizzines.  To r/o Acustic Neuroma.   This is the correct order. If you decide to order CT Temporal w/o cm;  [id number] is the appropriate code.

## 2018-06-24 ENCOUNTER — Ambulatory Visit
Admission: RE | Admit: 2018-06-24 | Discharge: 2018-06-24 | Disposition: A | Discharge status: Home / Self Care | Payer: BC Managed Care – PPO | Source: Ambulatory Visit | Attending: Adult Health Nurse Practitioner | Admitting: Adult Health Nurse Practitioner

## 2018-06-24 DIAGNOSIS — H9202 Otalgia, left ear: Secondary | ICD-10-CM

## 2018-06-24 DIAGNOSIS — R42 Dizziness and giddiness: Secondary | ICD-10-CM

## 2018-06-28 ENCOUNTER — Encounter: Payer: Self-pay | Admitting: Internal Medicine

## 2018-06-28 NOTE — Progress Notes (Signed)
[facility]   Unk Pinto, M.D.     [name], P.A.-C [name], [place]                [address] [place] [address] Telephone [phone] Telefax [phone] Annual  Screening/Preventative Visit  & Comprehensive Evaluation & Examination     This very nice 52 y.o. [name] presents for a Screening /Preventative Visit & comprehensive evaluation and management of multiple medical co-morbidities.  Patient has been followed for HTN, HLD, Prediabetes and Vitamin D Deficiency.     Patient had sleep eval by Dr Brett Fairy and study showed only snoring w/o apnea and he declined f/u or referral for an oral appliance.      HTN predates since 2005. Patient's BP has been controlled at home.  Today's BP is at goal -  124/78. Patient denies any cardiac symptoms as chest pain, palpitations, shortness of breath, dizziness or ankle swelling.     Patient's hyperlipidemia is not controlled with diet and he reports Statin Intolerance and self d/c'd his Zetia. Patient denies myalgias or other medication SE's. Last lipids were not at goal: Lab Results  Component Value Date   CHOL 321 (H) 12/18/2017   HDL 64 12/18/2017   LDLCALC 216 (H) 12/18/2017   TRIG 217 (H) 12/18/2017   CHOLHDL 5.0 (H) 12/18/2017      Patient has hx/o abnormal glucose and patient denies reactive hypoglycemic symptoms, visual blurring, diabetic polys or paresthesias. Last A1c was Normal & at goal: Lab Results  Component Value Date   HGBA1C 5.1 04/28/2017       Finally, patient has history of Vitamin D Deficiency ("34" / 2016) and last vitamin D was at goal: Lab Results  Component Value Date   VD25OH 58 12/18/2017   Current Outpatient Medications on File Prior to Visit  Medication Sig  . ALPRAZolam (XANAX) 1 MG tablet TAKE 1/2 TO 1 (ONE-HALF TO ONE) TABLET BY MOUTH 2 TO 3 TIMES DAILY AS NEEDED FOR ANXIETY  . B Complex  Vitamins (B COMPLEX PO) Take by mouth.  . cholecalciferol (VITAMIN D) 1000 UNITS tablet Take 2,000 Units by mouth daily.  . hydrocortisone (PROCTOSOL HC) 2.5 % rectal cream APPLY  CREAM RECTALLY  4 TIMES DAILY  . lidocaine-prilocaine (EMLA) cream APPLY TOPICALLY AS NEEDED  . lisinopril-hydrochlorothiazide (PRINZIDE,ZESTORETIC) 20-25 MG tablet TAKE 1 TABLET BY MOUTH ONCE DAILY IN THE MORNING FOR BLOOD PRESSURE  . Multiple Vitamins-Minerals (MULTIVITAMIN PO) Take by mouth.  . Red Yeast Rice Extract (RED YEAST RICE PO) Take by mouth.  . montelukast (SINGULAIR) 10 MG tablet Take 1 tablet daily for Allergy &Asthma (Patient not taking: Reported on 06/29/2018)   No current facility-administered medications on file prior to visit.    Allergies  Allergen Reactions  . Lipitor [Atorvastatin]     Myalgias  . Prozac [Fluoxetine Hcl]   . Simvastatin     Myalgias   Past Medical History:  Diagnosis Date  . Allergy   . GERD (gastroesophageal reflux disease)   . Hyperlipidemia   . Hypertension   . IBS (irritable bowel syndrome)   . Sleep apnea 12/04/2015  . Vitamin D deficiency    Health Maintenance  Topic Date Due  . INFLUENZA VACCINE  06/30/2019 (Originally 03/05/2018)  . COLONOSCOPY  06/27/2025  . TETANUS/TDAP  03/27/2026  . Hepatitis C Screening  Completed  . HIV Screening  Completed   Immunization History  Administered Date(s) Administered  . PPD Test 01/19/2014, 03/27/2016, 04/28/2017, 06/29/2018  . Pneumococcal-Unspecified 01/06/2012  . Td 06/10/2006  . Tdap 03/27/2016   Last Colon - 06/28/2015 - Dr Alexandria Lodge - recc 10 yr f/u due Nov 2026  Past Surgical History:  Procedure Laterality Date  . CHEST TUBE INSERTION Right 05-15-02   thoracostomy  . TOE SURGERY  2008   right big toe   Family History  Problem Relation Age of Onset  . Cancer Mother        lung  . Hypertension Father   . Hyperlipidemia Father   . Dementia Brother   . Colon cancer Maternal Grandmother    Social  History   Socioeconomic History  . Marital status: Divorced  . Number of children: 1 daughter  . Years of education: Not on file  . Highest education level: Not on file  Occupational History  . Company secretary for [employer]  Tobacco Use  . Smoking status: Former Smoker    Last attempt to quit: 08/05/2001    Years since quitting: 16.9  . Smokeless tobacco: Never Used  . Tobacco comment: Smoked 5-10 cigarettes per day since age 69  Substance and Sexual Activity  . Alcohol use: Yes    Alcohol/week: 10.0 standard drinks    Types: 10 Standard drinks or equivalent per week  . Drug use: No  . Sexual activity: Not on file    ROS Constitutional: Denies fever, chills, weight loss/gain, headaches, insomnia,  night sweats or change in appetite. Does c/o fatigue. Eyes: Denies redness, blurred vision, diplopia, discharge, itchy or watery eyes.  ENT: Denies discharge, congestion, post nasal drip, epistaxis, sore throat, earache, hearing loss, dental pain, Tinnitus, Vertigo, Sinus pain or snoring.  Cardio: Denies chest pain, palpitations, irregular heartbeat, syncope, dyspnea, diaphoresis, orthopnea, PND, claudication or edema Respiratory: denies cough, dyspnea, DOE, pleurisy, hoarseness, laryngitis or wheezing.  Gastrointestinal: Denies dysphagia, heartburn, reflux, water brash, pain, cramps, nausea, vomiting, bloating, diarrhea, constipation, hematemesis, melena, hematochezia, jaundice or hemorrhoids Genitourinary: Denies dysuria, frequency, urgency, nocturia, hesitancy, discharge, hematuria or flank pain Musculoskeletal: Denies arthralgia, myalgia, stiffness, Jt. Swelling, pain, limp or strain/sprain. Denies Falls. Skin: Denies puritis, rash, hives, warts, acne, eczema or change in skin lesion Neuro: No weakness, tremor, incoordination, spasms, paresthesia or pain Psychiatric: Denies confusion, memory loss or sensory loss. Denies Depression. Endocrine: Denies change in weight, skin, hair  change, nocturia, and paresthesia, diabetic polys, visual blurring or hyper / hypo glycemic episodes.  Heme/Lymph: No excessive bleeding, bruising or enlarged lymph nodes.  Physical Exam  BP 124/78   Pulse 88   Temp (!) 97.3 F (36.3 C)   Resp 16   Ht 5' 10.5" (1.791 m)   Wt 214 lb 6.4 oz (97.3 kg)   BMI 30.33 kg/m   General Appearance: Well nourished and well groomed and in no apparent distress.  Eyes: PERRLA, EOMs, conjunctiva no swelling or erythema, normal fundi and vessels. Sinuses: No frontal/maxillary tenderness ENT/Mouth: EACs patent / TMs  nl. Nares clear without erythema, swelling, mucoid exudates. Oral hygiene is good. No erythema, swelling, or exudate. Tongue normal, non-obstructing. Tonsils not swollen or erythematous. Hearing normal.  Neck: Supple, thyroid not palpable. No bruits, nodes or JVD. Respiratory: Respiratory effort normal.  BS equal and clear bilateral without rales, rhonci, wheezing or stridor. Cardio: Heart sounds are normal with regular rate and rhythm and no murmurs, rubs or gallops. Peripheral pulses are normal  and equal bilaterally without edema. No aortic or femoral bruits. Chest: symmetric with normal excursions and percussion.  Abdomen: Soft, with Nl bowel sounds. Nontender, no guarding, rebound, hernias, masses, or organomegaly.  Lymphatics: Non tender without lymphadenopathy.  Genitourinary: No hernias.Testes nl. DRE - prostate nl for age - smooth & firm w/o nodules. Musculoskeletal: Full ROM all peripheral extremities, joint stability, 5/5 strength, and normal gait. Skin: Warm and dry without rashes, lesions, cyanosis, clubbing or  ecchymosis.  Neuro: Cranial nerves intact, reflexes equal bilaterally. Normal muscle tone, no cerebellar symptoms. Sensation intact.  Pysch: Alert and oriented X 3 with normal affect, insight and judgment appropriate.   Assessment and Plan  1. Annual Preventative/Screening Exam   2. Essential hypertension  - EKG  12-Lead - [place], RETROPERITNL ABD,  LTD - Urinalysis, Routine w reflex microscopic - Microalbumin / creatinine urine ratio - CBC with Differential/Platelet - COMPLETE METABOLIC PANEL WITH GFR - Magnesium - TSH  3. Hyperlipidemia, mixed  - EKG 12-Lead - [place], RETROPERITNL ABD,  LTD - Lipid panel - TSH  4. Vitamin D deficiency  - VITAMIN D 25 Hydroxyl  5. Screening for colorectal cancer  - POC Hemoccult Bld/Stl   6. Prostate cancer screening  - PSA  7. Screening for ischemic heart disease  - EKG 12-Lead  8. FHx: heart disease  - EKG 12-Lead - [place], RETROPERITNL ABD,  LTD  9. Former smoker  - EKG 12-Lead - [place], RETROPERITNL ABD,  LTD  10. Screening for AAA (aortic abdominal aneurysm)  - [place], RETROPERITNL ABD,  LTD  11. Screening examination for pulmonary tuberculosis  - PPD  12. Fatigue, unspecified type  - Iron,Total/Total Iron Binding Cap - Vitamin B12 - Testosterone - CBC with Differential/Platelet  13. Medication management  - Urinalysis, Routine w reflex microscopic - Microalbumin / creatinine urine ratio - CBC with Differential/Platelet - COMPLETE METABOLIC PANEL WITH GFR - Magnesium - Lipid panel - TSH - Hemoglobin A1c - Insulin, random - VITAMIN D 25 Hydroxyl        Patient was counseled in prudent diet, weight control to achieve/maintain BMI less than 25, BP monitoring, regular exercise and medications as discussed.  Discussed med effects and SE's. Routine screening labs and tests as requested with regular follow-up as recommended. Over 40 minutes of exam, counseling, chart review and high complex critical decision making was performed

## 2018-06-28 NOTE — Patient Instructions (Signed)

## 2018-06-29 ENCOUNTER — Encounter: Payer: Self-pay | Admitting: Internal Medicine

## 2018-06-29 ENCOUNTER — Ambulatory Visit: Payer: BC Managed Care – PPO | Admitting: Internal Medicine

## 2018-06-29 VITALS — BP 124/78 | HR 88 | Temp 97.3°F | Resp 16 | Ht 70.5 in | Wt 214.4 lb

## 2018-06-29 DIAGNOSIS — R350 Frequency of micturition: Secondary | ICD-10-CM

## 2018-06-29 DIAGNOSIS — Z1212 Encounter for screening for malignant neoplasm of rectum: Secondary | ICD-10-CM

## 2018-06-29 DIAGNOSIS — Z125 Encounter for screening for malignant neoplasm of prostate: Secondary | ICD-10-CM

## 2018-06-29 DIAGNOSIS — E559 Vitamin D deficiency, unspecified: Secondary | ICD-10-CM

## 2018-06-29 DIAGNOSIS — Z130 Encounter for screening for diseases of the blood and blood-forming organs and certain disorders involving the immune mechanism: Secondary | ICD-10-CM

## 2018-06-29 DIAGNOSIS — Z79899 Other long term (current) drug therapy: Secondary | ICD-10-CM

## 2018-06-29 DIAGNOSIS — N401 Enlarged prostate with lower urinary tract symptoms: Secondary | ICD-10-CM | POA: Diagnosis not present

## 2018-06-29 DIAGNOSIS — Z1329 Encounter for screening for other suspected endocrine disorder: Secondary | ICD-10-CM

## 2018-06-29 DIAGNOSIS — Z13220 Encounter for screening for lipoid disorders: Secondary | ICD-10-CM | POA: Diagnosis not present

## 2018-06-29 DIAGNOSIS — Z131 Encounter for screening for diabetes mellitus: Secondary | ICD-10-CM

## 2018-06-29 DIAGNOSIS — Z1389 Encounter for screening for other disorder: Secondary | ICD-10-CM | POA: Diagnosis not present

## 2018-06-29 DIAGNOSIS — I10 Essential (primary) hypertension: Secondary | ICD-10-CM

## 2018-06-29 DIAGNOSIS — Z87891 Personal history of nicotine dependence: Secondary | ICD-10-CM

## 2018-06-29 DIAGNOSIS — Z0001 Encounter for general adult medical examination with abnormal findings: Secondary | ICD-10-CM

## 2018-06-29 DIAGNOSIS — Z0000 Encounter for general adult medical examination without abnormal findings: Secondary | ICD-10-CM

## 2018-06-29 DIAGNOSIS — Z111 Encounter for screening for respiratory tuberculosis: Secondary | ICD-10-CM | POA: Diagnosis not present

## 2018-06-29 DIAGNOSIS — Z136 Encounter for screening for cardiovascular disorders: Secondary | ICD-10-CM | POA: Diagnosis not present

## 2018-06-29 DIAGNOSIS — E782 Mixed hyperlipidemia: Secondary | ICD-10-CM

## 2018-06-29 DIAGNOSIS — Z8249 Family history of ischemic heart disease and other diseases of the circulatory system: Secondary | ICD-10-CM

## 2018-06-29 DIAGNOSIS — Z1211 Encounter for screening for malignant neoplasm of colon: Secondary | ICD-10-CM

## 2018-06-29 DIAGNOSIS — R5383 Other fatigue: Secondary | ICD-10-CM

## 2018-06-30 ENCOUNTER — Other Ambulatory Visit: Payer: Self-pay | Admitting: Internal Medicine

## 2018-06-30 DIAGNOSIS — E782 Mixed hyperlipidemia: Secondary | ICD-10-CM

## 2018-06-30 LAB — COMPLETE METABOLIC PANEL WITH GFR
AG Ratio: 1.8 (calc) (ref 1.0–2.5)
ALT: 21 U/L (ref 9–46)
AST: 22 U/L (ref 10–35)
Albumin: 4.6 g/dL (ref 3.6–5.1)
Alkaline phosphatase (APISO): 57 U/L (ref 40–115)
BUN: 12 mg/dL (ref 7–25)
CALCIUM: 9.7 mg/dL (ref 8.6–10.3)
CO2: 28 mmol/L (ref 20–32)
CREATININE: 0.88 mg/dL (ref 0.70–1.33)
Chloride: 99 mmol/L (ref 98–110)
GFR, EST NON AFRICAN AMERICAN: 97 mL/min/{1.73_m2} (ref >=60)
GFR, Est African American: 113 mL/min/{1.73_m2} (ref >=60)
GLOBULIN: 2.6 g/dL (ref 1.9–3.7)
Glucose, Bld: 86 mg/dL (ref 65–99)
Potassium: 4.1 mmol/L (ref 3.5–5.3)
Sodium: 137 mmol/L (ref 135–146)
TOTAL PROTEIN: 7.2 g/dL (ref 6.1–8.1)
Total Bilirubin: 0.6 mg/dL (ref 0.2–1.2)

## 2018-06-30 LAB — CBC WITH DIFFERENTIAL/PLATELET
Basophils Absolute: 47 cells/uL (ref 0–200)
Basophils Relative: 0.6 %
EOS PCT: 1.2 %
Eosinophils Absolute: 94 cells/uL (ref 15–500)
HCT: 41.7 % (ref 38.5–50.0)
Hemoglobin: 14.2 g/dL (ref 13.2–17.1)
Lymphs Abs: 2293 cells/uL (ref 850–3900)
MCH: 31.6 pg (ref 27.0–33.0)
MCHC: 34.1 g/dL (ref 32.0–36.0)
MCV: 92.9 fL (ref 80.0–100.0)
MONOS PCT: 6.8 %
MPV: 10.8 fL (ref 7.5–12.5)
Neutro Abs: 4836 cells/uL (ref 1500–7800)
Neutrophils Relative %: 62 %
PLATELETS: 321 10*3/uL (ref 140–400)
RBC: 4.49 10*6/uL (ref 4.20–5.80)
RDW: 11.7 % (ref 11.0–15.0)
TOTAL LYMPHOCYTE: 29.4 %
WBC mixed population: 530 cells/uL (ref 200–950)
WBC: 7.8 10*3/uL (ref 3.8–10.8)

## 2018-06-30 LAB — LIPID PANEL
CHOL/HDL RATIO: 4.5 (calc) (ref <5.0)
CHOLESTEROL: 276 mg/dL — AB (ref <200)
HDL: 61 mg/dL (ref >40)
LDL CHOLESTEROL (CALC): 168 mg/dL — AB
Non-HDL Cholesterol (Calc): 215 mg/dL (calc) — ABNORMAL HIGH (ref <130)
Triglycerides: 288 mg/dL — ABNORMAL HIGH (ref <150)

## 2018-06-30 LAB — HEMOGLOBIN A1C
Hgb A1c MFr Bld: 5.2 % of total Hgb (ref <5.7)
Mean Plasma Glucose: 103 (calc)
eAG (mmol/L): 5.7 (calc)

## 2018-06-30 LAB — URINALYSIS, ROUTINE W REFLEX MICROSCOPIC
BILIRUBIN URINE: NEGATIVE
Glucose, UA: NEGATIVE
Hgb urine dipstick: NEGATIVE
Ketones, ur: NEGATIVE
Leukocytes, UA: NEGATIVE
Nitrite: NEGATIVE
PROTEIN: NEGATIVE
Specific Gravity, Urine: 1.006 (ref 1.001–1.03)
pH: 5.5 (ref 5.0–8.0)

## 2018-06-30 LAB — PSA: PSA: 0.6 ng/mL (ref <=4.0)

## 2018-06-30 LAB — MICROALBUMIN / CREATININE URINE RATIO
CREATININE, URINE: 29 mg/dL (ref 20–320)
MICROALB UR: 0.2 mg/dL
Microalb Creat Ratio: 7 mcg/mg creat (ref <30)

## 2018-06-30 LAB — IRON, TOTAL/TOTAL IRON BINDING CAP
%SAT: 43 % (calc) (ref 20–48)
IRON: 157 ug/dL (ref 50–180)
TIBC: 364 mcg/dL (calc) (ref 250–425)

## 2018-06-30 LAB — TSH: TSH: 1.65 mIU/L (ref 0.40–4.50)

## 2018-06-30 LAB — VITAMIN B12: Vitamin B-12: 548 pg/mL (ref 200–1100)

## 2018-06-30 LAB — VITAMIN D 25 HYDROXY (VIT D DEFICIENCY, FRACTURES): Vit D, 25-Hydroxy: 71 ng/mL (ref 30–100)

## 2018-06-30 LAB — TESTOSTERONE: TESTOSTERONE: 367 ng/dL (ref 250–827)

## 2018-06-30 LAB — MAGNESIUM: Magnesium: 1.8 mg/dL (ref 1.5–2.5)

## 2018-06-30 LAB — INSULIN, RANDOM: Insulin: 1.6 u[IU]/mL — ABNORMAL LOW (ref 2.0–19.6)

## 2018-06-30 MED ORDER — EZETIMIBE 10 MG PO TABS: 10.0000 mg | ORAL_TABLET | Freq: Every day | ORAL | 1 refills | Status: DC

## 2018-06-30 MED ORDER — ROSUVASTATIN CALCIUM 40 MG PO TABS: ORAL_TABLET | ORAL | 1 refills | Status: DC

## 2018-07-01 LAB — TB SKIN TEST
Induration: 0 mm
TB Skin Test: NEGATIVE

## 2018-08-20 ENCOUNTER — Other Ambulatory Visit: Payer: Self-pay

## 2018-08-20 DIAGNOSIS — Z1212 Encounter for screening for malignant neoplasm of rectum: Principal | ICD-10-CM

## 2018-08-20 DIAGNOSIS — Z1211 Encounter for screening for malignant neoplasm of colon: Secondary | ICD-10-CM

## 2018-08-20 LAB — POC HEMOCCULT BLD/STL (HOME/3-CARD/SCREEN)
FECAL OCCULT BLD: NEGATIVE
FECAL OCCULT BLD: NEGATIVE
FECAL OCCULT BLD: NEGATIVE

## 2018-08-21 DIAGNOSIS — Z1211 Encounter for screening for malignant neoplasm of colon: Secondary | ICD-10-CM | POA: Diagnosis not present

## 2018-09-06 ENCOUNTER — Other Ambulatory Visit: Payer: Self-pay | Admitting: Internal Medicine

## 2018-10-05 NOTE — Progress Notes (Deleted)
FOLLOW UP  Assessment and Plan:   Hypertension Well controlled with current medications  Monitor blood pressure at home; patient to call if consistently greater than 130/80 Continue DASH diet.   Reminder to go to the ER if any CP, SOB, nausea, dizziness, severe HA, changes vision/speech, left arm numbness and tingling and jaw pain.  Cholesterol LDL and total cholesterol remain poorly controlled; statin intolerant, has not been taking prescribed zetia or gemfibrozil - has been taking citrucel, would prefer to work on diet. Refuses medications due to concerns with SE.  Continue low cholesterol diet and exercise.  Check lipid panel.   Other abnormal glucose Recent A1Cs well controlled Continue diet and exercise.  Perform daily foot/skin check, notify office of any concerning changes.  Defer A1C; check BMP  Obesity with co morbidities Long discussion about weight loss, diet, and exercise Recommended diet heavy in fruits and veggies and low in animal meats, cheeses, and dairy products, appropriate calorie intake Discussed ideal weight for height and initial weight goal (200 lb) Will follow up in 3 months  Vitamin D Def At goal at last visit; continue supplementation to maintain goal of 70-100 Defer Vit D level  Continue diet and meds as discussed. Further disposition pending results of labs. Discussed med's effects and SE's.   Over 30 minutes of exam, counseling, chart review, and critical decision making was performed.   Future Appointments  Date Time Provider [place]  10/07/2018 10:30 AM Vicie Mutters, PA-C [facility] None  01/06/2019  2:30 PM [name], NP [facility] None  07/22/2019  2:00 PM Unk Pinto, MD [facility] None    ----------------------------------------------------------------------------------------------------------------------  HPI 53 y.o. male  presents for 3 month follow up on hypertension, cholesterol, diabetes, obesity and vitamin D  deficiency.   he has a diagnosis of anxiety and is currently on xanax PRN, reports symptoms are well controlled on current regimen. He takes at bedtime 1-2 times a week at night.   BMI is There is no height or weight on file to calculate BMI., he has been working on diet and exercise. Wt Readings from Last 3 Encounters:  06/29/18 214 lb 6.4 oz (97.3 kg)  06/16/18 213 lb (96.6 kg)  02/13/18 209 lb 6.4 oz (95 kg)   His blood pressure has been controlled at home, today their BP is    He does workout. He denies chest pain, shortness of breath, dizziness.   He is prescribed medication but has not been taking - denies myalgias. He is reportedly intolerant of statins, refuses zetia and gemfibrozil due to potential SE.  His cholesterol is not at goal. The cholesterol last visit was:   Lab Results  Component Value Date   CHOL 276 (H) 06/29/2018   HDL 61 06/29/2018   LDLCALC 168 (H) 06/29/2018   TRIG 288 (H) 06/29/2018   CHOLHDL 4.5 06/29/2018    He has been working on diet and exercise for glucose management, and denies increased appetite, nausea, paresthesia of the feet, polydipsia, polyuria, visual disturbances and vomiting. Last A1C in the office was:  Lab Results  Component Value Date   HGBA1C 5.2 06/29/2018   Patient is on Vitamin D supplement; he did increase his dose after last visit:    Lab Results  Component Value Date   VD25OH 71 06/29/2018        Current Medications:  Current Outpatient Medications on File Prior to Visit  Medication Sig  . ALPRAZolam (XANAX) 1 MG tablet TAKE 1/2 TO 1 (ONE-HALF TO  ONE) TABLET BY MOUTH 2 TO 3 TIMES DAILY AS NEEDED FOR ANXIETY  . B Complex Vitamins (B COMPLEX PO) Take by mouth.  . cholecalciferol (VITAMIN D) 1000 UNITS tablet Take 2,000 Units by mouth daily.  Marland Kitchen ezetimibe (ZETIA) 10 MG tablet Take 1 tablet (10 mg total) by mouth daily.  . hydrocortisone (PROCTOSOL HC) 2.5 % rectal cream APPLY  CREAM RECTALLY  4 TIMES DAILY  .  lidocaine-prilocaine (EMLA) cream APPLY TOPICALLY AS NEEDED  . lisinopril-hydrochlorothiazide (PRINZIDE,ZESTORETIC) 20-25 MG tablet TAKE 1 TABLET BY MOUTH ONCE DAILY IN THE MORNING FOR BLOOD PRESSURE  . montelukast (SINGULAIR) 10 MG tablet Take 1 tablet daily for Allergy &Asthma (Patient not taking: Reported on 06/29/2018)  . Multiple Vitamins-Minerals (MULTIVITAMIN PO) Take by mouth.  . rosuvastatin (CRESTOR) 40 MG tablet Take 1/2 to 1 tablet daily or as directed for Cholesterol   No current facility-administered medications on file prior to visit.      Allergies:  Allergies  Allergen Reactions  . Lipitor [Atorvastatin]     Myalgias  . Prozac [Fluoxetine Hcl]   . Simvastatin     Myalgias     Medical History:  Past Medical History:  Diagnosis Date  . Allergy   . GERD (gastroesophageal reflux disease)   . Hyperlipidemia   . Hypertension   . IBS (irritable bowel syndrome)   . Sleep apnea 12/04/2015  . Vitamin D deficiency    Family history- Reviewed and unchanged Social history- Reviewed and unchanged   Review of Systems:  Review of Systems  Constitutional: Negative for malaise/fatigue and weight loss.  HENT: Negative for hearing loss and tinnitus.   Eyes: Negative for blurred vision and double vision.  Respiratory: Negative for cough, shortness of breath and wheezing.   Cardiovascular: Negative for chest pain, palpitations, orthopnea, claudication and leg swelling.  Gastrointestinal: Negative for abdominal pain, blood in stool, constipation, diarrhea, heartburn, melena, nausea and vomiting.  Genitourinary: Negative.   Musculoskeletal: Negative for joint pain and myalgias.  Skin: Negative for rash.  Neurological: Negative for dizziness, tingling, sensory change, weakness and headaches.  Endo/Heme/Allergies: Negative for polydipsia.  Psychiatric/Behavioral: Negative.   All other systems reviewed and are negative.     Physical Exam: There were no vitals taken for this  visit. Wt Readings from Last 3 Encounters:  06/29/18 214 lb 6.4 oz (97.3 kg)  06/16/18 213 lb (96.6 kg)  02/13/18 209 lb 6.4 oz (95 kg)   General Appearance: Well nourished, in no apparent distress. Eyes: PERRLA, EOMs, conjunctiva no swelling or erythema Sinuses: No Frontal/maxillary tenderness ENT/Mouth: Ext aud canals clear, TMs without erythema, bulging. No erythema, swelling, or exudate on post pharynx.  Tonsils not swollen or erythematous -large at baseline bilaterally, 3+. Hearing normal.  Neck: Supple, thyroid normal.  Respiratory: Respiratory effort normal, BS equal bilaterally without rales, rhonchi, wheezing or stridor.  Cardio: RRR with no MRGs. Brisk peripheral pulses without edema.  Abdomen: Soft, + BS.  Non tender, no guarding, rebound, hernias, masses. Lymphatics: Non tender without lymphadenopathy.  Musculoskeletal: Full ROM, 5/5 strength, Normal gait Skin: Warm, dry without rashes, lesions, ecchymosis.  Neuro: Cranial nerves intact. No cerebellar symptoms.  Psych: Awake and oriented X 3, normal affect, Insight and Judgment appropriate.    Vicie Mutters, PA-C 12:42 PM [facility]

## 2018-10-07 ENCOUNTER — Ambulatory Visit: Payer: Self-pay | Admitting: Physician Assistant

## 2018-12-04 ENCOUNTER — Other Ambulatory Visit: Payer: Self-pay | Admitting: Physician Assistant

## 2019-01-04 NOTE — Progress Notes (Deleted)
FOLLOW UP  Assessment and Plan:   Hypertension Well controlled with current medications  Monitor blood pressure at home; patient to call if consistently greater than 130/80 Continue DASH diet.   Reminder to go to the ER if any CP, SOB, nausea, dizziness, severe HA, changes vision/speech, left arm numbness and tingling and jaw pain.  Cholesterol LDL and total cholesterol remain poorly controlled; rosuvastatin ***, ? Hx of statin intolerance ?  Continue low cholesterol diet and exercise.  Check lipid panel.   Other abnormal glucose Recent A1Cs well controlled  Continue diet and exercise.  Perform daily foot/skin check, notify office of any concerning changes.  Defer A1C; check CMP  Obesity with co morbidities Long discussion about weight loss, diet, and exercise Recommended diet heavy in fruits and veggies and low in animal meats, cheeses, and dairy products, appropriate calorie intake Discussed ideal weight for height and  weight goal (200 lb) Will follow up in 3 months  Vitamin D Def Below goal at last visit; he did change dose; continue supplementation to maintain goal of 70-100 Check Vit D level  Anxiety Well managed by current regimen; continue medications Stress management techniques discussed, increase water, good sleep hygiene discussed, increase exercise, and increase veggies.   Continue diet and meds as discussed. Further disposition pending results of labs. Discussed med's effects and SE's.   Over 30 minutes of exam, counseling, chart review, and critical decision making was performed.   Future Appointments  Date Time Provider [place]  01/06/2019  2:30 PM [name], NP [facility] None  07/22/2019  2:00 PM Unk Pinto, MD [facility] None    ----------------------------------------------------------------------------------------------------------------------  HPI 53 y.o. male  presents for 3 month follow up on hypertension, cholesterol,  glucose management, obesity and vitamin D deficiency.   he has a diagnosis of anxiety and is currently on xanax PRN, reports symptoms are well controlled on current regimen. He takes at bedtime 1-2 times a week at night.   BMI is There is no height or weight on file to calculate BMI., he has been working on diet and exercise. Wt Readings from Last 3 Encounters:  06/29/18 214 lb 6.4 oz (97.3 kg)  06/16/18 213 lb (96.6 kg)  02/13/18 209 lb 6.4 oz (95 kg)   His blood pressure has been controlled at home, today their BP is    He does workout. He denies chest pain, shortness of breath, dizziness.   He is *** on cholesterol medication (newly? On rosuvastatin ***) and denies myalgias. His cholesterol is not at goal. The cholesterol last visit was:   Lab Results  Component Value Date   CHOL 276 (H) 06/29/2018   HDL 61 06/29/2018   LDLCALC 168 (H) 06/29/2018   TRIG 288 (H) 06/29/2018   CHOLHDL 4.5 06/29/2018    He has been working on diet and exercise for glucose management, and denies foot ulcerations, increased appetite, nausea, paresthesia of the feet, polydipsia, polyuria, visual disturbances, vomiting and weight loss. Last A1C in the office was:  Lab Results  Component Value Date   HGBA1C 5.2 06/29/2018   Patient is on Vitamin D supplement.   Lab Results  Component Value Date   VD25OH 84 06/29/2018        Current Medications:  Current Outpatient Medications on File Prior to Visit  Medication Sig  . ALPRAZolam (XANAX) 1 MG tablet TAKE 1/2 (ONE-HALF) TO 1 (ONE)  TABLET BY MOUTH 2-3 TIMES PER DAY AS NEEDED ANXIETY  . B Complex Vitamins (B  COMPLEX PO) Take by mouth.  . cholecalciferol (VITAMIN D) 1000 UNITS tablet Take 2,000 Units by mouth daily.  Marland Kitchen ezetimibe (ZETIA) 10 MG tablet Take 1 tablet (10 mg total) by mouth daily.  . hydrocortisone (PROCTOSOL HC) 2.5 % rectal cream APPLY  CREAM RECTALLY  4 TIMES DAILY  . lidocaine-prilocaine (EMLA) cream APPLY TOPICALLY AS NEEDED  .  lisinopril-hydrochlorothiazide (PRINZIDE,ZESTORETIC) 20-25 MG tablet TAKE 1 TABLET BY MOUTH ONCE DAILY IN THE MORNING FOR BLOOD PRESSURE  . montelukast (SINGULAIR) 10 MG tablet Take 1 tablet daily for Allergy &Asthma (Patient not taking: Reported on 06/29/2018)  . Multiple Vitamins-Minerals (MULTIVITAMIN PO) Take by mouth.  . rosuvastatin (CRESTOR) 40 MG tablet Take 1/2 to 1 tablet daily or as directed for Cholesterol   No current facility-administered medications on file prior to visit.      Allergies:  Allergies  Allergen Reactions  . Lipitor [Atorvastatin]     Myalgias  . Prozac [Fluoxetine Hcl]   . Simvastatin     Myalgias     Medical History:  Past Medical History:  Diagnosis Date  . Allergy   . GERD (gastroesophageal reflux disease)   . Hyperlipidemia   . Hypertension   . IBS (irritable bowel syndrome)   . Sleep apnea 12/04/2015  . Vitamin D deficiency    Family history- Reviewed and unchanged Social history- Reviewed and unchanged   Review of Systems:  Review of Systems  Constitutional: Negative for malaise/fatigue and weight loss.  HENT: Negative for hearing loss and tinnitus.   Eyes: Negative for blurred vision and double vision.  Respiratory: Negative for cough, shortness of breath and wheezing.   Cardiovascular: Negative for chest pain, palpitations, orthopnea, claudication and leg swelling.  Gastrointestinal: Negative for abdominal pain, blood in stool, constipation, diarrhea, heartburn, melena, nausea and vomiting.  Genitourinary: Negative.   Musculoskeletal: Negative for joint pain and myalgias.  Skin: Negative for rash.  Neurological: Negative for dizziness, tingling, sensory change, weakness and headaches.  Endo/Heme/Allergies: Negative for polydipsia.  Psychiatric/Behavioral: Negative.   All other systems reviewed and are negative.     Physical Exam: There were no vitals taken for this visit. Wt Readings from Last 3 Encounters:  06/29/18 214 lb  6.4 oz (97.3 kg)  06/16/18 213 lb (96.6 kg)  02/13/18 209 lb 6.4 oz (95 kg)   General Appearance: Well nourished, in no apparent distress. Eyes: PERRLA, EOMs, conjunctiva no swelling or erythema Sinuses: No Frontal/maxillary tenderness ENT/Mouth: Ext aud canals clear, TMs without erythema, bulging. No erythema, swelling, or exudate on post pharynx.  Tonsils not swollen or erythematous. Hearing normal.  Neck: Supple, thyroid normal.  Respiratory: Respiratory effort normal, BS equal bilaterally without rales, rhonchi, wheezing or stridor.  Cardio: RRR with no MRGs. Brisk peripheral pulses without edema.  Abdomen: Soft, + BS.  Non tender, no guarding, rebound, hernias, masses. Lymphatics: Non tender without lymphadenopathy.  Musculoskeletal: Full ROM, 5/5 strength, Normal gait Skin: Warm, dry without rashes, lesions, ecchymosis.  Neuro: Cranial nerves intact. No cerebellar symptoms.  Psych: Awake and oriented X 3, normal affect, Insight and Judgment appropriate.    Izora Ribas, NP 3:36 PM [facility]

## 2019-01-06 ENCOUNTER — Ambulatory Visit: Payer: Self-pay | Admitting: Adult Health

## 2019-01-06 ENCOUNTER — Other Ambulatory Visit: Payer: Self-pay | Admitting: Physician Assistant

## 2019-01-21 ENCOUNTER — Encounter: Payer: Self-pay | Admitting: Adult Health

## 2019-01-21 NOTE — Progress Notes (Signed)
Virtual Visit via Telephone Note  I connected with Matthew Salinas on 01/25/19 at  9:30 AM EDT by telephone and verified that I am speaking with the correct person using two identifiers.  Location: Patient: home Provider: [place] office   I discussed the limitations, risks, security and privacy concerns of performing an evaluation and management service by telephone and the availability of in person appointments. I also discussed with the patient that there may be a patient responsible charge related to this service. The patient expressed understanding and agreed to proceed.    I discussed the assessment and treatment plan with the patient. The patient was provided an opportunity to ask questions and all were answered. The patient agreed with the plan and demonstrated an understanding of the instructions.   The patient was advised to call back or seek an in-person evaluation if the symptoms worsen or if the condition fails to improve as anticipated.  I provided 28 minutes of non-face-to-face time during this encounter.   Izora Ribas, NP     FOLLOW UP  Assessment and Plan:   Hypertension Well controlled with current medications  Monitor blood pressure at home; patient to call if consistently greater than 130/80 Continue DASH diet.   Reminder to go to the ER if any CP, SOB, nausea, dizziness, severe HA, changes vision/speech, left arm numbness and tingling and jaw pain.  Cholesterol LDL and total cholesterol remain poorly controlled; he stopped zetia and rosuvastatin (20 mg three days a week) due to myagias and hyperglycemia; discussed likely r/t statin, plan to retry zetia only, he is working aggressively on diet; if remains severely elevated will refer to lipid clinic Very resistant to adding meds; discussed risks of untreated cholesterol  Continue low cholesterol diet and exercise.  Check lipid panel.   Other abnormal glucose Recent A1Cs well controlled  Continue diet  and exercise.  Perform daily foot/skin check, notify office of any concerning changes.  Defer A1C; check CMP  Overweight Long discussion about weight loss, diet, and exercise Recommended diet heavy in fruits and veggies and low in animal meats, cheeses, and dairy products, appropriate calorie intake Discussed ideal weight for height and  weight goal (200 lb) Will follow up in 3 months  Vitamin D Def At goal at last visit; he did change dose; continue supplementation to maintain goal of 60-100 Check Vit D level  Anxiety Well managed by current regimen; continue medications Stress management techniques discussed, increase water, good sleep hygiene discussed, increase exercise, and increase veggies.   Continue diet and meds as discussed. Further disposition pending results of labs. Discussed med's effects and SE's.   Over 30 minutes of exam, counseling, chart review, and critical decision making was performed.   Future Appointments  Date Time Provider [place]  07/22/2019  2:00 PM Unk Pinto, MD [facility] None    ----------------------------------------------------------------------------------------------------------------------  HPI 53 y.o. male  presents for 3 month follow up on hypertension, cholesterol, glucose management, obesity and vitamin D deficiency.    He has trigger finger that he has been monitoring; seems to get getting worse, has to loosen daily in AM, occasionally later in day as well. He reports established with ortho and will follow up if needed.   He had sleep study done by Dr. Brett Fairy in 2017, wasn't recommended CPAP due to results but he feels strongly study needs to be repeated at home for more accurate results. He will contact her office to discuss, she recommended oral appliance/ENT referral for  enlarged tonsils which he declined.   he has a diagnosis of anxiety and is currently on xanax PRN, reports symptoms are well controlled on current  regimen. He takes at bedtime 1-2 times a week at night.   BMI is Body mass index is 29.71 kg/m., he has been working on diet and exercise, he is very active in his yard/garden.  Wt Readings from Last 3 Encounters:  01/25/19 210 lb (95.3 kg)  06/29/18 214 lb 6.4 oz (97.3 kg)  06/16/18 213 lb (96.6 kg)   His blood pressure has been controlled at home (130s/70-80s), today their BP is BP: 140/80  He does workout. He denies chest pain, shortness of breath, dizziness.   He is not on cholesterol medication (was on zetia 10 mg daily and rosuvastatin 20 mg three days a week and reports had myalgias and blood sugar spiked which he attributed to medication and stopped). He strongly prefers to avoid medications.  His cholesterol is not at goal. The cholesterol last visit was:   Lab Results  Component Value Date   CHOL 276 (H) 06/29/2018   HDL 61 06/29/2018   LDLCALC 168 (H) 06/29/2018   TRIG 288 (H) 06/29/2018   CHOLHDL 4.5 06/29/2018    He has been working on diet and exercise for glucose management, and denies foot ulcerations, increased appetite, nausea, paresthesia of the feet, polydipsia, polyuria, visual disturbances, vomiting and weight loss. Last A1C in the office was:  Lab Results  Component Value Date   HGBA1C 5.2 06/29/2018   Patient is on Vitamin D supplement.   Lab Results  Component Value Date   VD25OH 40 06/29/2018       Current Medications:  Current Outpatient Medications on File Prior to Visit  Medication Sig  . ALPRAZolam (XANAX) 1 MG tablet TAKE 1/2 (ONE-HALF) TO 1 (ONE)  TABLET BY MOUTH 2-3 TIMES PER DAY AS NEEDED ANXIETY  . B Complex Vitamins (B COMPLEX PO) Take by mouth.  . cholecalciferol (VITAMIN D) 1000 UNITS tablet Take 5,000 Units by mouth daily.   . cyclobenzaprine (FLEXERIL) 10 MG tablet Take 1/2 to 1 tablet 2 to 3 x /day if needed for muscle spasm  . ezetimibe (ZETIA) 10 MG tablet Take 1 tablet (10 mg total) by mouth daily.  . hydrocortisone (PROCTOSOL HC)  2.5 % rectal cream APPLY  CREAM RECTALLY  4 TIMES DAILY (Patient taking differently: as needed. APPLY  CREAM RECTALLY  4 TIMES DAILY)  . lidocaine-prilocaine (EMLA) cream APPLY TOPICALLY AS NEEDED  . lisinopril-hydrochlorothiazide (PRINZIDE,ZESTORETIC) 20-25 MG tablet TAKE 1 TABLET BY MOUTH ONCE DAILY IN THE MORNING FOR BLOOD PRESSURE  . montelukast (SINGULAIR) 10 MG tablet Take 1 tablet daily for Allergy &Asthma (Patient taking differently: as needed. Take 1 tablet daily for Allergy &Asthma)  . Multiple Vitamins-Minerals (MULTIVITAMIN PO) Take by mouth.  . rosuvastatin (CRESTOR) 40 MG tablet Take 1/2 to 1 tablet daily or as directed for Cholesterol   No current facility-administered medications on file prior to visit.      Allergies:  Allergies  Allergen Reactions  . Lipitor [Atorvastatin]     Myalgias  . Prozac [Fluoxetine Hcl]   . Simvastatin     Myalgias     Medical History:  Past Medical History:  Diagnosis Date  . Allergy   . GERD (gastroesophageal reflux disease)   . Hyperlipidemia   . Hypertension   . IBS (irritable bowel syndrome)   . Sleep apnea 12/04/2015  . Traumatic pneumothorax 12/04/2015  . Vitamin  D deficiency    Family history- Reviewed and unchanged Social history- Reviewed and unchanged   Review of Systems:  Review of Systems  Constitutional: Negative for malaise/fatigue and weight loss.  HENT: Negative for hearing loss and tinnitus.   Eyes: Negative for blurred vision and double vision.  Respiratory: Negative for cough, shortness of breath and wheezing.   Cardiovascular: Negative for chest pain, palpitations, orthopnea, claudication and leg swelling.  Gastrointestinal: Negative for abdominal pain, blood in stool, constipation, diarrhea, heartburn, melena, nausea and vomiting.  Genitourinary: Negative.   Musculoskeletal: Negative for joint pain and myalgias.  Skin: Negative for rash.  Neurological: Negative for dizziness, tingling, sensory change,  weakness and headaches.  Endo/Heme/Allergies: Negative for polydipsia.  Psychiatric/Behavioral: Negative.   All other systems reviewed and are negative.     Physical Exam: BP 140/80   Pulse 88   Temp 97.9 F (36.6 C)   Wt 210 lb (95.3 kg)   BMI 29.71 kg/m  Wt Readings from Last 3 Encounters:  01/25/19 210 lb (95.3 kg)  06/29/18 214 lb 6.4 oz (97.3 kg)  06/16/18 213 lb (96.6 kg)   General : Well sounding patient in no apparent distress HEENT: no hoarseness, no cough for duration of visit Lungs: speaks in complete sentences, no audible wheezing, no apparent distress Neurological: alert, oriented x 3 Psychiatric: pleasant, judgement appropriate   Izora Ribas, NP 9:36 AM [facility]

## 2019-01-25 ENCOUNTER — Other Ambulatory Visit: Payer: Self-pay

## 2019-01-25 ENCOUNTER — Ambulatory Visit: Payer: BC Managed Care – PPO | Admitting: Adult Health

## 2019-01-25 ENCOUNTER — Encounter: Payer: Self-pay | Admitting: Adult Health

## 2019-01-25 ENCOUNTER — Other Ambulatory Visit: Payer: Self-pay | Admitting: Adult Health

## 2019-01-25 VITALS — BP 140/80 | HR 88 | Temp 97.9°F | Wt 210.0 lb

## 2019-01-25 DIAGNOSIS — R7309 Other abnormal glucose: Secondary | ICD-10-CM

## 2019-01-25 DIAGNOSIS — Z79899 Other long term (current) drug therapy: Secondary | ICD-10-CM

## 2019-01-25 DIAGNOSIS — E782 Mixed hyperlipidemia: Secondary | ICD-10-CM

## 2019-01-25 DIAGNOSIS — Z789 Other specified health status: Secondary | ICD-10-CM | POA: Insufficient documentation

## 2019-01-25 DIAGNOSIS — E669 Obesity, unspecified: Secondary | ICD-10-CM

## 2019-01-25 DIAGNOSIS — E559 Vitamin D deficiency, unspecified: Secondary | ICD-10-CM | POA: Diagnosis not present

## 2019-01-25 DIAGNOSIS — I10 Essential (primary) hypertension: Secondary | ICD-10-CM

## 2019-01-25 DIAGNOSIS — F419 Anxiety disorder, unspecified: Secondary | ICD-10-CM

## 2019-02-01 ENCOUNTER — Other Ambulatory Visit: Payer: BC Managed Care – PPO

## 2019-02-04 ENCOUNTER — Telehealth: Payer: Self-pay | Admitting: *Deleted

## 2019-02-04 NOTE — Telephone Encounter (Signed)
Patient called and asked about Omeprazole.  Per the patient he understands Omeprazole was recalled due to the possibility of causing cancer.  Per Dr Melford Aase, Zantac was recalled ,but it is still OK to take OTC Omeprazole or Nexium. Amessage was left to inform the patient.

## 2019-02-09 ENCOUNTER — Other Ambulatory Visit: Payer: BC Managed Care – PPO

## 2019-02-09 ENCOUNTER — Other Ambulatory Visit: Payer: Self-pay

## 2019-02-09 DIAGNOSIS — Z79899 Other long term (current) drug therapy: Secondary | ICD-10-CM | POA: Diagnosis not present

## 2019-02-09 DIAGNOSIS — F419 Anxiety disorder, unspecified: Secondary | ICD-10-CM | POA: Diagnosis not present

## 2019-02-09 DIAGNOSIS — E782 Mixed hyperlipidemia: Secondary | ICD-10-CM

## 2019-02-09 DIAGNOSIS — I10 Essential (primary) hypertension: Secondary | ICD-10-CM

## 2019-02-10 LAB — COMPLETE METABOLIC PANEL WITH GFR
AG Ratio: 1.6 (calc) (ref 1.0–2.5)
ALT: 35 U/L (ref 9–46)
AST: 29 U/L (ref 10–35)
Albumin: 4.4 g/dL (ref 3.6–5.1)
Alkaline phosphatase (APISO): 47 U/L (ref 35–144)
BUN: 13 mg/dL (ref 7–25)
CO2: 28 mmol/L (ref 20–32)
Calcium: 9.4 mg/dL (ref 8.6–10.3)
Chloride: 100 mmol/L (ref 98–110)
Creat: 0.87 mg/dL (ref 0.70–1.33)
GFR, Est African American: 113 mL/min/{1.73_m2} (ref >=60)
GFR, Est Non African American: 98 mL/min/{1.73_m2} (ref >=60)
Globulin: 2.7 g/dL (calc) (ref 1.9–3.7)
Glucose, Bld: 99 mg/dL (ref 65–99)
Potassium: 4.5 mmol/L (ref 3.5–5.3)
Sodium: 137 mmol/L (ref 135–146)
Total Bilirubin: 0.8 mg/dL (ref 0.2–1.2)
Total Protein: 7.1 g/dL (ref 6.1–8.1)

## 2019-02-10 LAB — CBC WITH DIFFERENTIAL/PLATELET
Absolute Monocytes: 390 cells/uL (ref 200–950)
Basophils Absolute: 57 cells/uL (ref 0–200)
Basophils Relative: 1.1 %
Eosinophils Absolute: 78 cells/uL (ref 15–500)
Eosinophils Relative: 1.5 %
HCT: 42.2 % (ref 38.5–50.0)
Hemoglobin: 14.5 g/dL (ref 13.2–17.1)
Lymphs Abs: 1763 cells/uL (ref 850–3900)
MCH: 32.2 pg (ref 27.0–33.0)
MCHC: 34.4 g/dL (ref 32.0–36.0)
MCV: 93.8 fL (ref 80.0–100.0)
MPV: 10.8 fL (ref 7.5–12.5)
Monocytes Relative: 7.5 %
Neutro Abs: 2912 cells/uL (ref 1500–7800)
Neutrophils Relative %: 56 %
Platelets: 301 10*3/uL (ref 140–400)
RBC: 4.5 10*6/uL (ref 4.20–5.80)
RDW: 11.7 % (ref 11.0–15.0)
Total Lymphocyte: 33.9 %
WBC: 5.2 10*3/uL (ref 3.8–10.8)

## 2019-02-10 LAB — LIPID PANEL
Cholesterol: 299 mg/dL — ABNORMAL HIGH (ref <200)
HDL: 55 mg/dL (ref >=40)
LDL Cholesterol (Calc): 187 mg/dL (calc) — ABNORMAL HIGH
Non-HDL Cholesterol (Calc): 244 mg/dL (calc) — ABNORMAL HIGH (ref <130)
Total CHOL/HDL Ratio: 5.4 (calc) — ABNORMAL HIGH (ref <5.0)
Triglycerides: 339 mg/dL — ABNORMAL HIGH (ref <150)

## 2019-02-10 LAB — TSH: TSH: 1.88 mIU/L (ref 0.40–4.50)

## 2019-02-10 LAB — MAGNESIUM: Magnesium: 2 mg/dL (ref 1.5–2.5)

## 2019-03-22 ENCOUNTER — Other Ambulatory Visit: Payer: Self-pay | Admitting: Internal Medicine

## 2019-03-30 ENCOUNTER — Other Ambulatory Visit: Payer: Self-pay | Admitting: Adult Health

## 2019-05-02 ENCOUNTER — Other Ambulatory Visit: Payer: Self-pay | Admitting: Internal Medicine

## 2019-06-29 ENCOUNTER — Other Ambulatory Visit: Payer: Self-pay | Admitting: Internal Medicine

## 2019-07-22 ENCOUNTER — Ambulatory Visit (INDEPENDENT_AMBULATORY_CARE_PROVIDER_SITE_OTHER): Payer: Self-pay | Admitting: Internal Medicine

## 2019-07-22 DIAGNOSIS — Z5329 Procedure and treatment not carried out because of patient's decision for other reasons: Secondary | ICD-10-CM

## 2019-07-22 NOTE — Progress Notes (Addendum)
NO SHOW

## 2019-07-25 ENCOUNTER — Encounter: Payer: Self-pay | Admitting: Internal Medicine

## 2019-07-25 NOTE — Patient Instructions (Signed)

## 2019-07-25 NOTE — Progress Notes (Signed)
History of Present Illness:      This very nice 53 y.o. [name]  presents for 6 month follow up with HTN, HLD, Pre-Diabetes and Vitamin D Deficiency.  He also has c/o about a painful flare-up of hemorrhoids about 2-3 weeks ago, but relates sx's have resolved.       Patient is treated for HTN (2005) & BP has been controlled at home. Today's BP is at goal - 114/80. Patient has had no complaints of any cardiac type chest pain, palpitations, dyspnea / orthopnea / PND, dizziness, claudication, or dependent edema.      Patient is Statin Intolerant and his Hyperlipidemia is not controlled with diet & Ezetimibe. Patient denies myalgias or other med SE's. Last Lipids were not at goal:  Lab Results  Component Value Date   CHOL 299 (H) 02/09/2019   HDL 55 02/09/2019   LDLCALC 187 (H) 02/09/2019   TRIG 339 (H) 02/09/2019   CHOLHDL 5.4 (H) 02/09/2019        Also, the patient is followed for Glucose intolerance  and has had no symptoms of reactive hypoglycemia, diabetic polys, paresthesias or visual blurring.  Last A1c was Normal & at goal:  Lab Results  Component Value Date   HGBA1C 5.2 06/29/2018        Further, the patient also has history of Vitamin D Deficiency ("34" / 2016) and supplements vitamin D without any suspected side-effects. Last vitamin D was at goal:  Lab Results  Component Value Date   VD25OH 71 06/29/2018    Current Outpatient Medications on File Prior to Visit  Medication Sig  . ALPRAZolam (XANAX) 1 MG tablet TAKE 1/2-1 TABLET BY MOUTH 2-3 TIMES DAILY ONLY IF NEEDED FOR ANXIETY ATTACK AND LIMIT TO 5 DAYS/WEEK TO AVOID ADDICTION  . B Complex Vitamins (B COMPLEX PO) Take by mouth.  . cholecalciferol (VITAMIN D) 1000 UNITS tablet Take 5,000 Units by mouth daily.   . cyclobenzaprine (FLEXERIL) 10 MG tablet Take 1/2 to 1 tablet 2 to 3 x /day if needed for muscle spasm  . lidocaine-prilocaine (EMLA) cream APPLY TOPICALLY AS NEEDED  . lisinopril-hydrochlorothiazide  (ZESTORETIC) 20-25 MG tablet Take 1 tablet Daily for BP & Fluid Retention / Ankle Sswelling  . montelukast (SINGULAIR) 10 MG tablet Take 1 tablet daily for Allergy &Asthma (Patient taking differently: as needed. Take 1 tablet daily for Allergy &Asthma)  . Multiple Vitamins-Minerals (MULTIVITAMIN PO) Take by mouth.  Marland Kitchen PROCTOZONE-HC 2.5 % rectal cream APPLY  CREAM RECTALLY 4 TIMES DAILY   No current facility-administered medications on file prior to visit.    Allergies  Allergen Reactions  . Lipitor [Atorvastatin]     Myalgias  . Prozac [Fluoxetine Hcl]   . Rosuvastatin     myalgias  . Simvastatin     Myalgias    PMHx:   Past Medical History:  Diagnosis Date  . Allergy   . GERD (gastroesophageal reflux disease)   . Hyperlipidemia   . Hypertension   . IBS (irritable bowel syndrome)   . Sleep apnea 12/04/2015  . Traumatic pneumothorax 12/04/2015  . Vitamin D deficiency    Immunization History  Administered Date(s) Administered  . PPD Test 01/19/2014, 03/27/2016, 04/28/2017, 06/29/2018  . Pneumococcal-Unspecified 01/06/2012  . Td 06/10/2006  . Tdap 03/27/2016   Past Surgical History:  Procedure Laterality Date  . CHEST TUBE INSERTION Right 05-15-02   thoracostomy  . TOE SURGERY  2008   right big toe  FHx:    Reviewed / unchanged  SHx:    Reviewed / unchanged   Systems Review:  Constitutional: Denies fever, chills, wt changes, headaches, insomnia, fatigue, night sweats, change in appetite. Eyes: Denies redness, blurred vision, diplopia, discharge, itchy, watery eyes.  ENT: Denies discharge, congestion, post nasal drip, epistaxis, sore throat, earache, hearing loss, dental pain, tinnitus, vertigo, sinus pain, snoring.  CV: Denies chest pain, palpitations, irregular heartbeat, syncope, dyspnea, diaphoresis, orthopnea, PND, claudication or edema. Respiratory: denies cough, dyspnea, DOE, pleurisy, hoarseness, laryngitis, wheezing.  Gastrointestinal: Denies dysphagia,  odynophagia, heartburn, reflux, water brash, abdominal pain or cramps, nausea, vomiting, bloating, diarrhea, constipation, hematemesis, melena, hematochezia  or hemorrhoids. Genitourinary: Denies dysuria, frequency, urgency, nocturia, hesitancy, discharge, hematuria or flank pain. Musculoskeletal: Denies arthralgias, myalgias, stiffness, jt. swelling, pain, limping or strain/sprain.  Skin: Denies pruritus, rash, hives, warts, acne, eczema or change in skin lesion(s). Neuro: No weakness, tremor, incoordination, spasms, paresthesia or pain. Psychiatric: Denies confusion, memory loss or sensory loss. Endo: Denies change in weight, skin or hair change.  Heme/Lymph: No excessive bleeding, bruising or enlarged lymph nodes.  Physical Exam  BP 114/80   Pulse 80   Temp (!) 97.2 F (36.2 C)   Resp 16   Ht 5' 10.5" (1.791 m)   Wt 225 lb 12.8 oz (102.4 kg)   BMI 31.94 kg/m   Appears  well nourished, well groomed  and in no distress.  Eyes: PERRLA, EOMs, conjunctiva no swelling or erythema. Sinuses: No frontal/maxillary tenderness ENT/Mouth: EAC's clear, TM's nl w/o erythema, bulging. Nares clear w/o erythema, swelling, exudates. Oropharynx clear without erythema or exudates. Oral hygiene is good. Tongue normal, non obstructing. Hearing intact.  Neck: Supple. Thyroid not palpable. Car 2+/2+ without bruits, nodes or JVD. Chest: Respirations nl with BS clear & equal w/o rales, rhonchi, wheezing or stridor.  Cor: Heart sounds normal w/ regular rate and rhythm without sig. murmurs, gallops, clicks or rubs. Peripheral pulses normal and equal  without edema.  Abdomen: Soft & bowel sounds normal. Non-tender w/o guarding, rebound, hernias, masses or organomegaly.  GU. No ext hemorrhoids nor anal fissure noted. DRE finds Nl prostate and no palpable masses or Int hemorrhoids. Stool hemoccult - Negative.  Lymphatics: Unremarkable.  Musculoskeletal: Full ROM all peripheral extremities, joint stability, 5/5  strength and normal gait.  Skin: Warm, dry without exposed rashes, lesions or ecchymosis apparent.  Neuro: Cranial nerves intact, reflexes equal bilaterally. Sensory-motor testing grossly intact. Tendon reflexes grossly intact.  Pysch: Alert & oriented x 3.  Insight and judgement nl & appropriate. No ideations.  Assessment and Plan:  1. Essential hypertension  - Continue medication, monitor blood pressure at home.  - Continue DASH diet.  Reminder to go to the ER if any CP,  SOB, nausea, dizziness, severe HA, changes vision/speech.  - CBC with Diff - COMPLETE METABOLIC PANEL WITH GFR - Magnesium - TSH  2. Hyperlipidemia, mixed  - Continue diet/meds, exercise,& lifestyle modifications.  - Continue monitor periodic cholesterol/liver & renal functions   - Lipid Profile - TSH  3. Abnormal glucose  - Continue diet, exercise  - Lifestyle modifications.  - Monitor appropriate labs.  - Hemoglobin A1c ([facility]) - Insulin, random  4. Vitamin D deficiency  - Continue supplementation.  - Vitamin D (25 hydroxy)  5. Medication management  - CBC with Diff - COMPLETE METABOLIC PANEL WITH GFR - Magnesium - Lipid Profile - TSH - Hemoglobin A1c ([facility]) - Insulin, random - Vitamin D (25 hydroxy)  Discussed  regular exercise, BP monitoring, weight control to achieve/maintain BMI less than 25 and discussed med and SE's. Recommended labs to assess and monitor clinical status with further disposition pending results of labs.  I discussed the assessment and treatment plan with the patient. The patient was provided an opportunity to ask questions and all were answered. The patient agreed with the plan and demonstrated an understanding of the instructions.  I provided over 30 minutes of exam, counseling, chart review and  complex critical decision making.  Kirtland Bouchard, MD

## 2019-07-26 ENCOUNTER — Other Ambulatory Visit: Payer: Self-pay

## 2019-07-26 ENCOUNTER — Ambulatory Visit: Payer: BC Managed Care – PPO | Admitting: Internal Medicine

## 2019-07-26 VITALS — BP 114/80 | HR 80 | Temp 97.2°F | Resp 16 | Ht 70.5 in | Wt 225.8 lb

## 2019-07-26 DIAGNOSIS — R7309 Other abnormal glucose: Secondary | ICD-10-CM

## 2019-07-26 DIAGNOSIS — Z79899 Other long term (current) drug therapy: Secondary | ICD-10-CM | POA: Diagnosis not present

## 2019-07-26 DIAGNOSIS — I10 Essential (primary) hypertension: Secondary | ICD-10-CM | POA: Diagnosis not present

## 2019-07-26 DIAGNOSIS — E559 Vitamin D deficiency, unspecified: Secondary | ICD-10-CM

## 2019-07-26 DIAGNOSIS — E782 Mixed hyperlipidemia: Secondary | ICD-10-CM | POA: Diagnosis not present

## 2019-07-26 MED ORDER — EZETIMIBE 10 MG PO TABS: ORAL_TABLET | ORAL | 3 refills | Status: DC

## 2019-07-27 ENCOUNTER — Other Ambulatory Visit: Payer: Self-pay | Admitting: Internal Medicine

## 2019-07-27 DIAGNOSIS — E782 Mixed hyperlipidemia: Secondary | ICD-10-CM

## 2019-07-27 LAB — INSULIN, RANDOM: Insulin: 4.9 u[IU]/mL

## 2019-07-27 LAB — CBC WITH DIFFERENTIAL/PLATELET
Absolute Monocytes: 593 cells/uL (ref 200–950)
Basophils Absolute: 54 cells/uL (ref 0–200)
Basophils Relative: 0.7 %
Eosinophils Absolute: 77 cells/uL (ref 15–500)
Eosinophils Relative: 1 %
HCT: 42.3 % (ref 38.5–50.0)
Hemoglobin: 14.6 g/dL (ref 13.2–17.1)
Lymphs Abs: 2248 cells/uL (ref 850–3900)
MCH: 31.4 pg (ref 27.0–33.0)
MCHC: 34.5 g/dL (ref 32.0–36.0)
MCV: 91 fL (ref 80.0–100.0)
MPV: 11.1 fL (ref 7.5–12.5)
Monocytes Relative: 7.7 %
Neutro Abs: 4728 cells/uL (ref 1500–7800)
Neutrophils Relative %: 61.4 %
Platelets: 296 10*3/uL (ref 140–400)
RBC: 4.65 10*6/uL (ref 4.20–5.80)
RDW: 11.7 % (ref 11.0–15.0)
Total Lymphocyte: 29.2 %
WBC: 7.7 10*3/uL (ref 3.8–10.8)

## 2019-07-27 LAB — COMPLETE METABOLIC PANEL WITH GFR
AG Ratio: 1.8 (calc) (ref 1.0–2.5)
ALT: 37 U/L (ref 9–46)
AST: 38 U/L — ABNORMAL HIGH (ref 10–35)
Albumin: 4.5 g/dL (ref 3.6–5.1)
Alkaline phosphatase (APISO): 52 U/L (ref 35–144)
BUN: 17 mg/dL (ref 7–25)
CO2: 24 mmol/L (ref 20–32)
Calcium: 9.7 mg/dL (ref 8.6–10.3)
Chloride: 103 mmol/L (ref 98–110)
Creat: 1.03 mg/dL (ref 0.70–1.33)
GFR, Est African American: 94 mL/min/{1.73_m2} (ref >=60)
GFR, Est Non African American: 81 mL/min/{1.73_m2} (ref >=60)
Globulin: 2.5 g/dL (calc) (ref 1.9–3.7)
Glucose, Bld: 94 mg/dL (ref 65–99)
Potassium: 3.9 mmol/L (ref 3.5–5.3)
Sodium: 138 mmol/L (ref 135–146)
Total Bilirubin: 0.4 mg/dL (ref 0.2–1.2)
Total Protein: 7 g/dL (ref 6.1–8.1)

## 2019-07-27 LAB — MAGNESIUM: Magnesium: 1.8 mg/dL (ref 1.5–2.5)

## 2019-07-27 LAB — LIPID PANEL
Cholesterol: 312 mg/dL — ABNORMAL HIGH (ref <200)
HDL: 46 mg/dL (ref >=40)
LDL Cholesterol (Calc): 206 mg/dL (calc) — ABNORMAL HIGH
Non-HDL Cholesterol (Calc): 266 mg/dL (calc) — ABNORMAL HIGH (ref <130)
Total CHOL/HDL Ratio: 6.8 (calc) — ABNORMAL HIGH (ref <5.0)
Triglycerides: 355 mg/dL — ABNORMAL HIGH (ref <150)

## 2019-07-27 LAB — VITAMIN D 25 HYDROXY (VIT D DEFICIENCY, FRACTURES): Vit D, 25-Hydroxy: 51 ng/mL (ref 30–100)

## 2019-07-27 LAB — HEMOGLOBIN A1C
Hgb A1c MFr Bld: 5.2 % of total Hgb (ref <5.7)
Mean Plasma Glucose: 103 (calc)
eAG (mmol/L): 5.7 (calc)

## 2019-07-27 LAB — TSH: TSH: 2.25 mIU/L (ref 0.40–4.50)

## 2019-07-27 MED ORDER — LIVALO 2 MG PO TABS: ORAL_TABLET | ORAL | 1 refills | Status: DC

## 2019-08-19 ENCOUNTER — Other Ambulatory Visit: Payer: Self-pay | Admitting: Internal Medicine

## 2019-09-13 ENCOUNTER — Encounter: Payer: BC Managed Care – PPO | Admitting: Internal Medicine

## 2019-10-24 ENCOUNTER — Other Ambulatory Visit: Payer: Self-pay | Admitting: Internal Medicine

## 2019-10-24 MED ORDER — ALPRAZOLAM 1 MG PO TABS: ORAL_TABLET | ORAL | 0 refills | Status: DC

## 2019-10-27 ENCOUNTER — Encounter: Payer: Self-pay | Admitting: Internal Medicine

## 2019-10-27 NOTE — Progress Notes (Signed)
Annual  Screening/Preventative Visit  & Comprehensive Evaluation & Examination     This very nice 54 y.o.  [name] presents for a Screening /Preventative Visit & comprehensive evaluation and management of multiple medical co-morbidities.  Patient has been followed for HTN, HLD, Prediabetes and Vitamin D Deficiency. Patient was evaluated  by Dr Brett Fairy and sleep study showed only snoring w/o apnea and he declined f/u or referral for an oral appliance.     HTN predates since 2005. Patient's BP has been controlled at home.  Today's BP is at goal - 126/82. Patient denies any cardiac symptoms as chest pain, palpitations, shortness of breath, dizziness or ankle swelling.     Patient is Statin Intolerant and his hyperlipidemia is not controlled with diet and never got Livalo filled Livalo & never started Zetia . Patient denies myalgias or other medication SE's. Last lipids were not at goal:  Lab Results  Component Value Date   CHOL 312 (H) 07/26/2019   HDL 46 07/26/2019   LDLCALC 206 (H) 07/26/2019   TRIG 355 (H) 07/26/2019   CHOLHDL 6.8 (H) 07/26/2019       Patient is followed expectantly for Glucose intolerance and patient denies reactive hypoglycemic symptoms, visual blurring, diabetic polys or paresthesias. Last A1c was Normal & at goal:  Lab Results  Component Value Date   HGBA1C 5.2 07/26/2019        Finally, patient has history of Vitamin D Deficiency ("34" / 2016) and last vitamin D was slightly Low:  Lab Results  Component Value Date   VD25OH 63 07/26/2019    Current Outpatient Medications on File Prior to Visit  Medication Sig  . ALPRAZolam (XANAX) 1 MG tablet Take 1/2 to 1 tablet 2 to 3 x / day  ONLY if needed for Anxiety Attack & please try to limit to 5 days /week to avoid Addiction & Dementia  . cholecalciferol (VITAMIN D) 1000 UNITS tablet Take 5,000 Units by mouth daily.   . cyclobenzaprine (FLEXERIL) 10 MG tablet Take 1/2 to 1 tablet 2 to 3 x /day if needed for muscle  spasm  . lidocaine-prilocaine (EMLA) cream APPLY TOPICALLY AS NEEDED  . lisinopril-hydrochlorothiazide (ZESTORETIC) 20-25 MG tablet Take 1 tablet Daily for BP & Fluid Retention / Ankle Sswelling  . montelukast (SINGULAIR) 10 MG tablet Take 1 tablet Daily for Allergies & Asthma  . Multiple Vitamins-Minerals (MULTIVITAMIN PO) Take by mouth.  Marland Kitchen PROCTOZONE-HC 2.5 % rectal cream APPLY  CREAM RECTALLY 4 TIMES DAILY  . ezetimibe (ZETIA) 10 MG tablet Take 1 tablet Daily for Cholesterol (Patient not taking: Reported on 10/28/2019)  . Pitavastatin Calcium (LIVALO) 2 MG TABS Take 1 tablet Daily for Cholesterol (Patient not taking: Reported on 10/28/2019)   No current facility-administered medications on file prior to visit.   Allergies  Allergen Reactions  . Lipitor [Atorvastatin]     Myalgias  . Prozac [Fluoxetine Hcl]   . Rosuvastatin     myalgias  . Simvastatin     Myalgias   Past Medical History:  Diagnosis Date  . Allergy   . GERD (gastroesophageal reflux disease)   . Hyperlipidemia   . Hypertension   . IBS (irritable bowel syndrome)   . Sleep apnea 12/04/2015  . Traumatic pneumothorax 12/04/2015  . Vitamin D deficiency    Health Maintenance  Topic Date Due  . INFLUENZA VACCINE  Never done  . COLONOSCOPY  06/27/2025  . TETANUS/TDAP  03/27/2026  . Hepatitis C Screening  Completed  . HIV  Screening  Completed   Immunization History  Administered Date(s) Administered  . PPD Test 01/19/2014, 03/27/2016, 04/28/2017, 06/29/2018, 10/28/2019  . Pneumococcal-Unspecified 01/06/2012  . Td 06/10/2006  . Tdap 03/27/2016   Last Colon -  06/28/2015 - Dr Alexandria Lodge - recc 10 yr f/u due Nov 2026  Past Surgical History:  Procedure Laterality Date  . CHEST TUBE INSERTION Right 05-15-02   thoracostomy  . TOE SURGERY  2008   right big toe   Family History  Problem Relation Age of Onset  . Cancer Mother        lung  . Hypertension Father   . Hyperlipidemia Father   . Dementia Brother   .  Colon cancer Maternal Grandmother    Social History   Socioeconomic History  . Marital status: Divorced  . Number of children: 1 daughter   Occupational History  .  Retired Company secretary for [employer] / Now Engineer, structural for [employer]  Tobacco Use  . Smoking status: Former Smoker    Quit date: 08/05/2001    Years since quitting: 18.2  . Smokeless tobacco: Never Used  . Tobacco comment: Smoked 5-10 cigarettes per day since age 29  Substance and Sexual Activity  . Alcohol use: Yes    Alcohol/week: 10.0 standard drinks    Types: 10 Standard drinks or equivalent per week  . Drug use: No  . Sexual activity: Not on file     ROS Constitutional: Denies fever, chills, weight loss/gain, headaches, insomnia,  night sweats or change in appetite. Does c/o fatigue. Eyes: Denies redness, blurred vision, diplopia, discharge, itchy or watery eyes.  ENT: Denies discharge, congestion, post nasal drip, epistaxis, sore throat, earache, hearing loss, dental pain, Tinnitus, Vertigo, Sinus pain or snoring.  Cardio: Denies chest pain, palpitations, irregular heartbeat, syncope, dyspnea, diaphoresis, orthopnea, PND, claudication or edema Respiratory: denies cough, dyspnea, DOE, pleurisy, hoarseness, laryngitis or wheezing.  Gastrointestinal: Denies dysphagia, heartburn, reflux, water brash, pain, cramps, nausea, vomiting, bloating, diarrhea, constipation, hematemesis, melena, hematochezia, jaundice or hemorrhoids Genitourinary: Denies dysuria, frequency, urgency, nocturia, hesitancy, discharge, hematuria or flank pain Musculoskeletal: Denies arthralgia, myalgia, stiffness, Jt. Swelling, pain, limp or strain/sprain. Denies Falls. Skin: Denies puritis, rash, hives, warts, acne, eczema or change in skin lesion Neuro: No weakness, tremor, incoordination, spasms, paresthesia or pain Psychiatric: Denies confusion, memory loss or sensory loss. Denies Depression. Endocrine: Denies change  in weight, skin, hair change, nocturia, and paresthesia, diabetic polys, visual blurring or hyper / hypo glycemic episodes.  Heme/Lymph: No excessive bleeding, bruising or enlarged lymph nodes.  Physical Exam  BP 126/82   Pulse 76   Temp (!) 97.2 F (36.2 C)   Resp 16   Ht 5\' 11"  (1.803 m)   Wt 226 lb (102.5 kg)   BMI 31.52 kg/m   General Appearance: Well nourished and well groomed and in no apparent distress.  Eyes: PERRLA, EOMs, conjunctiva no swelling or erythema, normal fundi and vessels. Sinuses: No frontal/maxillary tenderness ENT/Mouth: EACs patent / TMs  nl. Nares clear without erythema, swelling, mucoid exudates. Oral hygiene is good. No erythema, swelling, or exudate. Tongue normal, non-obstructing. Tonsils not swollen or erythematous. Hearing normal.  Neck: Supple, thyroid not palpable. No bruits, nodes or JVD. Respiratory: Respiratory effort normal.  BS equal and clear bilateral without rales, rhonci, wheezing or stridor. Cardio: Heart sounds are normal with regular rate and rhythm and no murmurs, rubs or gallops. Peripheral pulses are normal and equal bilaterally without edema. No aortic or femoral bruits.  Chest: symmetric with normal excursions and percussion.  Abdomen: Soft, with Nl bowel sounds. Nontender, no guarding, rebound, hernias, masses, or organomegaly.  Lymphatics: Non tender without lymphadenopathy.  Musculoskeletal: Full ROM all peripheral extremities, joint stability, 5/5 strength, and normal gait. Skin: Warm and dry without rashes, lesions, cyanosis, clubbing or  ecchymosis.  Neuro: Cranial nerves intact, reflexes equal bilaterally. Normal muscle tone, no cerebellar symptoms. Sensation intact.  Pysch: Alert and oriented X 3 with normal affect, insight and judgment appropriate.   Assessment and Plan  1. Annual Preventative/Screening Exam   2. Essential hypertension  - EKG 12-Lead - [place], RETROPERITNL ABD,  LTD - Urinalysis, Routine w reflex  microscopic - Microalbumin / creatinine urine ratio - CBC with Differential/Platelet - COMPLETE METABOLIC PANEL WITH GFR - Magnesium - TSH  3. Hyperlipidemia, mixed  - EKG 12-Lead - [place], RETROPERITNL ABD,  LTD - Lipid panel - TSH  4. Abnormal glucose  - EKG 12-Lead - [place], RETROPERITNL ABD,  LTD - Hemoglobin A1c - Insulin, random  5. Vitamin D deficiency  - VITAMIN D 25 Hydroxy  6. Statin intolerance  - Lipid panel  7. Screening for colorectal cancer  - POC Hemoccult Bld/Stl  8. Prostate cancer screening  - PSA  9. Screening examination for pulmonary tuberculosis  - TB Skin Test  10. Screening for ischemic heart disease  - EKG 12-Lead  11. FHx: heart disease  - EKG 12-Lead - [place], RETROPERITNL ABD,  LTD  12. Former smoker  - EKG 12-Lead - [place], RETROPERITNL ABD,  LTD  13. Screening for AAA (aortic abdominal aneurysm)  - [place], RETROPERITNL ABD,  LTD  14. Fatigue, unspecified type  - Iron,Total/Total Iron Binding Cap - Vitamin B12 - Testosterone - CBC with Differential/Platelet - TSH  15. Medication management  - Urinalysis, Routine w reflex microscopic - Microalbumin / creatinine urine ratio - CBC with Differential/Platelet - COMPLETE METABOLIC PANEL WITH GFR - Magnesium - Lipid panel - TSH - Hemoglobin A1c - Insulin, random - VITAMIN D 25 Hydroxy   16. Osteoarthritis of multiple joints, unspecified osteoarthritis type  - meloxicam (MOBIC) 15 MG tablet; Take 1/2 to 1 tablet Daily with Food for Pain & Inflammation  Dispense: 90 tablet; Refill: 0  17. Gastroesophageal reflux disease   - omeprazole (PRILOSEC) 40 MG capsule; Take 1 capsule Daily for Heartburn & Indigestion  Dispense: 90 capsule; Refill: 0         Patient was counseled in prudent diet, weight control to achieve/maintain BMI less than 25, BP monitoring, regular exercise and medications as discussed.  Discussed med effects and SE's. Routine screening labs and tests as requested  with regular follow-up as recommended. Over 40 minutes of exam, counseling, chart review and high complex critical decision making was performed   Kirtland Bouchard, MD

## 2019-10-27 NOTE — Patient Instructions (Signed)

## 2019-10-28 ENCOUNTER — Other Ambulatory Visit: Payer: Self-pay

## 2019-10-28 ENCOUNTER — Ambulatory Visit (INDEPENDENT_AMBULATORY_CARE_PROVIDER_SITE_OTHER): Payer: BC Managed Care – PPO | Admitting: Internal Medicine

## 2019-10-28 VITALS — BP 126/82 | HR 76 | Temp 97.2°F | Resp 16 | Ht 71.0 in | Wt 226.0 lb

## 2019-10-28 DIAGNOSIS — E559 Vitamin D deficiency, unspecified: Secondary | ICD-10-CM

## 2019-10-28 DIAGNOSIS — Z13220 Encounter for screening for lipoid disorders: Secondary | ICD-10-CM | POA: Diagnosis not present

## 2019-10-28 DIAGNOSIS — R5383 Other fatigue: Secondary | ICD-10-CM

## 2019-10-28 DIAGNOSIS — Z0000 Encounter for general adult medical examination without abnormal findings: Secondary | ICD-10-CM

## 2019-10-28 DIAGNOSIS — E782 Mixed hyperlipidemia: Secondary | ICD-10-CM

## 2019-10-28 DIAGNOSIS — K219 Gastro-esophageal reflux disease without esophagitis: Secondary | ICD-10-CM

## 2019-10-28 DIAGNOSIS — Z111 Encounter for screening for respiratory tuberculosis: Secondary | ICD-10-CM

## 2019-10-28 DIAGNOSIS — R7309 Other abnormal glucose: Secondary | ICD-10-CM

## 2019-10-28 DIAGNOSIS — Z125 Encounter for screening for malignant neoplasm of prostate: Secondary | ICD-10-CM

## 2019-10-28 DIAGNOSIS — Z131 Encounter for screening for diabetes mellitus: Secondary | ICD-10-CM | POA: Diagnosis not present

## 2019-10-28 DIAGNOSIS — Z136 Encounter for screening for cardiovascular disorders: Secondary | ICD-10-CM

## 2019-10-28 DIAGNOSIS — Z8249 Family history of ischemic heart disease and other diseases of the circulatory system: Secondary | ICD-10-CM

## 2019-10-28 DIAGNOSIS — Z1329 Encounter for screening for other suspected endocrine disorder: Secondary | ICD-10-CM | POA: Diagnosis not present

## 2019-10-28 DIAGNOSIS — N401 Enlarged prostate with lower urinary tract symptoms: Secondary | ICD-10-CM | POA: Diagnosis not present

## 2019-10-28 DIAGNOSIS — Z79899 Other long term (current) drug therapy: Secondary | ICD-10-CM

## 2019-10-28 DIAGNOSIS — M159 Polyosteoarthritis, unspecified: Secondary | ICD-10-CM

## 2019-10-28 DIAGNOSIS — I10 Essential (primary) hypertension: Secondary | ICD-10-CM | POA: Diagnosis not present

## 2019-10-28 DIAGNOSIS — R350 Frequency of micturition: Secondary | ICD-10-CM

## 2019-10-28 DIAGNOSIS — Z1389 Encounter for screening for other disorder: Secondary | ICD-10-CM | POA: Diagnosis not present

## 2019-10-28 DIAGNOSIS — Z130 Encounter for screening for diseases of the blood and blood-forming organs and certain disorders involving the immune mechanism: Secondary | ICD-10-CM | POA: Diagnosis not present

## 2019-10-28 DIAGNOSIS — Z0001 Encounter for general adult medical examination with abnormal findings: Secondary | ICD-10-CM

## 2019-10-28 DIAGNOSIS — Z87891 Personal history of nicotine dependence: Secondary | ICD-10-CM

## 2019-10-28 DIAGNOSIS — Z1211 Encounter for screening for malignant neoplasm of colon: Secondary | ICD-10-CM

## 2019-10-28 DIAGNOSIS — Z789 Other specified health status: Secondary | ICD-10-CM

## 2019-10-28 MED ORDER — MELOXICAM 15 MG PO TABS: ORAL_TABLET | ORAL | 0 refills | Status: DC

## 2019-10-28 MED ORDER — OMEPRAZOLE 40 MG PO CPDR: DELAYED_RELEASE_CAPSULE | ORAL | 0 refills | Status: DC

## 2019-10-29 ENCOUNTER — Other Ambulatory Visit: Payer: Self-pay | Admitting: Internal Medicine

## 2019-10-29 DIAGNOSIS — E782 Mixed hyperlipidemia: Secondary | ICD-10-CM

## 2019-10-29 LAB — URINALYSIS, ROUTINE W REFLEX MICROSCOPIC
Bilirubin Urine: NEGATIVE
Glucose, UA: NEGATIVE
Hgb urine dipstick: NEGATIVE
Ketones, ur: NEGATIVE
Leukocytes,Ua: NEGATIVE
Nitrite: NEGATIVE
Protein, ur: NEGATIVE
Specific Gravity, Urine: 1.019 (ref 1.001–1.03)
pH: 5.5 (ref 5.0–8.0)

## 2019-10-29 LAB — CBC WITH DIFFERENTIAL/PLATELET
Absolute Monocytes: 448 cells/uL (ref 200–950)
Basophils Absolute: 59 cells/uL (ref 0–200)
Basophils Relative: 1.1 %
Eosinophils Absolute: 92 cells/uL (ref 15–500)
Eosinophils Relative: 1.7 %
HCT: 47.2 % (ref 38.5–50.0)
Hemoglobin: 15.9 g/dL (ref 13.2–17.1)
Lymphs Abs: 1982 cells/uL (ref 850–3900)
MCH: 31.4 pg (ref 27.0–33.0)
MCHC: 33.7 g/dL (ref 32.0–36.0)
MCV: 93.1 fL (ref 80.0–100.0)
MPV: 10.8 fL (ref 7.5–12.5)
Monocytes Relative: 8.3 %
Neutro Abs: 2819 cells/uL (ref 1500–7800)
Neutrophils Relative %: 52.2 %
Platelets: 300 10*3/uL (ref 140–400)
RBC: 5.07 10*6/uL (ref 4.20–5.80)
RDW: 12 % (ref 11.0–15.0)
Total Lymphocyte: 36.7 %
WBC: 5.4 10*3/uL (ref 3.8–10.8)

## 2019-10-29 LAB — COMPLETE METABOLIC PANEL WITH GFR
AG Ratio: 2 (calc) (ref 1.0–2.5)
ALT: 26 U/L (ref 9–46)
AST: 23 U/L (ref 10–35)
Albumin: 5 g/dL (ref 3.6–5.1)
Alkaline phosphatase (APISO): 58 U/L (ref 35–144)
BUN: 13 mg/dL (ref 7–25)
CO2: 25 mmol/L (ref 20–32)
Calcium: 9.8 mg/dL (ref 8.6–10.3)
Chloride: 99 mmol/L (ref 98–110)
Creat: 0.87 mg/dL (ref 0.70–1.33)
GFR, Est African American: 113 mL/min/{1.73_m2} (ref >=60)
GFR, Est Non African American: 97 mL/min/{1.73_m2} (ref >=60)
Globulin: 2.5 g/dL (calc) (ref 1.9–3.7)
Glucose, Bld: 100 mg/dL — ABNORMAL HIGH (ref 65–99)
Potassium: 4.6 mmol/L (ref 3.5–5.3)
Sodium: 135 mmol/L (ref 135–146)
Total Bilirubin: 1 mg/dL (ref 0.2–1.2)
Total Protein: 7.5 g/dL (ref 6.1–8.1)

## 2019-10-29 LAB — MICROALBUMIN / CREATININE URINE RATIO
Creatinine, Urine: 120 mg/dL (ref 20–320)
Microalb Creat Ratio: 8 mcg/mg creat (ref <30)
Microalb, Ur: 1 mg/dL

## 2019-10-29 LAB — LIPID PANEL
Cholesterol: 333 mg/dL — ABNORMAL HIGH (ref <200)
HDL: 56 mg/dL (ref >=40)
LDL Cholesterol (Calc): 221 mg/dL (calc) — ABNORMAL HIGH
Non-HDL Cholesterol (Calc): 277 mg/dL (calc) — ABNORMAL HIGH (ref <130)
Total CHOL/HDL Ratio: 5.9 (calc) — ABNORMAL HIGH (ref <5.0)
Triglycerides: 330 mg/dL — ABNORMAL HIGH (ref <150)

## 2019-10-29 LAB — MAGNESIUM: Magnesium: 2.1 mg/dL (ref 1.5–2.5)

## 2019-10-29 LAB — PSA: PSA: 0.6 ng/mL (ref <=4.0)

## 2019-10-29 LAB — IRON, TOTAL/TOTAL IRON BINDING CAP
%SAT: 42 % (calc) (ref 20–48)
Iron: 162 ug/dL (ref 50–180)
TIBC: 386 mcg/dL (calc) (ref 250–425)

## 2019-10-29 LAB — VITAMIN B12: Vitamin B-12: 442 pg/mL (ref 200–1100)

## 2019-10-29 LAB — TESTOSTERONE: Testosterone: 401 ng/dL (ref 250–827)

## 2019-10-29 LAB — HEMOGLOBIN A1C
Hgb A1c MFr Bld: 5.3 % of total Hgb (ref <5.7)
Mean Plasma Glucose: 105 (calc)
eAG (mmol/L): 5.8 (calc)

## 2019-10-29 LAB — TSH: TSH: 1.52 mIU/L (ref 0.40–4.50)

## 2019-10-29 LAB — VITAMIN D 25 HYDROXY (VIT D DEFICIENCY, FRACTURES): Vit D, 25-Hydroxy: 55 ng/mL (ref 30–100)

## 2019-10-29 LAB — INSULIN, RANDOM: Insulin: 4.4 u[IU]/mL

## 2019-10-29 MED ORDER — LIVALO 2 MG PO TABS: ORAL_TABLET | ORAL | 1 refills | Status: DC

## 2019-10-29 MED ORDER — EZETIMIBE 10 MG PO TABS: ORAL_TABLET | ORAL | 3 refills | Status: DC

## 2019-11-07 ENCOUNTER — Other Ambulatory Visit: Payer: Self-pay | Admitting: Internal Medicine

## 2019-12-30 ENCOUNTER — Other Ambulatory Visit: Payer: Self-pay | Admitting: Adult Health

## 2019-12-30 ENCOUNTER — Telehealth: Payer: Self-pay | Admitting: Adult Health

## 2019-12-30 NOTE — Telephone Encounter (Signed)
Patient called requesting referral for trigger finger, not in network with [facility]

## 2020-02-02 ENCOUNTER — Other Ambulatory Visit: Payer: Self-pay | Admitting: Internal Medicine

## 2020-02-02 ENCOUNTER — Ambulatory Visit: Payer: BC Managed Care – PPO | Admitting: Adult Health

## 2020-02-02 MED ORDER — ALPRAZOLAM 1 MG PO TABS: ORAL_TABLET | ORAL | 0 refills | Status: DC

## 2020-02-08 ENCOUNTER — Other Ambulatory Visit: Payer: Self-pay | Admitting: Internal Medicine

## 2020-02-15 ENCOUNTER — Encounter: Payer: Self-pay | Admitting: Adult Health

## 2020-02-15 NOTE — Progress Notes (Deleted)
FOLLOW UP  Assessment and Plan:   Hypertension Well controlled with current medications  Monitor blood pressure at home; patient to call if consistently greater than 130/80 Continue DASH diet.   Reminder to go to the ER if any CP, SOB, nausea, dizziness, severe HA, changes vision/speech, left arm numbness and tingling and jaw pain.  Cholesterol LDL and total cholesterol remain poorly controlled; he is on zetia 10 mg daily and livalo 2 mg daily *** ; if remains severely elevated will refer to lipid clinic Very resistant to meds ***; discussed risks of untreated cholesterol  Continue low cholesterol diet and exercise.  Check lipid panel.   Other abnormal glucose Recent A1Cs well controlled  Continue diet and exercise.  Perform daily foot/skin check, notify office of any concerning changes.  Defer A1C; check CMP  Obesity - BMI 32 *** Long discussion about weight loss, diet, and exercise Recommended diet heavy in fruits and veggies and low in animal meats, cheeses, and dairy products, appropriate calorie intake Discussed ideal weight for height and  weight goal (200 lb) Will follow up in 3 months  Vitamin D Def At goal at last visit; he did change dose; continue supplementation to maintain goal of 60-100 Check Vit D level  Anxiety Well managed by current regimen; continue medications Stress management techniques discussed, increase water, good sleep hygiene discussed, increase exercise, and increase veggies.   Continue diet and meds as discussed. Further disposition pending results of labs. Discussed med's effects and SE's.   Over 30 minutes of exam, counseling, chart review, and critical decision making was performed.   Future Appointments  Date Time Provider [place]  02/16/2020  8:45 AM [name], NP [facility] None  05/10/2020 10:30 AM Unk Pinto, MD [facility] None  11/06/2020  9:00 AM Unk Pinto, MD [facility] None     ----------------------------------------------------------------------------------------------------------------------  HPI 54 y.o. male  presents for 3 month follow up on hypertension, cholesterol, glucose management, obesity and vitamin D deficiency.  Patient was evaluated  by Dr Brett Fairy and sleep study showed only snoring w/o apnea and he declined f/u or referral for an oral appliance.  He has trigger finger *** was referred to Dr. Mayer Camel in 2019   he has a diagnosis of anxiety and is currently on xanax PRN, reports symptoms are well controlled on current regimen. He takes at bedtime 1-2 times a week at night.   BMI is There is no height or weight on file to calculate BMI., he has been working on diet and exercise, he is very active in his yard/garden.  Wt Readings from Last 3 Encounters:  10/28/19 226 lb (102.5 kg)  07/26/19 225 lb 12.8 oz (102.4 kg)  01/25/19 210 lb (95.3 kg)   His blood pressure has been controlled at home (130s/70-80s), today their BP is    He does workout. He denies chest pain, shortness of breath, dizziness.   He is not on cholesterol medication (on zetia 10 mg daily and livalo 2 mg daily ***). Hx of statin myalgias with other statins.   His cholesterol is not at goal, remains severely elevated. He had cardio IQ panel in 05/2017 which showed LDL particle 2305, LDL small 529, apolipoprotein B of 146. *** family history *** coronary calcium score ***. The cholesterol last visit was:   Lab Results  Component Value Date   CHOL 333 (H) 10/28/2019   HDL 56 10/28/2019   LDLCALC 221 (H) 10/28/2019   TRIG 330 (H) 10/28/2019   CHOLHDL  5.9 (H) 10/28/2019    He has been working on diet and exercise for glucose management, and denies foot ulcerations, increased appetite, nausea, paresthesia of the feet, polydipsia, polyuria, visual disturbances, vomiting and weight loss. Last A1C in the office was:  Lab Results  Component Value Date   HGBA1C 5.3 10/28/2019     Patient is on Vitamin D supplement.   Lab Results  Component Value Date   VD25OH 55 10/28/2019       Current Medications:  Current Outpatient Medications on File Prior to Visit  Medication Sig   ALPRAZolam (XANAX) 1 MG tablet Take 1/2 to 1 tablet 2 to 3 x / day  ONLY if needed for Anxiety Attack & please try to limit to 5 days /week to avoid Addiction & Dementia   cholecalciferol (VITAMIN D) 1000 UNITS tablet Take 5,000 Units by mouth daily.    cyclobenzaprine (FLEXERIL) 10 MG tablet TAKE 1/2 TO 1 (ONE-HALF TO ONE) TABLET BY MOUTH 2-3 TIMES PER DAY AS NEEDED FOR MUSCLE SPASM   ezetimibe (ZETIA) 10 MG tablet Take 1 tablet Daily for Cholesterol   lidocaine-prilocaine (EMLA) cream APPLY TOPICALLY AS NEEDED   lisinopril-hydrochlorothiazide (ZESTORETIC) 20-25 MG tablet Take 1 tablet Daily for BP & Fluid Retention / Ankle Sswelling   meloxicam (MOBIC) 15 MG tablet Take 1/2 to 1 tablet Daily with Food for Pain & Inflammation   montelukast (SINGULAIR) 10 MG tablet TAKE 1 TABLET BY MOUTH ONCE DAILY FOR ALLERGIES AND FOR ASTHMA   Multiple Vitamins-Minerals (MULTIVITAMIN PO) Take by mouth.   omeprazole (PRILOSEC) 40 MG capsule Take 1 capsule Daily for Heartburn & Indigestion   Pitavastatin Calcium (LIVALO) 2 MG TABS Take 1 tablet Daily for Cholesterol   PROCTOZONE-HC 2.5 % rectal cream APPLY  CREAM RECTALLY 4 TIMES DAILY   No current facility-administered medications on file prior to visit.     Allergies:  Allergies  Allergen Reactions   Lipitor [Atorvastatin]     Myalgias   Prozac [Fluoxetine Hcl]    Rosuvastatin     myalgias   Simvastatin     Myalgias     Medical History:  Past Medical History:  Diagnosis Date   Allergy    GERD (gastroesophageal reflux disease)    Hyperlipidemia    Hypertension    IBS (irritable bowel syndrome)    Sleep apnea 12/04/2015   Traumatic pneumothorax 12/04/2015   Vitamin D deficiency    Family history- Reviewed and  unchanged Social history- Reviewed and unchanged   Review of Systems:  Review of Systems  Constitutional: Negative for malaise/fatigue and weight loss.  HENT: Negative for hearing loss and tinnitus.   Eyes: Negative for blurred vision and double vision.  Respiratory: Negative for cough, shortness of breath and wheezing.   Cardiovascular: Negative for chest pain, palpitations, orthopnea, claudication and leg swelling.  Gastrointestinal: Negative for abdominal pain, blood in stool, constipation, diarrhea, heartburn, melena, nausea and vomiting.  Genitourinary: Negative.   Musculoskeletal: Negative for joint pain and myalgias.  Skin: Negative for rash.  Neurological: Negative for dizziness, tingling, sensory change, weakness and headaches.  Endo/Heme/Allergies: Negative for polydipsia.  Psychiatric/Behavioral: Negative.   All other systems reviewed and are negative.    Physical Exam: There were no vitals taken for this visit. Wt Readings from Last 3 Encounters:  10/28/19 226 lb (102.5 kg)  07/26/19 225 lb 12.8 oz (102.4 kg)  01/25/19 210 lb (95.3 kg)   General Appearance: Well nourished, in no apparent distress. Eyes: PERRLA, EOMs, conjunctiva  no swelling or erythema Sinuses: No Frontal/maxillary tenderness ENT/Mouth: Ext aud canals clear, TMs without erythema, bulging. No erythema, swelling, or exudate on post pharynx.  Tonsils not swollen or erythematous. Hearing normal.  Neck: Supple, thyroid normal.  Respiratory: Respiratory effort normal, BS equal bilaterally without rales, rhonchi, wheezing or stridor.  Cardio: RRR with no MRGs. Brisk peripheral pulses without edema.  Abdomen: Soft, + BS.  Non tender, no guarding, rebound, hernias, masses. Lymphatics: Non tender without lymphadenopathy.  Musculoskeletal: Full ROM, 5/5 strength, Normal gait Skin: Warm, dry without rashes, lesions, ecchymosis.  Neuro: Cranial nerves intact. No cerebellar symptoms.  Psych: Awake and oriented  X 3, normal affect, Insight and Judgment appropriate.   Izora Ribas, NP 12:53 PM [facility]

## 2020-02-16 ENCOUNTER — Ambulatory Visit: Payer: BC Managed Care – PPO | Admitting: Adult Health

## 2020-02-22 ENCOUNTER — Other Ambulatory Visit: Payer: Self-pay | Admitting: Physician Assistant

## 2020-02-29 ENCOUNTER — Encounter: Payer: Self-pay | Admitting: Adult Health

## 2020-02-29 NOTE — Progress Notes (Signed)
FOLLOW UP  Assessment and Plan:   Hypertension Well controlled with current medications  Monitor blood pressure at home; patient to call if consistently greater than 130/80 Continue DASH diet.   Reminder to go to the ER if any CP, SOB, nausea, dizziness, severe HA, changes vision/speech, left arm numbness and tingling and jaw pain.  Cholesterol LDL and total cholesterol remain poorly controlled; severe elevated - last LDL 221, discussed coronary calcium study vs lipid clinic; requests proceed with referral  Very resistant to meds; discussed risks of untreated cholesterol including MI, CVA, vascular dementia Continue low cholesterol diet and exercise.  Check lipid panel.   Other abnormal glucose Recent A1Cs well controlled  Continue diet and exercise.  Perform daily foot/skin check, notify office of any concerning changes.  Defer A1C; check CMP  Overweight - BMI 29 Long discussion about weight loss, diet, and exercise Recommended diet heavy in fruits and veggies and low in animal meats, cheeses, and dairy products, appropriate calorie intake Discussed ideal weight for height and  weight goal (200 lb) Will follow up in 3 months  Vitamin D Def Near goal at last visit; continue supplementation to maintain goal of 60-100 Defer Vit D level  Anxiety Well managed by current regimen; continue medications Stress management techniques discussed, increase water, good sleep hygiene discussed, increase exercise, and increase veggies.   R IT band bursitis/tendonitis Continue antiinflammatories, icing discussed Will try tizanidine instead of flexeril for spasms IT band exercises given If no improvement follow up with established ortho   Continue diet and meds as discussed. Further disposition pending results of labs. Discussed med's effects and SE's.   Over 30 minutes of exam, counseling, chart review, and critical decision making was performed.   Future Appointments  Date Time  Provider [place]  05/10/2020 10:30 AM Unk Pinto, MD [facility] None  11/06/2020  9:00 AM Unk Pinto, MD [facility] None    ----------------------------------------------------------------------------------------------------------------------  HPI 55 y.o. male  presents for 3 month follow up on hypertension, cholesterol, glucose management, obesity and vitamin D deficiency.  Patient was evaluated  by Dr Brett Fairy and sleep study showed only snoring w/o apnea and he declined f/u or referral for an oral appliance.  He has trigger finger - R 5th digit-  was referred to Dr. Mayer Camel in 2019, has had injection which helped but needs to follow back up  he has a diagnosis of anxiety and is currently on xanax PRN, reports symptoms are well controlled on current regimen. He takes at bedtime 1-2 times a week at night.   He has some lower back pain, has seen [facility], has MRI with "arthritis"; has been taking tylenol and meloxicam, has had some hip spasms, taking flexeril but this makes him "ill."  BMI is Body mass index is 29.85 kg/m., he has been working on diet and exercise, he is very active in his yard/garden, also physical job. Eating lots of veggies.  Wt Readings from Last 3 Encounters:  03/01/20 (!) 214 lb (97.1 kg)  10/28/19 226 lb (102.5 kg)  07/26/19 225 lb 12.8 oz (102.4 kg)   His blood pressure has been controlled at home (130s/70-80s), today their BP is BP: (!) 128/88  He does workout. He denies chest pain, shortness of breath, dizziness.   He is not on cholesterol medication (was on zetia 10 mg daily and livalo 2 mg daily but reports had severe fatigue, also some leg pain and stopped taking). Hx of statin myalgias with other statins.  His cholesterol is not at goal, remains severely elevated. He had cardio IQ panel in 05/2017 which showed LDL particle 2305, LDL small 529, apolipoprotein B of 146. Family history of hyperlipidemia but no MI/CVA. The cholesterol last  visit was:   Lab Results  Component Value Date   CHOL 333 (H) 10/28/2019   HDL 56 10/28/2019   LDLCALC 221 (H) 10/28/2019   TRIG 330 (H) 10/28/2019   CHOLHDL 5.9 (H) 10/28/2019    He has been working on diet and exercise for glucose management, and denies foot ulcerations, increased appetite, nausea, paresthesia of the feet, polydipsia, polyuria, visual disturbances, vomiting and weight loss. Last A1C in the office was:  Lab Results  Component Value Date   HGBA1C 5.3 10/28/2019    Patient is on Vitamin D supplement.   Lab Results  Component Value Date   VD25OH 55 10/28/2019       Current Medications:  Current Outpatient Medications on File Prior to Visit  Medication Sig  . ALPRAZolam (XANAX) 1 MG tablet Take 1/2 to 1 tablet 2 to 3 x / day  ONLY if needed for Anxiety Attack & please try to limit to 5 days /week to avoid Addiction & Dementia  . cholecalciferol (VITAMIN D) 1000 UNITS tablet Take 5,000 Units by mouth daily.   Marland Kitchen lidocaine-prilocaine (EMLA) cream APPLY TOPICALLY AS NEEDED  . lisinopril-hydrochlorothiazide (ZESTORETIC) 20-25 MG tablet Take 1 tablet Daily for BP & Fluid Retention / Ankle Sswelling  . meloxicam (MOBIC) 15 MG tablet Take 1/2 to 1 tablet Daily with Food for Pain & Inflammation (Patient taking differently: as needed. Take 1/2 to 1 tablet Daily with Food for Pain & Inflammation)  . montelukast (SINGULAIR) 10 MG tablet Take 1 tablet Daily for Allergies & asthma  . Multiple Vitamins-Minerals (MULTIVITAMIN PO) Take by mouth.  Marland Kitchen omeprazole (PRILOSEC) 40 MG capsule Take 1 capsule Daily for Heartburn & Indigestion (Patient taking differently: as needed. Take 1 capsule Daily for Heartburn & Indigestion)  . PROCTOZONE-HC 2.5 % rectal cream APPLY  CREAM RECTALLY 4 TIMES DAILY  . ezetimibe (ZETIA) 10 MG tablet Take 1 tablet Daily for Cholesterol (Patient not taking: Reported on 03/01/2020)  . Pitavastatin Calcium (LIVALO) 2 MG TABS Take 1 tablet Daily for Cholesterol  (Patient not taking: Reported on 03/01/2020)   No current facility-administered medications on file prior to visit.     Allergies:  Allergies  Allergen Reactions  . Lipitor [Atorvastatin]     Myalgias  . Prozac [Fluoxetine Hcl]   . Rosuvastatin     myalgias  . Simvastatin     Myalgias     Medical History:  Past Medical History:  Diagnosis Date  . Allergy   . GERD (gastroesophageal reflux disease)   . Hyperlipidemia   . Hypertension   . IBS (irritable bowel syndrome)   . Traumatic pneumothorax 12/04/2015  . Vitamin D deficiency    Family history- Reviewed and unchanged Social history- Reviewed and unchanged   Review of Systems:  Review of Systems  Constitutional: Negative for malaise/fatigue and weight loss.  HENT: Negative for hearing loss and tinnitus.   Eyes: Negative for blurred vision and double vision.  Respiratory: Negative for cough, shortness of breath and wheezing.   Cardiovascular: Negative for chest pain, palpitations, orthopnea, claudication and leg swelling.  Gastrointestinal: Negative for abdominal pain, blood in stool, constipation, diarrhea, heartburn, melena, nausea and vomiting.  Genitourinary: Negative.   Musculoskeletal: Negative for joint pain and myalgias.  Skin: Negative for rash.  Neurological: Negative for dizziness, tingling, sensory change, weakness and headaches.  Endo/Heme/Allergies: Negative for polydipsia.  Psychiatric/Behavioral: Negative.   All other systems reviewed and are negative.    Physical Exam: BP (!) 128/88   Pulse 81   Temp (!) 97.5 F (36.4 C)   Wt (!) 214 lb (97.1 kg)   SpO2 99%   BMI 29.85 kg/m  Wt Readings from Last 3 Encounters:  03/01/20 (!) 214 lb (97.1 kg)  10/28/19 226 lb (102.5 kg)  07/26/19 225 lb 12.8 oz (102.4 kg)   General Appearance: Well nourished, in no apparent distress. Eyes: PERRLA, EOMs, conjunctiva no swelling or erythema Sinuses: No Frontal/maxillary tenderness ENT/Mouth: Ext aud canals  clear, TMs without erythema, bulging. No erythema, swelling, or exudate on post pharynx.  Tonsils not swollen or erythematous. Hearing normal.  Neck: Supple, thyroid normal.  Respiratory: Respiratory effort normal, BS equal bilaterally without rales, rhonchi, wheezing or stridor.  Cardio: RRR with no MRGs. Brisk peripheral pulses without edema.  Abdomen: Soft, + BS.  Non tender, no guarding, rebound, hernias, masses. Lymphatics: Non tender without lymphadenopathy.  Musculoskeletal: Full ROM, 5/5 strength, Normal gait. R hip greater trochanter tenderness, IT band tenderness Skin: Warm, dry without rashes, lesions, ecchymosis.  Neuro: Cranial nerves intact. No cerebellar symptoms.  Psych: Awake and oriented X 3, normal affect, Insight and Judgment appropriate.   Matthew Ribas, NP 9:54 AM [facility]

## 2020-03-01 ENCOUNTER — Encounter: Payer: Self-pay | Admitting: Adult Health

## 2020-03-01 ENCOUNTER — Ambulatory Visit (INDEPENDENT_AMBULATORY_CARE_PROVIDER_SITE_OTHER): Payer: BC Managed Care – PPO | Admitting: Adult Health

## 2020-03-01 ENCOUNTER — Other Ambulatory Visit: Payer: Self-pay

## 2020-03-01 VITALS — BP 128/88 | HR 81 | Temp 97.5°F | Wt 214.0 lb

## 2020-03-01 DIAGNOSIS — T466X5A Adverse effect of antihyperlipidemic and antiarteriosclerotic drugs, initial encounter: Secondary | ICD-10-CM | POA: Insufficient documentation

## 2020-03-01 DIAGNOSIS — I10 Essential (primary) hypertension: Secondary | ICD-10-CM | POA: Diagnosis not present

## 2020-03-01 DIAGNOSIS — G720 Drug-induced myopathy: Secondary | ICD-10-CM

## 2020-03-01 DIAGNOSIS — Z79899 Other long term (current) drug therapy: Secondary | ICD-10-CM | POA: Diagnosis not present

## 2020-03-01 DIAGNOSIS — E663 Overweight: Secondary | ICD-10-CM

## 2020-03-01 DIAGNOSIS — M7631 Iliotibial band syndrome, right leg: Secondary | ICD-10-CM

## 2020-03-01 DIAGNOSIS — F419 Anxiety disorder, unspecified: Secondary | ICD-10-CM | POA: Diagnosis not present

## 2020-03-01 DIAGNOSIS — E559 Vitamin D deficiency, unspecified: Secondary | ICD-10-CM

## 2020-03-01 DIAGNOSIS — R7309 Other abnormal glucose: Secondary | ICD-10-CM

## 2020-03-01 DIAGNOSIS — E782 Mixed hyperlipidemia: Secondary | ICD-10-CM | POA: Diagnosis not present

## 2020-03-01 MED ORDER — TIZANIDINE HCL 4 MG PO TABS: 2.0000 mg | ORAL_TABLET | Freq: Three times a day (TID) | ORAL | 0 refills | Status: AC | PRN

## 2020-03-01 NOTE — Patient Instructions (Addendum)
Goals    . Weight (lb) < 200 lb (90.7 kg)        Iliotibial Bursitis Rehab Ask your health care provider which exercises are safe for you. Do exercises exactly as told by your health care provider and adjust them as directed. It is normal to feel mild stretching, pulling, tightness, or discomfort as you do these exercises. Stop right away if you feel sudden pain or your pain gets worse. Do not begin these exercises until told by your health care provider. Stretching and range-of-motion exercises These exercises warm up your muscles and joints and improve the movement and flexibility of your leg. These exercises also help to relieve pain and stiffness. Quadriceps stretch, prone  1. Lie on your abdomen (prone position) on a firm surface, such as a bed or padded floor. 2. Bend your left / right knee and reach back to hold your ankle or pant leg. If you cannot reach your ankle or pant leg, loop a belt around your foot and grab the belt instead. 3. Gently pull your heel toward your buttocks. Your knee should not slide out to the side. You should feel a stretch in the front of your thigh and knee (quadriceps). 4. Hold this position for __________ seconds. Repeat __________ times. Complete this exercise __________ times a day. Lunge This exercise stretches the muscle in the inner thigh (adductor). 1. Stand and spread your legs about 3 ft (1 m) apart. Put your left / right leg slightly back for balance. 2. Lean away from your left / right leg by bending your other knee and shifting your weight toward your bent knee. You may rest your hands on your thigh for balance. You should feel a stretch in your left / right inner thigh. 3. Hold this position for __________ seconds. Repeat __________ times. Complete this exercise __________ times a day. Hamstring stretch, supine  1. Lie on your back (supine position). 2. Loop a belt or towel over the ball of your left / right foot. The ball of your foot is on  the walking surface, right under your toes. 3. Straighten your left / right knee and slowly pull on the belt or towel to raise your leg. Stop when you feel a gentle stretch in the back of your left / right knee or thigh (hamstrings). ? Do not let your left / right knee bend. ? Keep your other leg flat on the floor. 4. Hold this position for __________ seconds. Repeat __________ times. Complete this exercise __________ times a day. Strengthening exercises These exercises build strength and endurance in your leg. Endurance is the ability to use your muscles for a long time, even after they get tired. Wall slides This exercise strengthens the muscles in the front of your thigh and knee (quadriceps). 1. Lean your back against a smooth wall or door, and walk your feet out 18-24 inches (46-61 cm) from it. 2. Place your feet hip-width apart. 3. Slowly slide down the wall or door until your knees bend as far as told by your health care provider. Keep your knees over your heels, not your toes. Keep your knees in line with your hips. 4. Hold this position for __________ seconds. 5. Use the muscles in the front of your thigh to push yourself up to the standing position. 6. Rest for __________ seconds after each repetition. Repeat __________ times. Complete this exercise __________ times a day. Straight leg raises, side-lying This exercise is sometimes called a hip abductor exercise.  It strengthens the muscles that rotate the leg at the hip and move it away from your body (hip abductors). 1. Lie on your side with your left / right leg in the top position. Lie so your head, shoulder, hip, and knee line up. Bend your bottom knee slightly to help you balance. 2. Lift your top leg 4-6 inches (10-15 cm) while keeping your toes pointed straight ahead. 3. Hold this position for __________ seconds. 4. Slowly lower your leg to the starting position. 5. Let your muscles relax completely after each  repetition. Repeat __________ times. Complete this exercise __________ times a day. Straight leg raises, prone This exercise strengthens the muscles that move the hips (hip extensors). 1. Lie on your abdomen (prone position) on a firm surface. You can put a pillow under your hips if that is more comfortable for your lower back. 2. Squeeze your buttocks muscles and lift your left / right leg about 4-6 inches (10-15 cm). Keep your knee straight as you lift your leg. 3. Hold this position for __________ seconds. 4. Slowly lower your leg to the starting position. 5. Let your muscles relax completely after each repetition. Repeat __________ times. Complete this exercise __________ times a day. Bridge This exercise strengthens the muscles that move the hips (hip extensors). 1. Lie on your back on a firm surface with your knees bent and your feet flat on the floor. 2. Tighten your buttocks muscles and lift your bottom off the floor until the trunk of your body is level with your thighs. ? Do not arch your back. ? You should feel the muscles working in your buttocks and the back of your thighs. If you do not feel these muscles, slide your feet 1-2 inches (2.5-5 cm) farther away from your buttocks. 3. Hold this position for __________ seconds. 4. Slowly lower your hips to the starting position. 5. Let your muscles relax completely after each repetition. 6. If this exercise is too easy, try doing it with your arms crossed over your chest. Repeat __________ times. Complete this exercise __________ times a day. This information is not intended to replace advice given to you by your health care provider. Make sure you discuss any questions you have with your health care provider. Document Revised: 11/12/2018 Document Reviewed: 09/28/2018 Elsevier Patient Education  [place].      Tizanidine tablets or capsules What is this medicine? TIZANIDINE (tye ZAN i deen) helps to relieve muscle  spasms. It may be used to help in the treatment of multiple sclerosis and spinal cord injury. This medicine may be used for other purposes; ask your health care provider or pharmacist if you have questions. COMMON BRAND NAME(S): Zanaflex What should I tell my health care provider before I take this medicine? They need to know if you have any of these conditions:  kidney disease  liver disease  low blood pressure  mental disorder  an unusual or allergic reaction to tizanidine, other medicines, lactose (tablets only), foods, dyes, or preservatives  pregnant or trying to get pregnant  breast-feeding How should I use this medicine? Take this medicine by mouth with a full glass of water. Take this medicine on an empty stomach, at least 30 minutes before or 2 hours after food. Do not take with food unless you talk with your doctor. Follow the directions on the prescription label. Take your medicine at regular intervals. Do not take your medicine more often than directed. Do not stop taking except on your  doctor's advice. Suddenly stopping the medicine can be very dangerous. Talk to your pediatrician regarding the use of this medicine in children. Patients over 104 years old may have a stronger reaction and need a smaller dose. Overdosage: If you think you have taken too much of this medicine contact a poison control center or emergency room at once. NOTE: This medicine is only for you. Do not share this medicine with others. What if I miss a dose? If you miss a dose, take it as soon as you can. If it is almost time for your next dose, take only that dose. Do not take double or extra doses. What may interact with this medicine? Do not take this medicine with any of the following medications:  ciprofloxacin  fluvoxamine  narcotic medicines for cough  thiabendazole This medicine may also interact with the following medications:  acyclovir  alcohol  antihistamines for allergy, cough,  and cold  baclofen  certain medicines for anxiety or sleep  certain medicines for blood pressure, heart disease, irregular heartbeat  certain medicines for depression like amitriptyline, fluoxetine, sertraline  certain medicines for seizures like phenobarbital, primidone  certain medicines for stomach problems like cimetidine, famotidine  male hormones, like estrogens or progestins and birth control pills, patches, rings, or injections  general anesthetics like halothane, isoflurane, methoxyflurane, propofol  local anesthetics like lidocaine, pramoxine, tetracaine  medicines that relax muscles for surgery  narcotic medicines for pain  phenothiazines like chlorpromazine, mesoridazine, prochlorperazine  ticlopidine  zileuton This list may not describe all possible interactions. Give your health care provider a list of all the medicines, herbs, non-prescription drugs, or dietary supplements you use. Also tell them if you smoke, drink alcohol, or use illegal drugs. Some items may interact with your medicine. What should I watch for while using this medicine? Tell your doctor or health care professional if your symptoms do not start to get better or if they get worse. You may get drowsy or dizzy. Do not drive, use machinery, or do anything that needs mental alertness until you know how this medicine affects you. Do not stand or sit up quickly, especially if you are an older patient. This reduces the risk of dizzy or fainting spells. Alcohol may interfere with the effect of this medicine. Avoid alcoholic drinks. If you are taking another medicine that also causes drowsiness, you may have more side effects. Give your health care provider a list of all medicines you use. Your doctor will tell you how much medicine to take. Do not take more medicine than directed. Call emergency for help if you have problems breathing or unusual sleepiness. Your mouth may get dry. Chewing sugarless gum or  sucking hard candy, and drinking plenty of water may help. Contact your doctor if the problem does not go away or is severe. What side effects may I notice from receiving this medicine? Side effects that you should report to your doctor or health care professional as soon as possible:  allergic reactions like skin rash, itching or hives, swelling of the face, lips, or tongue  breathing problems  hallucinations  signs and symptoms of liver injury like dark yellow or brown urine; general ill feeling or flu-like symptoms; light-colored stools; loss of appetite; nausea; right upper quadrant belly pain; unusually weak or tired; yellowing of the eyes or skin  signs and symptoms of low blood pressure like dizziness; feeling faint or lightheaded, falls; unusually weak or tired  unusually slow heartbeat  unusually weak or tired  Side effects that usually do not require medical attention (report to your doctor or health care professional if they continue or are bothersome):  blurred vision  constipation  dizziness  dry mouth  tiredness This list may not describe all possible side effects. Call your doctor for medical advice about side effects. You may report side effects to FDA at 1-800-FDA-1088. Where should I keep my medicine? Keep out of the reach of children. Store at room temperature between 15 and 30 degrees C (59 and 86 degrees F). Throw away any unused medicine after the expiration date. NOTE: This sheet is a summary. It may not cover all possible information. If you have questions about this medicine, talk to your doctor, pharmacist, or health care provider.  2020 Elsevier/Gold Standard (2017-05-06 13:33:29)

## 2020-03-02 LAB — COMPLETE METABOLIC PANEL WITH GFR
AG Ratio: 1.7 (calc) (ref 1.0–2.5)
ALT: 25 U/L (ref 9–46)
AST: 28 U/L (ref 10–35)
Albumin: 4.8 g/dL (ref 3.6–5.1)
Alkaline phosphatase (APISO): 58 U/L (ref 35–144)
BUN: 11 mg/dL (ref 7–25)
CO2: 25 mmol/L (ref 20–32)
Calcium: 10 mg/dL (ref 8.6–10.3)
Chloride: 101 mmol/L (ref 98–110)
Creat: 0.94 mg/dL (ref 0.70–1.33)
GFR, Est African American: 105 mL/min/{1.73_m2} (ref >=60)
GFR, Est Non African American: 90 mL/min/{1.73_m2} (ref >=60)
Globulin: 2.9 g/dL (calc) (ref 1.9–3.7)
Glucose, Bld: 100 mg/dL — ABNORMAL HIGH (ref 65–99)
Potassium: 5 mmol/L (ref 3.5–5.3)
Sodium: 137 mmol/L (ref 135–146)
Total Bilirubin: 0.7 mg/dL (ref 0.2–1.2)
Total Protein: 7.7 g/dL (ref 6.1–8.1)

## 2020-03-02 LAB — LIPID PANEL
Cholesterol: 301 mg/dL — ABNORMAL HIGH (ref <200)
HDL: 61 mg/dL (ref >=40)
LDL Cholesterol (Calc): 179 mg/dL (calc) — ABNORMAL HIGH
Non-HDL Cholesterol (Calc): 240 mg/dL (calc) — ABNORMAL HIGH (ref <130)
Total CHOL/HDL Ratio: 4.9 (calc) (ref <5.0)
Triglycerides: 361 mg/dL — ABNORMAL HIGH (ref <150)

## 2020-03-02 LAB — CBC WITH DIFFERENTIAL/PLATELET
Absolute Monocytes: 496 cells/uL (ref 200–950)
Basophils Absolute: 59 cells/uL (ref 0–200)
Basophils Relative: 1 %
Eosinophils Absolute: 59 cells/uL (ref 15–500)
Eosinophils Relative: 1 %
HCT: 45.9 % (ref 38.5–50.0)
Hemoglobin: 15.3 g/dL (ref 13.2–17.1)
Lymphs Abs: 1534 cells/uL (ref 850–3900)
MCH: 31.7 pg (ref 27.0–33.0)
MCHC: 33.3 g/dL (ref 32.0–36.0)
MCV: 95.2 fL (ref 80.0–100.0)
MPV: 11 fL (ref 7.5–12.5)
Monocytes Relative: 8.4 %
Neutro Abs: 3752 cells/uL (ref 1500–7800)
Neutrophils Relative %: 63.6 %
Platelets: 317 10*3/uL (ref 140–400)
RBC: 4.82 10*6/uL (ref 4.20–5.80)
RDW: 11.7 % (ref 11.0–15.0)
Total Lymphocyte: 26 %
WBC: 5.9 10*3/uL (ref 3.8–10.8)

## 2020-03-02 LAB — MAGNESIUM: Magnesium: 2 mg/dL (ref 1.5–2.5)

## 2020-03-02 LAB — TSH: TSH: 1.46 mIU/L (ref 0.40–4.50)

## 2020-04-05 ENCOUNTER — Encounter: Payer: Self-pay | Admitting: General Practice

## 2020-04-08 ENCOUNTER — Other Ambulatory Visit: Payer: Self-pay | Admitting: Internal Medicine

## 2020-04-11 ENCOUNTER — Other Ambulatory Visit: Payer: Self-pay | Admitting: Internal Medicine

## 2020-04-26 ENCOUNTER — Other Ambulatory Visit: Payer: Self-pay

## 2020-04-26 ENCOUNTER — Encounter: Payer: Self-pay | Admitting: Physician Assistant

## 2020-04-26 ENCOUNTER — Ambulatory Visit: Payer: BC Managed Care – PPO | Admitting: Physician Assistant

## 2020-04-26 VITALS — BP 128/88 | HR 91 | Temp 97.1°F | Wt 223.0 lb

## 2020-04-26 DIAGNOSIS — H00015 Hordeolum externum left lower eyelid: Secondary | ICD-10-CM | POA: Diagnosis not present

## 2020-04-26 MED ORDER — CIPROFLOXACIN HCL 0.3 % OP SOLN: OPHTHALMIC | 0 refills | Status: DC

## 2020-04-26 NOTE — Progress Notes (Signed)
° °  Subjective:    Patient ID: Matthew Salinas, male    DOB: 01-22-1966, 54 y.o.   MRN: [id number]  HPI 54 y.o. WM presents with left eye irritation/eye pain x 2-3 days. Wears glasses, no contacts, eye doctor out of town until Monday.  No known injury. His eyes were itchy, but then he started to have left outer corner irritation/pain, burning sensation.  He has tried to flush out his eye, he has been using olopatine 0.2% drops.  No fever, chills, some watering from the eye but no discharge. Has some blurry vision. He has a headache.    Review of Systems     Objective:   Physical Exam Constitutional:      General: He is not in acute distress.    Appearance: Normal appearance. He is not ill-appearing.  HENT:     Right Ear: Tympanic membrane, ear canal and external ear normal.     Left Ear: Tympanic membrane, ear canal and external ear normal.     Nose: Nose normal. No rhinorrhea.     Mouth/Throat:     Mouth: Mucous membranes are dry.     Pharynx: Oropharynx is clear. No oropharyngeal exudate or posterior oropharyngeal erythema.     Comments: Unchanged large tonsils Eyes:     General: Vision grossly intact. Gaze aligned appropriately.        Left eye: Hordeolum (small left lower lid stye at outer corner with erythema, no discharge) present.No foreign body or discharge.     Extraocular Movements: Extraocular movements intact.     Right eye: Normal extraocular motion.     Left eye: Normal extraocular motion.     Conjunctiva/sclera:     Right eye: Right conjunctiva is not injected. No chemosis, exudate or hemorrhage.    Left eye: Left conjunctiva is not injected. No chemosis, exudate or hemorrhage.    Pupils: Pupils are equal, round, and reactive to light.  Cardiovascular:     Pulses: Normal pulses.     Heart sounds: Normal heart sounds.  Pulmonary:     Effort: Pulmonary effort is normal.     Breath sounds: Normal breath sounds.  Neurological:     Mental Status: He is alert.         Assessment & Plan:   Left lower lid Stye Discussed symptoms and exam findings.     Wash your hands often, Avoid touching your eyelid.  Apply heat/moist warm compresses to your eyelid for 10 to 20 minutes, 3-5 x a day. Advised not to squeeze the sty. Allow it to drain on its own.  Follow up with eye doctor if not improved. Call the office if symptoms are not improving or go to the ER if any worsening symptoms like severe fever, loss of vision, severe pain in eye, severe headache, severe neck pain with stiffness.

## 2020-04-26 NOTE — Patient Instructions (Addendum)
Stye  Apply a warm, wet cloth (warm compress) to your eye for 5-10 minutes, 4 times a day. Use the cipro drops Use artifical tears in between  A stye, also known as a hordeolum, is a bump that forms on an eyelid. It may look like a pimple next to the eyelash. A stye can form inside the eyelid (internal stye) or outside the eyelid (external stye). A stye can cause redness, swelling, and pain on the eyelid. Styes are very common. Anyone can get them at any age. They usually occur in just one eye, but you may have more than one in either eye. What are the causes? A stye is caused by an infection. The infection is almost always caused by bacteria called Staphylococcus aureus. This is a common type of bacteria that lives on the skin. An internal stye may result from an infected oil-producing gland inside the eyelid. An external stye may be caused by an infection at the base of the eyelash (hair follicle). What increases the risk? You are more likely to develop a stye if:  You have had a stye before.  You have any of these conditions: ? Diabetes. ? Red, itchy, inflamed eyelids (blepharitis). ? A skin condition such as seborrheic dermatitis or rosacea. ? High fat levels in your blood (lipids). What are the signs or symptoms? The most common symptom of a stye is eyelid pain. Internal styes are more painful than external styes. Other symptoms may include:  Painful swelling of your eyelid.  A scratchy feeling in your eye.  Tearing and redness of your eye.  Pus draining from the stye. How is this diagnosed? Your health care provider may be able to diagnose a stye just by examining your eye. The health care provider may also check to make sure:  You do not have a fever or other signs of a more serious infection.  The infection has not spread to other parts of your eye or areas around your eye. How is this treated? Most styes will clear up in a few days without treatment or with warm  compresses applied to the area. You may need to use antibiotic drops or ointment to treat an infection. In some cases, if your stye does not heal with routine treatment, your health care provider may drain pus from the stye using a thin blade or needle. This may be done if the stye is large, causing a lot of pain, or affecting your vision. Follow these instructions at home:  Take over-the-counter and prescription medicines only as told by your health care provider. This includes eye drops or ointments.  If you were prescribed an antibiotic medicine, apply or use it as told by your health care provider. Do not stop using the antibiotic even if your condition improves.  Apply a warm, wet cloth (warm compress) to your eye for 5-10 minutes, 4 times a day.  Clean the affected eyelid as directed by your health care provider.  Do not wear contact lenses or eye makeup until your stye has healed.  Do not try to pop or drain the stye.  Do not rub your eye. Contact a health care provider if:  You have chills or a fever.  Your stye does not go away after several days.  Your stye affects your vision.  Your eyeball becomes swollen, red, or painful. Get help right away if:  You have pain when moving your eye around. Summary  A stye is a bump that forms on  an eyelid. It may look like a pimple next to the eyelash.  A stye can form inside the eyelid (internal stye) or outside the eyelid (external stye). A stye can cause redness, swelling, and pain on the eyelid.  Your health care provider may be able to diagnose a stye just by examining your eye.  Apply a warm, wet cloth (warm compress) to your eye for 5-10 minutes, 4 times a day. This information is not intended to replace advice given to you by your health care provider. Make sure you discuss any questions you have with your health care provider. Document Revised: 07/04/2017 Document Reviewed: 04/03/2017 Elsevier Patient Education  2020  [employer].

## 2020-05-10 ENCOUNTER — Ambulatory Visit: Payer: BC Managed Care – PPO | Admitting: Internal Medicine

## 2020-06-11 ENCOUNTER — Encounter: Payer: Self-pay | Admitting: Internal Medicine

## 2020-06-11 NOTE — Progress Notes (Addendum)
History of Present Illness:       This very nice 54 y.o. [name] presents for 6 month follow up with HTN, HLD, Pre-Diabetes and Vitamin D Deficiency.       Patient is treated for HTN & BP has been controlled at home. Today's BP is at goal -  124/80. Patient has had no complaints of any cardiac type chest pain, palpitations, dyspnea / orthopnea / PND, dizziness, claudication, or dependent edema.      Hyperlipidemia is not controlled with diet & he has failed to fill Rx's for Livalo & Zetia. Patient denies myalgias or other med SE's. Last Lipids were not at goal:  Lab Results  Component Value Date   CHOL 301 (H) 03/01/2020   HDL 61 03/01/2020   LDLCALC 179 (H) 03/01/2020   TRIG 361 (H) 03/01/2020   CHOLHDL 4.9 03/01/2020    Also, the patient is followed expectantly for glucose Intolerance  and has had no symptoms of reactive hypoglycemia, diabetic polys, paresthesias or visual blurring.  Last A1c was Normal & at goal:  Lab Results  Component Value Date   HGBA1C 5.3 10/28/2019       Further, the patient also has history of Vitamin D Deficiency ("34" /2016) and supplements Vitamin D without any suspected side-effects. Last vitamin D was not at goal:  Lab Results  Component Value Date   VD25OH 55 10/28/2019    Current Outpatient Medications on File Prior to Visit  Medication Sig  . ALPRAZolam 1 MG t Take 1/2 to 1 tablet 2 to 3 x / day  ONLY if needed for Anxiety Attack & please try to limit to 5 days /week to avoid Addiction & Dementia  . VITAMIN D 1000 U Take 5,000 Units by mouth daily.   Marland Kitchen lisinopril-hctz 20-25 MG  Take 1 tablet     Daily     for BP & Fluid Retention / Ankle Swelling  . meloxicam 15 MG  Take 1/2 to 1 tablet Daily with Food for Pain   . montelukast  10 MG  Take 1 tablet Daily for Allergies & asthma  . Multi-Vitw/Min Take by mouth.  Marland Kitchen omeprazole  40 MG  Take 1 capsule Daily  . PROCTOZONE-HC 2.5 % rect crm APPLY  CR RECTALLY 4 x DAILY  . tiZANidine  4 MG   Take 0.5-1 tablets every 8  hours as needed     Allergies  Allergen Reactions  . Lipitor [Atorvastatin]     Myalgias  . Prozac [Fluoxetine Hcl]   . Rosuvastatin     myalgias  . Simvastatin     Myalgias    PMHx:   Past Medical History:  Diagnosis Date  . Allergy   . GERD (gastroesophageal reflux disease)   . Hyperlipidemia   . Hypertension   . IBS (irritable bowel syndrome)   . Traumatic pneumothorax 12/04/2015  . Vitamin D deficiency     Immunization History  Administered Date(s) Administered  . PPD Test 01/19/2014, 03/27/2016, 04/28/2017, 06/29/2018, 10/28/2019  . Pneumococcal-Unspecified 01/06/2012  . Td 06/10/2006  . Tdap 03/27/2016    Past Surgical History:  Procedure Laterality Date  . CHEST TUBE INSERTION Right 05-15-02   thoracostomy  . TOE SURGERY  2008   right big toe    FHx:    Reviewed / unchanged  SHx:    Reviewed / unchanged   Systems Review:  Constitutional: Denies fever, chills, wt changes, headaches, insomnia, fatigue, night sweats, change  in appetite. Eyes: Denies redness, blurred vision, diplopia, discharge, itchy, watery eyes.  ENT: Denies discharge, congestion, post nasal drip, epistaxis, sore throat, earache, hearing loss, dental pain, tinnitus, vertigo, sinus pain, snoring.  CV: Denies chest pain, palpitations, irregular heartbeat, syncope, dyspnea, diaphoresis, orthopnea, PND, claudication or edema. Respiratory: denies cough, dyspnea, DOE, pleurisy, hoarseness, laryngitis, wheezing.  Gastrointestinal: Denies dysphagia, odynophagia, heartburn, reflux, water brash, abdominal pain or cramps, nausea, vomiting, bloating, diarrhea, constipation, hematemesis, melena, hematochezia  or hemorrhoids. Genitourinary: Denies dysuria, frequency, urgency, nocturia, hesitancy, discharge, hematuria or flank pain. Musculoskeletal: Denies arthralgias, myalgias, stiffness, jt. swelling, pain, limping or strain/sprain.  Skin: Denies pruritus, rash, hives, warts,  acne, eczema or change in skin lesion(s). Neuro: No weakness, tremor, incoordination, spasms, paresthesia or pain. Psychiatric: Denies confusion, memory loss or sensory loss. Endo: Denies change in weight, skin or hair change.  Heme/Lymph: No excessive bleeding, bruising or enlarged lymph nodes.  Physical Exam  BP 124/80   Pulse 88   Temp (!) 97.2 F (36.2 C)   Resp 16   Ht 5\' 11"  (1.803 m)   Wt 223 lb (101.2 kg)   SpO2 99%   BMI 31.10 kg/m   Appears  well nourished, well groomed  and in no distress.  Eyes: PERRLA, EOMs, conjunctiva no swelling or erythema. Sinuses: No frontal/maxillary tenderness ENT/Mouth: EAC's clear, TM's nl w/o erythema, bulging. Nares clear w/o erythema, swelling, exudates. Oropharynx clear without erythema or exudates. Oral hygiene is good. Tongue normal, non obstructing. Hearing intact.  Neck: Supple. Thyroid not palpable. Car 2+/2+ without bruits, nodes or JVD. Chest: Respirations nl with BS clear & equal w/o rales, rhonchi, wheezing or stridor.  Cor: Heart sounds normal w/ regular rate and rhythm without sig. murmurs, gallops, clicks or rubs. Peripheral pulses normal and equal  without edema.  Abdomen: Soft & bowel sounds normal. Non-tender w/o guarding, rebound, hernias, masses or organomegaly.  Lymphatics: Unremarkable.  Musculoskeletal: Full ROM all peripheral extremities, joint stability, 5/5 strength and normal gait.  Skin: Warm, dry without exposed rashes, lesions or ecchymosis apparent.  Neuro: Cranial nerves intact, reflexes equal bilaterally. Sensory-motor testing grossly intact. Tendon reflexes grossly intact.  Pysch: Alert & oriented x 3.  Insight and judgement nl & appropriate. No ideations.  Assessment and Plan:  1. Essential hypertension  - Continue medication, monitor blood pressure at home.  - Continue DASH diet.  Reminder to go to the ER if any CP,  SOB, nausea, dizziness, severe HA, changes vision/speech.  - CBC with  Differential/Platelet - COMPLETE METABOLIC PANEL WITH GFR - Magnesium - TSH  2. Hyperlipidemia, mixed  - Continue diet/meds, exercise,& lifestyle modifications.  - Continue monitor periodic cholesterol/liver & renal functions   - Lipid panel - TSH  3. Abnormal glucose  - Continue diet, exercise  - Lifestyle modifications.  - Monitor appropriate labs  - Hemoglobin A1c - Insulin, random  4. Vitamin D deficiency  - Continue supplementation.  - VITAMIN D 25 Hydroxy  5. Medication management  - CBC with Differential/Platelet - COMPLETE METABOLIC PANEL WITH GFR - Magnesium - Lipid panel - TSH - Hemoglobin A1c - Insulin, random - VITAMIN D 25 Hydroxy       Discussed  regular exercise, BP monitoring, weight control to achieve/maintain BMI less than 25 and discussed med and SE's. Recommended labs to assess and monitor clinical status with further disposition pending results of labs.  I discussed the assessment and treatment plan with the patient. The patient was provided an opportunity to ask questions  and all were answered. The patient agreed with the plan and demonstrated an understanding of the instructions.  I provided over 30 minutes of exam, counseling, chart review and  complex critical decision making.         The patient was advised to call back or seek an in-person evaluation if the symptoms worsen or if the condition fails to improve as anticipated.   Kirtland Bouchard, MD

## 2020-06-11 NOTE — Patient Instructions (Signed)

## 2020-06-12 ENCOUNTER — Ambulatory Visit (INDEPENDENT_AMBULATORY_CARE_PROVIDER_SITE_OTHER): Payer: BC Managed Care – PPO | Admitting: Internal Medicine

## 2020-06-12 ENCOUNTER — Other Ambulatory Visit: Payer: Self-pay | Admitting: *Deleted

## 2020-06-12 ENCOUNTER — Other Ambulatory Visit: Payer: Self-pay

## 2020-06-12 VITALS — BP 124/80 | HR 88 | Temp 97.2°F | Resp 16 | Ht 71.0 in | Wt 223.0 lb

## 2020-06-12 DIAGNOSIS — E782 Mixed hyperlipidemia: Secondary | ICD-10-CM | POA: Diagnosis not present

## 2020-06-12 DIAGNOSIS — E559 Vitamin D deficiency, unspecified: Secondary | ICD-10-CM

## 2020-06-12 DIAGNOSIS — R7309 Other abnormal glucose: Secondary | ICD-10-CM | POA: Diagnosis not present

## 2020-06-12 DIAGNOSIS — Z79899 Other long term (current) drug therapy: Secondary | ICD-10-CM

## 2020-06-12 DIAGNOSIS — I10 Essential (primary) hypertension: Secondary | ICD-10-CM | POA: Diagnosis not present

## 2020-06-12 MED ORDER — ALPRAZOLAM 1 MG PO TABS
ORAL_TABLET | ORAL | 0 refills | Status: DC
Start: 2020-06-12 — End: 2020-09-28

## 2020-06-12 MED ORDER — MONTELUKAST SODIUM 10 MG PO TABS
ORAL_TABLET | ORAL | 0 refills | Status: DC
Start: 2020-06-12 — End: 2021-03-11

## 2020-06-13 LAB — LIPID PANEL
Cholesterol: 312 mg/dL — ABNORMAL HIGH (ref <200)
HDL: 57 mg/dL (ref >=40)
LDL Cholesterol (Calc): 203 mg/dL (calc) — ABNORMAL HIGH
Non-HDL Cholesterol (Calc): 255 mg/dL (calc) — ABNORMAL HIGH (ref <130)
Total CHOL/HDL Ratio: 5.5 (calc) — ABNORMAL HIGH (ref <5.0)
Triglycerides: 306 mg/dL — ABNORMAL HIGH (ref <150)

## 2020-06-13 LAB — COMPLETE METABOLIC PANEL WITH GFR
AG Ratio: 1.8 (calc) (ref 1.0–2.5)
ALT: 23 U/L (ref 9–46)
AST: 24 U/L (ref 10–35)
Albumin: 4.7 g/dL (ref 3.6–5.1)
Alkaline phosphatase (APISO): 54 U/L (ref 35–144)
BUN: 11 mg/dL (ref 7–25)
CO2: 27 mmol/L (ref 20–32)
Calcium: 9.6 mg/dL (ref 8.6–10.3)
Chloride: 101 mmol/L (ref 98–110)
Creat: 0.82 mg/dL (ref 0.70–1.33)
GFR, Est African American: 115 mL/min/{1.73_m2} (ref >=60)
GFR, Est Non African American: 99 mL/min/{1.73_m2} (ref >=60)
Globulin: 2.6 g/dL (calc) (ref 1.9–3.7)
Glucose, Bld: 105 mg/dL — ABNORMAL HIGH (ref 65–99)
Potassium: 4.7 mmol/L (ref 3.5–5.3)
Sodium: 137 mmol/L (ref 135–146)
Total Bilirubin: 0.7 mg/dL (ref 0.2–1.2)
Total Protein: 7.3 g/dL (ref 6.1–8.1)

## 2020-06-13 LAB — CBC WITH DIFFERENTIAL/PLATELET
Absolute Monocytes: 442 cells/uL (ref 200–950)
Basophils Absolute: 53 cells/uL (ref 0–200)
Basophils Relative: 1.1 %
Eosinophils Absolute: 91 cells/uL (ref 15–500)
Eosinophils Relative: 1.9 %
HCT: 43.8 % (ref 38.5–50.0)
Hemoglobin: 14.8 g/dL (ref 13.2–17.1)
Lymphs Abs: 1229 cells/uL (ref 850–3900)
MCH: 31.8 pg (ref 27.0–33.0)
MCHC: 33.8 g/dL (ref 32.0–36.0)
MCV: 94 fL (ref 80.0–100.0)
MPV: 10.5 fL (ref 7.5–12.5)
Monocytes Relative: 9.2 %
Neutro Abs: 2986 cells/uL (ref 1500–7800)
Neutrophils Relative %: 62.2 %
Platelets: 282 10*3/uL (ref 140–400)
RBC: 4.66 10*6/uL (ref 4.20–5.80)
RDW: 11.7 % (ref 11.0–15.0)
Total Lymphocyte: 25.6 %
WBC: 4.8 10*3/uL (ref 3.8–10.8)

## 2020-06-13 LAB — HEMOGLOBIN A1C
Hgb A1c MFr Bld: 5.4 % of total Hgb (ref <5.7)
Mean Plasma Glucose: 108 (calc)
eAG (mmol/L): 6 (calc)

## 2020-06-13 LAB — MAGNESIUM: Magnesium: 2 mg/dL (ref 1.5–2.5)

## 2020-06-13 LAB — TSH: TSH: 1.38 mIU/L (ref 0.40–4.50)

## 2020-06-13 LAB — VITAMIN D 25 HYDROXY (VIT D DEFICIENCY, FRACTURES): Vit D, 25-Hydroxy: 67 ng/mL (ref 30–100)

## 2020-06-13 LAB — INSULIN, RANDOM: Insulin: 5.2 u[IU]/mL

## 2020-06-13 NOTE — Progress Notes (Signed)
==========================================================  ==========================================================  -    Total Chol = 312 - Dangerously High ! (Ideal or Goal is less than 180)   - and LDL Chol = 203 is even Worse ! ! !   $$$$$$$$$$$$$$$$$$$$$$$$$$$$$$$$$$$$$$$$$$$$$$$$$$$$$$$$$$$$  - Strongly encourage you to take meds for your Cholesterol and Prevent  Heart Attack, Stroke, Vascular Dementia, Impotence, Kidney Failure and  Gangrene & loss of a leg   $$$$$$$$$$$$$$$$$$$$$$$$$$$$$$$$$$$$$$$$$$$$$$$$$$$$$$$$$$$$$  Also Triglycerides (   306   ) or fats in blood are also way  too high  (goal is less than 150)    - Recommend avoid fried & greasy foods,  sweets / candy,   - Avoid white rice  (brown or wild rice or Quinoa is OK),   - Avoid white potatoes  (sweet potatoes are OK)   - Avoid anything made from white flour  - bagels, doughnuts, rolls, buns, biscuits, white and   wheat breads, pizza crust and traditional  pasta made of white flour & egg white  - (vegetarian pasta or spinach or wheat pasta is OK).    - Multi-grain bread is OK - like multi-grain flat bread or  sandwich thins.   - Avoid alcohol in excess.   - Exercise is also important. ========================================================== ==========================================================  - Recommend a stricter low cholesterol diet   - Cholesterol only comes from animal sources  - ie. meat, dairy, egg yolks  - Eat all the vegetables you want.  - Avoid meat, especially red meat - Beef AND Pork .  - Avoid cheese & dairy - milk & ice cream.     - Cheese is the most concentrated form of trans-fats which  is the worst thing to clog up our arteries.   - Veggie cheese is OK which can be found in the fresh  produce section at [employer] or [employer] or [employer] ==========================================================  -  A1c - Normal - Great - No  Diabetes ==========================================================  -  Vitamin D = 67 - Great  ==========================================================  -  All Else - CBC - Kidneys - Electrolytes - Liver - Magnesium & Thyroid    - all  Normal / OK ====================================================

## 2020-06-14 ENCOUNTER — Other Ambulatory Visit: Payer: Self-pay | Admitting: Internal Medicine

## 2020-06-14 MED ORDER — SIMVASTATIN 20 MG PO TABS: ORAL_TABLET | ORAL | 0 refills | Status: DC

## 2020-06-14 NOTE — Progress Notes (Signed)
Patient is aware of lab results. He states that he has a friend that is on Simvastatin and has no side effects. Would it be okay for him to try that for his cholesterol? Also, Caryl Pina had referred him to Dr. Debara Pickett in September for a lipid disorder test but he never could get in touch with them to schedule. Could we get him set up for this again? -Matthew Salinas

## 2020-06-28 ENCOUNTER — Other Ambulatory Visit: Payer: Self-pay | Admitting: Internal Medicine

## 2020-07-04 ENCOUNTER — Other Ambulatory Visit: Payer: Self-pay | Admitting: Internal Medicine

## 2020-08-07 ENCOUNTER — Other Ambulatory Visit: Payer: Self-pay | Admitting: Internal Medicine

## 2020-08-07 DIAGNOSIS — K219 Gastro-esophageal reflux disease without esophagitis: Secondary | ICD-10-CM

## 2020-08-07 MED ORDER — OMEPRAZOLE 40 MG PO CPDR: DELAYED_RELEASE_CAPSULE | ORAL | 0 refills | Status: DC

## 2020-08-21 ENCOUNTER — Encounter: Payer: BC Managed Care – PPO | Admitting: Internal Medicine

## 2020-08-28 ENCOUNTER — Ambulatory Visit (INDEPENDENT_AMBULATORY_CARE_PROVIDER_SITE_OTHER): Payer: BC Managed Care – PPO | Admitting: Internal Medicine

## 2020-08-28 ENCOUNTER — Other Ambulatory Visit: Payer: Self-pay

## 2020-08-28 ENCOUNTER — Encounter: Payer: Self-pay | Admitting: Internal Medicine

## 2020-08-28 VITALS — BP 134/98 | HR 109 | Temp 97.0°F | Resp 16 | Ht 71.0 in | Wt 224.0 lb

## 2020-08-28 DIAGNOSIS — U071 COVID-19: Secondary | ICD-10-CM | POA: Diagnosis not present

## 2020-08-28 DIAGNOSIS — Z1152 Encounter for screening for COVID-19: Secondary | ICD-10-CM | POA: Diagnosis not present

## 2020-08-28 LAB — POC COVID19 BINAXNOW: SARS Coronavirus 2 Ag: POSITIVE — AB

## 2020-08-28 MED ORDER — DEXAMETHASONE 4 MG PO TABS: ORAL_TABLET | ORAL | 0 refills | Status: DC

## 2020-08-28 NOTE — Patient Instructions (Signed)
COVID-19 Quarantine vs. Isolation QUARANTINE keeps someone who was in close contact with someone who has COVID-19 away from others. Quarantine if you have been in close contact with someone who has COVID-19, unless you have been fully vaccinated. If you are fully vaccinated  You do NOT need to quarantine unless they have symptoms  Get tested 3-5 days after your exposure, even if you don't have symptoms  Wear a mask indoors in public for 14 days following exposure or until your test result is negative If you are not fully vaccinated  Stay home for 14 days after your last contact with a person who has COVID-19  Watch for fever (100.59F), cough, shortness of breath, or other symptoms of COVID-19  If possible, stay away from people you live with, especially people who are at higher risk for getting very sick from COVID-19  Contact your local public health department for options in your area to possibly shorten your quarantine ISOLATION keeps someone who is sick or tested positive for COVID-19 without symptoms away from others, even in their own home. People who are in isolation should stay home and stay in a specific "sick room" or area and use a separate bathroom (if available). If you are sick and think or know you have COVID-19 Stay home until after  At least 10 days since symptoms first appeared and  At least 24 hours with no fever without the use of fever-reducing medications and  Symptoms have improved If you tested positive for COVID-19 but do not have symptoms  Stay home until after 10 days have passed since your positive viral test  If you develop symptoms after testing positive, follow the steps above for those who are sick [email or web address] ++++++++++++++++++++++++++++++++++  COVID-19: What to Do if You Are Sick If you have a fever, cough or other symptoms, you might have COVID-19. Most people have mild illness and are able to recover at home. If you are sick:  Keep  track of your symptoms.  If you have an emergency warning sign (including trouble breathing), call 911. Steps to help prevent the spread of COVID-19 if you are sick If you are sick with COVID-19 or think you might have COVID-19, follow the steps below to care for yourself and to help protect other people in your home and community. Stay home except to get medical care  Stay home. Most people with COVID-19 have mild illness and can recover at home without medical care. Do not leave your home, except to get medical care. Do not visit public areas.  Take care of yourself. Get rest and stay hydrated. Take over-the-counter medicines, such as acetaminophen, to help you feel better.  Stay in touch with your doctor. Call before you get medical care. Be sure to get care if you have trouble breathing, or have any other emergency warning signs, or if you think it is an emergency.  Avoid public transportation, ride-sharing, or taxis. Separate yourself from other people As much as possible, stay in a specific room and away from other people and pets in your home. If possible, you should use a separate bathroom. If you need to be around other people or animals in or outside of the home, wear a mask. Tell your close contactsthat they may have been exposed to COVID-19. An infected person can spread COVID-19 starting 48 hours (or 2 days) before the person has any symptoms or tests positive. By letting your close contacts know they may have been exposed to COVID-19,  you are helping to protect everyone.  Additional guidance is available for those living in close quarters and shared housing.  See COVID-19 and Animals if you have questions about pets.  If you are diagnosed with COVID-19, someone from the health department may call you. Answer the call to slow the spread. Monitor your symptoms  Symptoms of COVID-19 include fever, cough, or other symptoms.  Follow care instructions from your healthcare provider  and local health department. Your local health authorities may give instructions on checking your symptoms and reporting information. When to seek emergency medical attention Look for emergency warning signs* for COVID-19. If someone is showing any of these signs, seek emergency medical care immediately:  Trouble breathing  Persistent pain or pressure in the chest  New confusion  Inability to wake or stay awake  Pale, gray, or blue-colored skin, lips, or nail beds, depending on skin tone *This list is not all possible symptoms. Please call your medical provider for any other symptoms that are severe or concerning to you. Call 911 or call ahead to your local emergency facility: Notify the operator that you are seeking care for someone who has or may have COVID-19. Call ahead before visiting your doctor  Call ahead. Many medical visits for routine care are being postponed or done by phone or telemedicine.  If you have a medical appointment that cannot be postponed, call your doctor's office, and tell them you have or may have COVID-19. This will help the office protect themselves and other patients. Get  tested  If you have symptoms of COVID-19, get tested. While waiting for test results, you stay away from others, including staying apart from those living in your household.  You can visit your state, tribal, local, and territorialhealth department's website to look for the latest local information on testing sites. If you are sick, wear a mask over your nose and mouth  You should wear a mask over your nose and mouth if you must be around other people or animals, including pets (even at home).  You don't need to wear the mask if you are alone. If you can't put on a mask (because of trouble breathing, for example), cover your coughs and sneezes in some other way. Try to stay at least 6 feet away from other people. This will help protect the people around you.  Masks should not be placed  on young children under age 3 years, anyone who has trouble breathing, or anyone who is not able to remove the mask without help. Note: During the COVID-19 pandemic, medical grade facemasks are reserved for healthcare workers and some first responders. Cover your coughs and sneezes  Cover your mouth and nose with a tissue when you cough or sneeze.  Throw away used tissues in a lined trash can.  Immediately wash your hands with soap and water for at least 20 seconds. If soap and water are not available, clean your hands with an alcohol-based hand sanitizer that contains at least 60% alcohol. Clean your hands often  Wash your hands often with soap and water for at least 20 seconds. This is especially important after blowing your nose, coughing, or sneezing; going to the bathroom; and before eating or preparing food.  Use hand sanitizer if soap and water are not available. Use an alcohol-based hand sanitizer with at least 60% alcohol, covering all surfaces of your hands and rubbing them together until they feel dry.  Soap and water are the best option, especially if  hands are visibly dirty.  Avoid touching your eyes, nose, and mouth with unwashed hands.  Handwashing Tips Avoid sharing personal household items  Do not share dishes, drinking glasses, cups, eating utensils, towels, or bedding with other people in your home.  Wash these items thoroughly after using them with soap and water or put in the dishwasher. Clean all "high-touch" surfaces everyday  Clean and disinfect high-touch surfaces in your "sick room" and bathroom; wear disposable gloves. Let someone else clean and disinfect surfaces in common areas, but you should clean your bedroom and bathroom, if possible.  If a caregiver or other person needs to clean and disinfect a sick person's bedroom or bathroom, they should do so on an as-needed basis. The caregiver/other person should wear a mask and disposable gloves prior to  cleaning. They should wait as long as possible after the person who is sick has used the bathroom before coming in to clean and use the bathroom. ? High-touch surfaces include phones, remote controls, counters, tabletops, doorknobs, bathroom fixtures, toilets, keyboards, tablets, and bedside tables.  Clean and disinfect areas that may have blood, stool, or body fluids on them.  Use household cleaners and disinfectants. Clean the area or item with soap and water or another detergent if it is dirty. Then, use a household disinfectant. ? Be sure to follow the instructions on the label to ensure safe and effective use of the product. Many products recommend keeping the surface wet for several minutes to ensure germs are killed. Many also recommend precautions such as wearing gloves and making sure you have good ventilation during use of the product. ? Use a product from [employer] List N: Disinfectants for Coronavirus (LSLHT-34). ? Complete Disinfection Guidance When you can be around others after being sick with COVID-19 Deciding when you can be around others is different for different situations. Find out when you can safely end home isolation. For any additional questions about your care, contact your healthcare provider or state or local health department. 10/20/2019 Content source: [facility] ([facility]), Division of Viral Diseases

## 2020-08-28 NOTE — Progress Notes (Signed)
   History of Present Illness:      Patient is a 55 yo [name] presenting with a 10 day hx/o sinus & chest congestion, generalized myalgias .  Sputum has been clear & scant. Patient domestic partner tested (+) for Covidlast weeek and he tested (+) today , altho his sx's began 10 days abo    Medications  .  lisinopril-hctz 20-25 MG, Take 1 tablet Daily     .  simvastatin 20 MG tablet, Take  1 tablet at Bedtime for Cholesterol  .  montelukast 10 MG tablet, Take 1 tablet Daily for Allergies & asthma  .  meloxicam  15 MG tablet, Take 1/2 to 1 tablet Daily with Food for Pain & Inflammation   .  ALPRAZolam  1 MG tablet, Take 1/2 to 1 tablet 2 to 3 x / day ONLY if needed   .  VITAMIN D, Take 5,000 Units by mouth daily.   .  Multiple Vitamins-Minerals, Take daily  .  Omeprazole\ 40 MG capsule, Take 1 capsule Daily  .  PROCTO-MED HC 2.5 % rectal cream, APPLY CREAM  RECTALLY 4 TIMES DAILY  .  tiZANidine  4 MG tablet, Take 0.5-1 tab every 8  hours as needed for muscle spasms.  Problem list He has Essential hypertension; Hyperlipidemia, mixed; Abnormal glucose; Vitamin D deficiency; Medication management; Hx of pneumothorax; GERD (gastroesophageal reflux disease); Chronic anxiety; Snorings (without sleep apnea); Rhinitis due to pollen; Overweight (BMI 25.0-29.9); and Statin myopathy on their problem list.   Observations/Objective:   BP (!) 134/98   Pulse (!) 109   Temp (!) 97 F (36.1 C)   Resp 16   Ht 5\' 11"  (1.803 m)   Wt 224 lb (101.6 kg)   SpO2 97%   BMI 31.24 kg/m   HEENT - EACs patent & TMs Nl. N/O/P - clear.                  - (+) frontal & maxillary tenderness.   Neck - supple. No palpable Cx LNs.  Chest - Clear equal BS. No rales, rhonchi or wheezes Cor - Nl HS. RRR w/o sig MGR. PP 1(+). No edema. MS- FROM w/o deformities.  Gait Nl. Neuro -  Nl w/o focal abnormalities. Skin- clear exposed w/o rash or cyanosis.   Assessment and Plan:  1. COVID-19 virus infection  -  dexamethasone (DECADRON) 4 MG tablet; Take 1 tab 3 x day - 3 days, then 2 x day - 3 days, then 1 tab daily  Dispense: 20 tablet  - advised call if sx's change  2. Encounter for screening for COVID-19  - POC COVID-19  Follow Up Instructions:        I discussed the assessment and treatment plan with the patient. The patient was provided an opportunity to ask questions and all were answered. The patient agreed with the plan and demonstrated an understanding of the instructions.       The patient was advised to call back or seek an in-person evaluation if the symptoms worsen or if the condition fails to improve as anticipated.   Kirtland Bouchard, MD

## 2020-08-31 ENCOUNTER — Other Ambulatory Visit: Payer: Self-pay | Admitting: Internal Medicine

## 2020-08-31 MED ORDER — AZITHROMYCIN 250 MG PO TABS: ORAL_TABLET | ORAL | 1 refills | Status: DC

## 2020-08-31 MED ORDER — BENZONATATE 200 MG PO CAPS: ORAL_CAPSULE | ORAL | 1 refills | Status: DC

## 2020-09-20 ENCOUNTER — Ambulatory Visit (INDEPENDENT_AMBULATORY_CARE_PROVIDER_SITE_OTHER): Payer: BC Managed Care – PPO | Admitting: Adult Health Nurse Practitioner

## 2020-09-20 ENCOUNTER — Other Ambulatory Visit: Payer: Self-pay

## 2020-09-20 ENCOUNTER — Encounter: Payer: Self-pay | Admitting: Adult Health Nurse Practitioner

## 2020-09-20 VITALS — BP 126/88 | HR 90 | Temp 97.5°F | Wt 227.0 lb

## 2020-09-20 DIAGNOSIS — F419 Anxiety disorder, unspecified: Secondary | ICD-10-CM

## 2020-09-20 DIAGNOSIS — J3089 Other allergic rhinitis: Secondary | ICD-10-CM

## 2020-09-20 DIAGNOSIS — E663 Overweight: Secondary | ICD-10-CM

## 2020-09-20 DIAGNOSIS — I10 Essential (primary) hypertension: Secondary | ICD-10-CM | POA: Diagnosis not present

## 2020-09-20 DIAGNOSIS — G720 Drug-induced myopathy: Secondary | ICD-10-CM

## 2020-09-20 DIAGNOSIS — E559 Vitamin D deficiency, unspecified: Secondary | ICD-10-CM

## 2020-09-20 DIAGNOSIS — R5381 Other malaise: Secondary | ICD-10-CM

## 2020-09-20 DIAGNOSIS — Z79899 Other long term (current) drug therapy: Secondary | ICD-10-CM

## 2020-09-20 DIAGNOSIS — R5383 Other fatigue: Secondary | ICD-10-CM | POA: Diagnosis not present

## 2020-09-20 DIAGNOSIS — T466X5A Adverse effect of antihyperlipidemic and antiarteriosclerotic drugs, initial encounter: Secondary | ICD-10-CM

## 2020-09-20 DIAGNOSIS — R7309 Other abnormal glucose: Secondary | ICD-10-CM | POA: Diagnosis not present

## 2020-09-20 DIAGNOSIS — E782 Mixed hyperlipidemia: Secondary | ICD-10-CM | POA: Diagnosis not present

## 2020-09-20 NOTE — Progress Notes (Signed)
FOLLOW UP 3 MONTH  Assessment and Plan:   Hypertension Well controlled with current medications  Monitor blood pressure at home; patient to call if consistently greater than 130/80 Continue DASH diet.   Reminder to go to the ER if any CP, SOB, nausea, dizziness, severe HA, changes vision/speech, left arm numbness and tingling and jaw pain.  Cholesterol LDL and total cholesterol remain poorly controlled; LDL decreased to 203 Discussed coronary calcium study Very resistant to meds; discussed risks of untreated cholesterol including MI, CVA, vascular dementia Continue low cholesterol diet and exercise.  Check lipid panel  Other abnormal glucose Recent A1Cs well controlled  Continue diet and exercise.  Perform daily foot/skin check, notify office of any concerning changes.  Defer A1C; check CMP  Overweight - BMI 29 Long discussion about weight loss, diet, and exercise Recommended diet heavy in fruits and veggies and low in animal meats, cheeses, and dairy products, appropriate calorie intake Discussed ideal weight for height and  weight goal (200 lb) Will follow up in 3 months  Vitamin D Def Near goal at last visit; continue supplementation to maintain goal of 60-100 Defer Vit D level  Anxiety Well managed by current regimen; continue medications Using Alprazolam 1mg , half tablet at night for sleep. Stress management techniques discussed, increase water, good sleep hygiene discussed, increase exercise, and increase veggies.   Medication Management  Continued  NonSeasonal alleregic Rhinitis Continue Singulair daily Change antihistamine to Zyrtec can take with singulair.    Continue diet and meds as discussed. Further disposition pending results of labs. Discussed med's effects and SE's.   Over 30 minutes of face to face interview, exam, counseling, chart review, and critical decision making was performed.   Future Appointments  Date Time Provider [place]   01/05/2021 11:00 AM Unk Pinto, MD [facility] None    ----------------------------------------------------------------------------------------------------------------------  HPI 55 y.o. male  presents for 3 month follow up on hypertension, cholesterol, glucose management, obesity and vitamin D deficiency.  Patient was evaluated  by Dr Brett Fairy and sleep study showed only snoring w/o apnea and he declined f/u or referral for an oral appliance.  Patient report overall he is doing well today.  He has COVID19 on 08/28/20.  He has some post covid concerns today.  He reports he is having water eyes.  He also has some drainage in the back of his throat. He is taking singulair but stopped and was taking Allegra.  He had joint pains with covid and reports that this has started slowly coming back.  He ihas had low energy and also   Insomina he is using alprazolam 1/2 tablet which is helping.  He has some lower back pain, has seen [facility], has MRI with "arthritis"; has been taking tylenol and meloxicam, has had some hip spasms.  BMI is Body mass index is 31.66 kg/m., he has been working on diet and exercise, he is very active in his yard/garden, also physical job. Eating lots of veggies.  Wt Readings from Last 3 Encounters:  09/20/20 227 lb (103 kg)  08/28/20 224 lb (101.6 kg)  06/12/20 223 lb (101.2 kg)   His blood pressure has been controlled at home (130s/70-80s), today their BP is BP: 126/88  He does workout. He denies chest pain, shortness of breath, dizziness.   He is not on cholesterol medication (was on zetia 10 mg daily and livalo 2 mg daily but reports had severe fatigue, also some leg pain and stopped taking). Hx of statin myalgias with  other statins.   His cholesterol is not at goal, remains severely elevated. He had cardio IQ panel in 05/2017 which showed LDL particle 2305, LDL small 529, apolipoprotein B of 146. Family history of hyperlipidemia but no MI/CVA. The cholesterol  last visit was:   Lab Results  Component Value Date   CHOL 312 (H) 06/12/2020   HDL 57 06/12/2020   LDLCALC 203 (H) 06/12/2020   TRIG 306 (H) 06/12/2020   CHOLHDL 5.5 (H) 06/12/2020    He has been working on diet and exercise for glucose management, and denies foot ulcerations, increased appetite, nausea, paresthesia of the feet, polydipsia, polyuria, visual disturbances, vomiting and weight loss. Last A1C in the office was:  Lab Results  Component Value Date   HGBA1C 5.4 06/12/2020    Patient is on Vitamin D supplement.   Lab Results  Component Value Date   VD25OH 67 06/12/2020       Current Medications:  Current Outpatient Medications on File Prior to Visit  Medication Sig  . ALPRAZolam (XANAX) 1 MG tablet Take     1/2 to 1 tablet      2 to 3 x / day         ONLY if needed for Anxiety Attack & please try to limit to 5 days /week to avoid Addiction & Dementia  . cholecalciferol (VITAMIN D) 1000 UNITS tablet Take 5,000 Units by mouth daily.   Marland Kitchen ezetimibe (ZETIA) 10 MG tablet Take 1 tablet Daily for Cholesterol  . lidocaine-prilocaine (EMLA) cream APPLY TOPICALLY AS NEEDED  . lisinopril-hydrochlorothiazide (ZESTORETIC) 20-25 MG tablet Take 1 tablet     Daily     for BP & Fluid Retention / Ankle Swelling  . meloxicam (MOBIC) 15 MG tablet Take 1/2 to 1 tablet Daily with Food for Pain & Inflammation (Patient taking differently: as needed. Take 1/2 to 1 tablet Daily with Food for Pain & Inflammation)  . montelukast (SINGULAIR) 10 MG tablet Take 1 tablet Daily for Allergies & asthma  . Multiple Vitamins-Minerals (MULTIVITAMIN PO) Take by mouth.  Marland Kitchen omeprazole (PRILOSEC) 40 MG capsule Take      1 capsule      Daily      to Prevent Heartburn & Indigestion  . PROCTO-MED HC 2.5 % rectal cream APPLY CREAM  RECTALLY 4 TIMES DAILY  . simvastatin (ZOCOR) 20 MG tablet Take      1 tablet       at Bedtime        for Cholesterol  . tiZANidine (ZANAFLEX) 4 MG tablet Take 0.5-1 tablets (2-4 mg total)  by mouth every 8 (eight) hours as needed for muscle spasms.   No current facility-administered medications on file prior to visit.     Allergies:  Allergies  Allergen Reactions  . Lipitor [Atorvastatin]     Myalgias  . Prozac [Fluoxetine Hcl]   . Rosuvastatin     myalgias  . Simvastatin     Myalgias     Medical History:  Past Medical History:  Diagnosis Date  . Allergy   . GERD (gastroesophageal reflux disease)   . Hyperlipidemia   . Hypertension   . IBS (irritable bowel syndrome)   . Traumatic pneumothorax 12/04/2015  . Vitamin D deficiency    Family history- Reviewed and unchanged Social history- Reviewed and unchanged   Review of Systems:  Review of Systems  Constitutional: Negative for malaise/fatigue and weight loss.  HENT: Negative for hearing loss and tinnitus.   Eyes: Negative  for blurred vision and double vision.  Respiratory: Negative for cough, shortness of breath and wheezing.   Cardiovascular: Negative for chest pain, palpitations, orthopnea, claudication and leg swelling.  Gastrointestinal: Negative for abdominal pain, blood in stool, constipation, diarrhea, heartburn, melena, nausea and vomiting.  Genitourinary: Negative.   Musculoskeletal: Negative for joint pain and myalgias.  Skin: Negative for rash.  Neurological: Negative for dizziness, tingling, sensory change, weakness and headaches.  Endo/Heme/Allergies: Negative for polydipsia.  Psychiatric/Behavioral: Negative.   All other systems reviewed and are negative.    Physical Exam: BP 126/88   Pulse 90   Temp (!) 97.5 F (36.4 C)   Wt 227 lb (103 kg)   SpO2 98%   BMI 31.66 kg/m  Wt Readings from Last 3 Encounters:  09/20/20 227 lb (103 kg)  08/28/20 224 lb (101.6 kg)  06/12/20 223 lb (101.2 kg)   General Appearance: Well nourished, in no apparent distress. Eyes: PERRLA, EOMs, conjunctiva no swelling or erythema Sinuses: No Frontal/maxillary tenderness ENT/Mouth: Ext aud canals clear,  TMs without erythema, bulging. No erythema, swelling, or exudate on post pharynx.  Tonsils not swollen or erythematous. Hearing normal.  Neck: Supple, thyroid normal.  Respiratory: Respiratory effort normal, BS equal bilaterally without rales, rhonchi, wheezing or stridor.  Cardio: RRR with no MRGs. Brisk peripheral pulses without edema.  Abdomen: Soft, + BS.  Non tender, no guarding, rebound, hernias, masses. Lymphatics: Non tender without lymphadenopathy.  Musculoskeletal: Full ROM, 5/5 strength, Normal gait. R hip greater trochanter tenderness, IT band tenderness Skin: Warm, dry without rashes, lesions, ecchymosis.  Neuro: Cranial nerves intact. No cerebellar symptoms.  Psych: Awake and oriented X 3, normal affect, Insight and Judgment appropriate.    Garnet Sierras, Laqueta Jean, DNP [facility] 09/20/2020  9:32 AM

## 2020-09-21 LAB — COMPLETE METABOLIC PANEL WITH GFR
AG Ratio: 1.9 (calc) (ref 1.0–2.5)
ALT: 32 U/L (ref 9–46)
AST: 27 U/L (ref 10–35)
Albumin: 4.5 g/dL (ref 3.6–5.1)
Alkaline phosphatase (APISO): 62 U/L (ref 35–144)
BUN: 10 mg/dL (ref 7–25)
CO2: 24 mmol/L (ref 20–32)
Calcium: 8.8 mg/dL (ref 8.6–10.3)
Chloride: 104 mmol/L (ref 98–110)
Creat: 0.72 mg/dL (ref 0.70–1.33)
GFR, Est African American: 121 mL/min/{1.73_m2} (ref >=60)
GFR, Est Non African American: 104 mL/min/{1.73_m2} (ref >=60)
Globulin: 2.4 g/dL (calc) (ref 1.9–3.7)
Glucose, Bld: 97 mg/dL (ref 65–99)
Potassium: 4 mmol/L (ref 3.5–5.3)
Sodium: 138 mmol/L (ref 135–146)
Total Bilirubin: 1.1 mg/dL (ref 0.2–1.2)
Total Protein: 6.9 g/dL (ref 6.1–8.1)

## 2020-09-21 LAB — LIPID PANEL
Cholesterol: 251 mg/dL — ABNORMAL HIGH (ref <200)
HDL: 63 mg/dL (ref >=40)
LDL Cholesterol (Calc): 149 mg/dL (calc) — ABNORMAL HIGH
Non-HDL Cholesterol (Calc): 188 mg/dL (calc) — ABNORMAL HIGH (ref <130)
Total CHOL/HDL Ratio: 4 (calc) (ref <5.0)
Triglycerides: 252 mg/dL — ABNORMAL HIGH (ref <150)

## 2020-09-21 LAB — CBC WITH DIFFERENTIAL/PLATELET
Absolute Monocytes: 374 cells/uL (ref 200–950)
Basophils Absolute: 32 cells/uL (ref 0–200)
Basophils Relative: 0.7 %
Eosinophils Absolute: 131 cells/uL (ref 15–500)
Eosinophils Relative: 2.9 %
HCT: 40.3 % (ref 38.5–50.0)
Hemoglobin: 13.6 g/dL (ref 13.2–17.1)
Lymphs Abs: 1458 cells/uL (ref 850–3900)
MCH: 31.6 pg (ref 27.0–33.0)
MCHC: 33.7 g/dL (ref 32.0–36.0)
MCV: 93.7 fL (ref 80.0–100.0)
MPV: 10.2 fL (ref 7.5–12.5)
Monocytes Relative: 8.3 %
Neutro Abs: 2507 cells/uL (ref 1500–7800)
Neutrophils Relative %: 55.7 %
Platelets: 231 10*3/uL (ref 140–400)
RBC: 4.3 10*6/uL (ref 4.20–5.80)
RDW: 12.6 % (ref 11.0–15.0)
Total Lymphocyte: 32.4 %
WBC: 4.5 10*3/uL (ref 3.8–10.8)

## 2020-09-21 LAB — TSH: TSH: 0.99 mIU/L (ref 0.40–4.50)

## 2020-09-21 LAB — VITAMIN D 25 HYDROXY (VIT D DEFICIENCY, FRACTURES): Vit D, 25-Hydroxy: 65 ng/mL (ref 30–100)

## 2020-09-21 LAB — IRON, TOTAL/TOTAL IRON BINDING CAP
%SAT: 48 % (calc) (ref 20–48)
Iron: 172 ug/dL (ref 50–180)
TIBC: 357 mcg/dL (calc) (ref 250–425)

## 2020-09-28 ENCOUNTER — Other Ambulatory Visit: Payer: Self-pay | Admitting: Internal Medicine

## 2020-09-28 DIAGNOSIS — F419 Anxiety disorder, unspecified: Secondary | ICD-10-CM

## 2020-09-28 MED ORDER — ALPRAZOLAM 1 MG PO TABS
ORAL_TABLET | ORAL | 0 refills | Status: DC
Start: 2020-09-28 — End: 2021-01-02

## 2020-11-06 ENCOUNTER — Encounter: Payer: BC Managed Care – PPO | Admitting: Internal Medicine

## 2020-11-23 ENCOUNTER — Telehealth: Payer: Self-pay | Admitting: Internal Medicine

## 2020-11-23 NOTE — Telephone Encounter (Signed)
Patient called office for GI referral w/ c/o heart burn, burning  throat, low abdominal pain, cramping,upset stomach, loose stool, no appetite  x 2-31months, 1xwk increased symptoms & pain and facial flushing after eating. Patient states he has stopped omeparzole 40mg  - he thinks it was causing some of the symptoms. Stomach so upset he stopped Cholesterol medicine and all multivitamins. Taking Pepto to soothe burning stomach.  Per Dr. Melford Aase recommended  office visit to evaluate. Scheduled patient for office visit and discussed heart attack symptoms and strict ER precautions. Patient states he does not feel like this is a  heart attack,  He does monitor blood pressure.  The patient agrees to call our office with any problems, questions or concerns or go to the ER if any further progression of symptoms.

## 2020-11-24 ENCOUNTER — Ambulatory Visit (INDEPENDENT_AMBULATORY_CARE_PROVIDER_SITE_OTHER): Payer: BC Managed Care – PPO | Admitting: Adult Health

## 2020-11-24 ENCOUNTER — Other Ambulatory Visit: Payer: Self-pay

## 2020-11-24 ENCOUNTER — Encounter: Payer: Self-pay | Admitting: Adult Health

## 2020-11-24 VITALS — BP 116/88 | HR 106 | Temp 97.7°F | Resp 16 | Ht 71.0 in | Wt 221.4 lb

## 2020-11-24 DIAGNOSIS — K2100 Gastro-esophageal reflux disease with esophagitis, without bleeding: Secondary | ICD-10-CM

## 2020-11-24 DIAGNOSIS — R198 Other specified symptoms and signs involving the digestive system and abdomen: Secondary | ICD-10-CM

## 2020-11-24 DIAGNOSIS — R1310 Dysphagia, unspecified: Secondary | ICD-10-CM | POA: Diagnosis not present

## 2020-11-24 MED ORDER — SUCRALFATE 1 G PO TABS
ORAL_TABLET | ORAL | 1 refills | Status: DC
Start: 2020-11-24 — End: 2021-03-11

## 2020-11-24 MED ORDER — FAMOTIDINE 20 MG PO TABS: 20.0000 mg | ORAL_TABLET | Freq: Two times a day (BID) | ORAL | 1 refills | Status: DC

## 2020-11-24 NOTE — Patient Instructions (Signed)
Recommend bland foods - small meals  Avoid fatty foods  All citrus, tomato, alcohol, caffeine, chocolate, mint, bananas  NSAIDS- ibuprofen, meloxicam  Will refer to Dr. Silverio Decamp take a look -        Food Choices for Gastroesophageal Reflux Disease, Adult When you have gastroesophageal reflux disease (GERD), the foods you eat and your eating habits are very important. Choosing the right foods can help ease your discomfort. Think about working with a food expert (dietitian) to help you make good choices. What are tips for following this plan? Reading food labels  Look for foods that are low in saturated fat. Foods that may help with your symptoms include: ? Foods that have less than 5% of daily value (DV) of fat. ? Foods that have 0 grams of trans fat. Cooking  Do not fry your food.  Cook your food by baking, steaming, grilling, or broiling. These are all methods that do not need a lot of fat for cooking.  To add flavor, try to use herbs that are low in spice and acidity. Meal planning  Choose healthy foods that are low in fat, such as: ? Fruits and vegetables. ? Whole grains. ? Low-fat dairy products. ? Lean meats, fish, and poultry.  Eat small meals often instead of eating 3 large meals each day. Eat your meals slowly in a place where you are relaxed. Avoid bending over or lying down until 2-3 hours after eating.  Limit high-fat foods such as fatty meats or fried foods.  Limit your intake of fatty foods, such as oils, butter, and shortening.  Avoid the following as told by your doctor: ? Foods that cause symptoms. These may be different for different people. Keep a food diary to keep track of foods that cause symptoms. ? Alcohol. ? Drinking a lot of liquid with meals. ? Eating meals during the 2-3 hours before bed.   Lifestyle  Stay at a healthy weight. Ask your doctor what weight is healthy for you. If you need to lose weight, work with your doctor to do so  safely.  Exercise for at least 30 minutes on 5 or more days each week, or as told by your doctor.  Wear loose-fitting clothes.  Do not smoke or use any products that contain nicotine or tobacco. If you need help quitting, ask your doctor.  Sleep with the head of your bed higher than your feet. Use a wedge under the mattress or blocks under the bed frame to raise the head of the bed.  Chew sugar-free gum after meals. What foods should eat? Eat a healthy, well-balanced diet of fruits, vegetables, whole grains, low-fat dairy products, lean meats, fish, and poultry. Each person is different. Foods that may cause symptoms in one person may not cause any symptoms in another person. Work with your doctor to find foods that are safe for you. The items listed above may not be a complete list of what you can eat and drink. Contact a food expert for more options.   What foods should I avoid? Limiting some of these foods may help in managing the symptoms of GERD. Everyone is different. Talk with a food expert or your doctor to help you find the exact foods to avoid, if any. Fruits Any fruits prepared with added fat. Any fruits that cause symptoms. For some people, this may include citrus fruits, such as oranges, grapefruit, pineapple, and lemons. Vegetables Deep-fried vegetables. [place] fries. Any vegetables prepared with added fat. Any vegetables  that cause symptoms. For some people, this may include tomatoes and tomato products, chili peppers, onions and garlic, and horseradish. Grains Pastries or quick breads with added fat. Meats and other proteins High-fat meats, such as fatty beef or pork, hot dogs, ribs, ham, sausage, salami, and bacon. Fried meat or protein, including fried fish and fried chicken. Nuts and nut butters, in large amounts. Dairy Whole milk and chocolate milk. Sour cream. Cream. Ice cream. Cream cheese. Milkshakes. Fats and oils Butter. Margarine. Shortening.  Ghee. Beverages Coffee and tea, with or without caffeine. Carbonated beverages. Sodas. Energy drinks. Fruit juice made with acidic fruits, such as orange or grapefruit. Tomato juice. Alcoholic drinks. Sweets and desserts Chocolate and cocoa. Donuts. Seasonings and condiments Pepper. Peppermint and spearmint. Added salt. Any condiments, herbs, or seasonings that cause symptoms. For some people, this may include curry, hot sauce, or vinegar-based salad dressings. The items listed above may not be a complete list of what you should not eat and drink. Contact a food expert for more options. Questions to ask your doctor Diet and lifestyle changes are often the first steps that are taken to manage symptoms of GERD. If diet and lifestyle changes do not help, talk with your doctor about taking medicines. Where to find more information  International Foundation for Gastrointestinal Disorders: aboutgerd.org Summary  When you have GERD, food and lifestyle choices are very important in easing your symptoms.  Eat small meals often instead of 3 large meals a day. Eat your meals slowly and in a place where you are relaxed.  Avoid bending over or lying down until 2-3 hours after eating.  Limit high-fat foods such as fatty meats or fried foods. This information is not intended to replace advice given to you by your health care provider. Make sure you discuss any questions you have with your health care provider. Document Revised: 01/31/2020 Document Reviewed: 01/31/2020 Elsevier Patient Education  [place].

## 2020-11-24 NOTE — Progress Notes (Signed)
Assessment and Plan:   Matthew Salinas was seen today for abdominal pain.  Diagnoses and all orders for this visit:  Gastroesophageal reflux disease with esophagitis, unspecified whether hemorrhage Dysphagia Irregular bowel habits Not resolving with PPI, ? Having atypical intolerance  Advised try OTC nexium BID prior to meals Try famotidine BID prior to meals if doesn't tolerate nexium Add carafate 1 g TID, dissolve in liquid 30 min prior to meals and bedtime Long discussion about lifestyle; try elevating HOB 2 inches, avoid all citrus, tomato, alcohol, NSAID, caffeine strictly for 12 weeks Will refer to GI for likely need EGD Also ? Mucus, r/o inflammatory bowel disease underlying  Please go to the ER if you have any severe AB pain, unable to hold down food/water, blood in stool or vomit, chest pain, shortness of breath, or any worsening symptoms.  -     Ambulatory referral to Gastroenterology -     famotidine (PEPCID) 20 MG tablet; Take 1 tablet (20 mg total) by mouth 2 (two) times daily. -     sucralfate (CARAFATE) 1 g tablet; Dissolve 1 tab in 2-3 ounces of fluid 4 times a day 30 min prior to meals and bedtime.  Further disposition pending results of labs. Discussed med's effects and SE's.   Over 30 minutes of exam, counseling, chart review, and critical decision making was performed.   Future Appointments  Date Time Provider Matthew Salinas  01/05/2021 11:00 AM Unk Pinto, MD [facility] None    ------------------------------------------------------------------------------------------------------------------   HPI BP 116/88   Pulse (!) 106   Temp 97.7 F (36.5 C)   Resp 16   Ht 5\' 11"  (1.803 m)   Wt 221 lb 6.4 oz (100.4 kg)   SpO2 96%   BMI 30.88 kg/m   55 y.o.male with hx of GERD for many years, also IBS presents for evaluation of reflux sx and general GI concerns.   He reports had covid 19 in 08/2020, didn't eat for several days, noted 3-4 weeks after started noting  atypical GI sx, was noting more burning in throat and chest, has had sores on tongue, reflux sx, some dysphagia with food or water, has been taking omeprazole as he has for many years, but recently has noted omeprazole will cause lower abdominal discomfort, constipation, some pain. Some loose stools when not taking. Has tried famotidine last week which helped and didn't cause constipation/discomfort but not taking this consistently. No other atypical events recently.   Has been doing more bland soups with chicken and veggies and tolerates this well but can't keep up. Notes poor appetite, doesn't want to eat after two bites but denies early satiety sensation.   He was prescribed steroid taper during covid 19, did take some ibuprofen during pandemic, is prescribed meloxicam but hasn't taken recently, using less with recent GI issues, only took 1/2 tab with lots of food several weeks ago.   He does drink beer, a few on the weekend, doesn't seem to cause problems. Avoiding harder liquors  He has noted typical hemorrhoidal bleeding intermittently, resolves with topical hydrocortisone quickly. Denies dark stools except with pepto bismol. Has noted some mucus in stool intermittently.   Past Medical History:  Diagnosis Date  . Allergy   . GERD (gastroesophageal reflux disease)   . Hyperlipidemia   . Hypertension   . IBS (irritable bowel syndrome)   . Traumatic pneumothorax 12/04/2015  . Vitamin D deficiency      Allergies  Allergen Reactions  . Lipitor [Atorvastatin]  Myalgias  . Prozac [Fluoxetine Hcl]   . Rosuvastatin     myalgias  . Simvastatin     Myalgias    Current Outpatient Medications on File Prior to Visit  Medication Sig  . ALPRAZolam (XANAX) 1 MG tablet Take  1/2 to 1 tablet  2 to 3 x / day  ONLY  if needed for Anxiety Attack & please try to limit to 5 days /week to avoid Addiction & Dementia  . cholecalciferol (VITAMIN D) 1000 UNITS tablet Take 5,000 Units by mouth daily.    Marland Kitchen ezetimibe (ZETIA) 10 MG tablet Take 1 tablet Daily for Cholesterol  . lidocaine-prilocaine (EMLA) cream APPLY TOPICALLY AS NEEDED  . lisinopril-hydrochlorothiazide (ZESTORETIC) 20-25 MG tablet Take 1 tablet     Daily     for BP & Fluid Retention / Ankle Swelling  . meloxicam (MOBIC) 15 MG tablet Take 1/2 to 1 tablet Daily with Food for Pain & Inflammation (Patient taking differently: as needed. Take 1/2 to 1 tablet Daily with Food for Pain & Inflammation)  . montelukast (SINGULAIR) 10 MG tablet Take 1 tablet Daily for Allergies & asthma  . Multiple Vitamins-Minerals (MULTIVITAMIN PO) Take by mouth.  Marland Kitchen omeprazole (PRILOSEC) 40 MG capsule Take      1 capsule      Daily      to Prevent Heartburn & Indigestion  . PROCTO-MED HC 2.5 % rectal cream APPLY CREAM  RECTALLY 4 TIMES DAILY  . simvastatin (ZOCOR) 20 MG tablet Take      1 tablet       at Bedtime        for Cholesterol  . tiZANidine (ZANAFLEX) 4 MG tablet Take 0.5-1 tablets (2-4 mg total) by mouth every 8 (eight) hours as needed for muscle spasms.   No current facility-administered medications on file prior to visit.   Allergies:  Allergies  Allergen Reactions  . Lipitor [Atorvastatin]     Myalgias  . Prozac [Fluoxetine Hcl]   . Rosuvastatin     myalgias  . Simvastatin     Myalgias   Surgical History:  He  has a past surgical history that includes Chest tube insertion (Right, 05-15-02) and Toe Surgery (2008). Family History:  Hisfamily history includes Cancer in his mother; Colon cancer in his maternal grandmother; Dementia in his brother; Diabetes in his maternal grandfather; Hyperlipidemia in his brother, brother, brother, father, and mother; Hypertension in his father. Social History:   reports that he quit smoking about 19 years ago. He has never used smokeless tobacco. He reports current alcohol use of about 10.0 standard drinks of alcohol per week. He reports that he does not use drugs.    ROS: all negative except above.    Physical Exam:  BP 116/88   Pulse (!) 106   Temp 97.7 F (36.5 C)   Resp 16   Ht 5\' 11"  (1.803 m)   Wt 221 lb 6.4 oz (100.4 kg)   SpO2 96%   BMI 30.88 kg/m   General Appearance: Well nourished, in no apparent distress. Eyes: PERRLA, EOMs, conjunctiva no swelling or erythema Sinuses: No Frontal/maxillary tenderness ENT/Mouth: Ext aud canals clear, TMs without erythema, bulging. No erythema, swelling, or exudate on post pharynx.  Tonsils not swollen or erythematous. No tongue sores/lesions seen today. Hearing normal.  Neck: Supple Respiratory: Respiratory effort normal, BS equal bilaterally without rales, rhonchi, wheezing or stridor.  Cardio: RRR with no MRGs.  Abdomen: Soft, + BS.  Mild epigastric tenderness, no guarding,  rebound, hernias, masses. Lymphatics: Non tender without lymphadenopathy.  Musculoskeletal: normal gait.  Skin: Warm, dry without rashes, lesions, ecchymosis.  Neuro: Normal muscle tone  Psych: Awake and oriented X 3, normal affect, Insight and Judgment appropriate.   Izora Ribas, NP 9:35 AM [facility]

## 2020-11-29 ENCOUNTER — Encounter: Payer: Self-pay | Admitting: Physician Assistant

## 2020-12-04 ENCOUNTER — Encounter: Payer: BC Managed Care – PPO | Admitting: Internal Medicine

## 2020-12-07 ENCOUNTER — Other Ambulatory Visit: Payer: Self-pay | Admitting: General Surgery

## 2020-12-07 NOTE — Progress Notes (Signed)
Subjective:     Patient ID: Matthew Salinas is a 55 y.o. male.  HPI  The following portions of the patient's history were reviewed and updated as appropriate.  This a new patient is here today for: office visit. The patient is here today for evaluation of sever heartburn, diarrhea, and trouble swallowing. Patient reports he has bowel movements after he eats. Trigger foods are orange juice and bell pepper which he avoids. Patient states he does have hemorrhoids and has bleeding when he wipes after a bowel movement and sometimes in the toilet bowl. Patient states his weight was down 10 pounds in 3 weeks. He reports he had a colonoscopy at age 13 and 41 due to a maternal grandmother with colon cancer.   The patient reports a long history of enlarged tonsils but no sore throat.  His fiance does report he snores and has episodes of gasping.  He was unable to tolerate an out of home sleep study.  He has been on multiple PPIs in the past, presently on Pepcid.  He does report awakening from sleep but it was unclear whether this was in response to his sleep apnea (strongly suspected) or reflux.  Reports when he has bad heartburn he will have stomach cramping and has lost some appetite.  When he has episodes of dysphagia which can occur after any foods including liquids he does not need to vomit the material, but frequently will leave the table and not resume eating.  He is a retired Dealer, working part-time in an Sales promotion account executive for [employer].  He reports that he has intermittent loose stools over the last 6 weeks and that he had a fissure 1 year ago.  He can have bleeding when his stools are particularly upset.  He has found that green peppers and orange juice produce significant abdominal discomfort.   The patient was prompted to seek medical attention by his fianc of 15+ years, Callie Fielding, RN,  who was concerned about his weight loss..    The patient is accompanied by Ms.  Sanders today.      Chief Complaint  Patient presents with  . Heartburn  . Diarrhea  . Dysphagia     BP 104/70   Pulse 91   Temp 36.7 C (98.1 F)   Ht 180.3 cm (5\' 11" )   Wt 100.7 kg (222 lb)   SpO2 96%   BMI 30.96 kg/m       Past Medical History:  Diagnosis Date  . GERD (gastroesophageal reflux disease)   . Hyperlipidemia   . Hypertension   . IBS (irritable bowel syndrome)   . Traumatic pneumothorax 12/04/2015  . Vitamin D deficiency           Past Surgical History:  Procedure Laterality Date  . CHEST TUBE PLACEMENT Right 05/15/2002  . COLONOSCOPY  06/28/2015  . COLONOSCOPY  01/17/2005  . toe surgery  2008       Social History          Socioeconomic History  . Marital status: Married  Tobacco Use  . Smoking status: Former Smoker    Years: 15.00    Types: Cigarettes    Quit date: 08/05/2000    Years since quitting: 20.3  . Smokeless tobacco: Never Used           Allergies  Allergen Reactions  . Lipitor [Atorvastatin] Other (See Comments)  . Prozac [Fluoxetine] Other (See Comments)  . Rosuvastatin Other (See Comments)  . Simvastatin Other (See  Comments)    Current Medications        Current Outpatient Medications  Medication Sig Dispense Refill  . famotidine (PEPCID ORAL) Take by mouth once daily    . lisinopriL-hydrochlorothiazide (ZESTORETIC) 20-25 mg tablet Patient taking half a tablet daily.    Marland Kitchen omeprazole (PRILOSEC) 40 MG DR capsule TAKE 1 CAPSULE BY MOUTH DAILY TO PREVENT HEARTBURN AND INDIGESTION     No current facility-administered medications for this visit.           Family History  Problem Relation Age of Onset  . Hyperlipidemia (Elevated cholesterol) Mother   . Cancer Mother   . Hyperlipidemia (Elevated cholesterol) Father   . High blood pressure (Hypertension) Father   . Hyperlipidemia (Elevated cholesterol) Brother   . Dementia Brother   . Colon cancer Maternal Grandmother    . Diabetes Maternal Grandfather      Review of Systems  Constitutional: Negative for chills and fever.  Respiratory: Negative for cough.        Objective:   Physical Exam Constitutional:      Appearance: Normal appearance.  HENT:     Mouth/Throat:     Comments: Mild tonsillar enlargement, nowhere near approaching each other in the midline.  No pharyngitis.  No stridor. Cardiovascular:     Rate and Rhythm: Normal rate and regular rhythm.     Pulses: Normal pulses.     Heart sounds: Normal heart sounds.  Pulmonary:     Effort: Pulmonary effort is normal.     Breath sounds: Normal breath sounds.  Chest:  Breasts:     Right: No supraclavicular adenopathy.     Left: No supraclavicular adenopathy.    Lymphadenopathy:     Cervical: No cervical adenopathy.     Upper Body:     Right upper body: No supraclavicular adenopathy.     Left upper body: No supraclavicular adenopathy.  Neurological:     Mental Status: He is alert and oriented to person, place, and time.  Psychiatric:        Mood and Affect: Mood normal.        Behavior: Behavior normal.    Labs and Radiology:   Office notes from November 24, 2020 from [name], NP were reviewed.  Of note on Mobic for arthritis pain.  Antihypertensives.  Laboratory studies in September 20, 2020 were reviewed:  Hemoglobin 13.6 with an MCV of 93.7.  Normal white blood cell count of 4500.  Comprehensive metabolic panel showed a creatinine of 0.72 with normal electrolytes, estimated GFR of 104.  Normal liver function studies.  Elevated cholesterol and triglycerides and LDL.  Iron state was normal with a iron of 172, TIBC of C of 357 and saturation 48%.  Normal vitamin D3 and TSH.  Colonoscopy dated June 28, 2015 was reviewed.  The study showed small internal/external hemorrhoids as well as scattered diverticulosis throughout the colon.  No intraluminal lesions.  A 10-year follow-up was recommended.     Assessment:     Poorly controlled gastroesophageal reflux  Suspected sleep apnea.    Plan:     Indications for upper endoscopy and possible dilatation of a stricture was identified were reviewed.  Risks of the procedure were discussed including injury to the esophagus.  The patient reports he is no longer making use of anti-inflammatories which is certainly in his favor.  He does not smoke.  He has not noted a correlation between his ingestion of beer and worsening symptoms although mixed drinks because  severe retrosternal pain.  I suspect that he has a strong component of sleep apnea aggravating his situation, and a home sleep study would be of benefit.  Possibility of atypical chest pain in a patient with severe lipid disorder also needs to be considered.    This note is partially prepared by Ledell Noss, CMA acting as a scribe in the presence of Dr. Hervey Ard, MD.   The documentation recorded by the scribe accurately reflects the service I personally performed and the decisions made by me.   Robert Bellow, MD FACS

## 2020-12-14 ENCOUNTER — Ambulatory Visit: Payer: BC Managed Care – PPO | Admitting: Physician Assistant

## 2020-12-15 ENCOUNTER — Encounter: Payer: Self-pay | Admitting: General Surgery

## 2020-12-15 ENCOUNTER — Ambulatory Visit: Payer: BC Managed Care – PPO | Admitting: Certified Registered Nurse Anesthetist

## 2020-12-15 ENCOUNTER — Other Ambulatory Visit: Payer: Self-pay

## 2020-12-15 ENCOUNTER — Ambulatory Visit
Admission: RE | Admit: 2020-12-15 | Discharge: 2020-12-15 | Disposition: A | Discharge status: Home / Self Care | Payer: BC Managed Care – PPO | Source: Ambulatory Visit | Attending: General Surgery | Admitting: General Surgery

## 2020-12-15 ENCOUNTER — Encounter
Admission: RE | Disposition: A | Discharge status: Home / Self Care | Payer: Self-pay | Source: Ambulatory Visit | Attending: General Surgery

## 2020-12-15 DIAGNOSIS — Z7951 Long term (current) use of inhaled steroids: Secondary | ICD-10-CM | POA: Diagnosis not present

## 2020-12-15 DIAGNOSIS — Z791 Long term (current) use of non-steroidal anti-inflammatories (NSAID): Secondary | ICD-10-CM | POA: Diagnosis not present

## 2020-12-15 DIAGNOSIS — Z888 Allergy status to other drugs, medicaments and biological substances status: Secondary | ICD-10-CM | POA: Insufficient documentation

## 2020-12-15 DIAGNOSIS — K2100 Gastro-esophageal reflux disease with esophagitis, without bleeding: Secondary | ICD-10-CM | POA: Insufficient documentation

## 2020-12-15 DIAGNOSIS — I10 Essential (primary) hypertension: Secondary | ICD-10-CM | POA: Insufficient documentation

## 2020-12-15 DIAGNOSIS — Z79899 Other long term (current) drug therapy: Secondary | ICD-10-CM | POA: Diagnosis not present

## 2020-12-15 DIAGNOSIS — Z87891 Personal history of nicotine dependence: Secondary | ICD-10-CM | POA: Diagnosis not present

## 2020-12-15 DIAGNOSIS — R1310 Dysphagia, unspecified: Secondary | ICD-10-CM | POA: Diagnosis not present

## 2020-12-15 DIAGNOSIS — K449 Diaphragmatic hernia without obstruction or gangrene: Secondary | ICD-10-CM | POA: Insufficient documentation

## 2020-12-15 DIAGNOSIS — J45909 Unspecified asthma, uncomplicated: Secondary | ICD-10-CM | POA: Diagnosis not present

## 2020-12-15 HISTORY — PX: ESOPHAGOGASTRODUODENOSCOPY (EGD) WITH PROPOFOL: SHX5813

## 2020-12-15 SURGERY — ESOPHAGOGASTRODUODENOSCOPY (EGD) WITH PROPOFOL
Anesthesia: General

## 2020-12-15 MED ORDER — PROPOFOL 500 MG/50ML IV EMUL
INTRAVENOUS | Status: DC | PRN
  Administered 2020-12-15: 175 ug/kg/min via INTRAVENOUS

## 2020-12-15 MED ORDER — PROPOFOL 10 MG/ML IV BOLUS
INTRAVENOUS | Status: DC | PRN
  Administered 2020-12-15: 80 mg via INTRAVENOUS
  Administered 2020-12-15: 50 mg via INTRAVENOUS
  Administered 2020-12-15: 20 mg via INTRAVENOUS

## 2020-12-15 MED ORDER — SODIUM CHLORIDE 0.9 % IV SOLN: INTRAVENOUS | Status: DC

## 2020-12-15 MED ORDER — LIDOCAINE HCL (CARDIAC) PF 100 MG/5ML IV SOSY
PREFILLED_SYRINGE | INTRAVENOUS | Status: DC | PRN
  Administered 2020-12-15: 40 mg via INTRAVENOUS

## 2020-12-15 NOTE — Anesthesia Postprocedure Evaluation (Signed)
Anesthesia Post Note  Patient: Matthew Salinas  Procedure(s) Performed: ESOPHAGOGASTRODUODENOSCOPY (EGD) WITH PROPOFOL (N/A )  Patient location during evaluation: Phase II Anesthesia Type: General Level of consciousness: awake and alert, awake and oriented Pain management: pain level controlled Vital Signs Assessment: post-procedure vital signs reviewed and stable Respiratory status: spontaneous breathing, nonlabored ventilation and respiratory function stable Cardiovascular status: blood pressure returned to baseline and stable Postop Assessment: no apparent nausea or vomiting Anesthetic complications: no   No complications documented.   Last Vitals:  Vitals:   12/15/20 0931 12/15/20 0936  BP:  106/85  Pulse: 81 81  Resp: 14 15  Temp:    SpO2: 95% 99%    Last Pain:  Vitals:   12/15/20 0917  TempSrc: Temporal  PainSc:                  Phill Mutter

## 2020-12-15 NOTE — Anesthesia Preprocedure Evaluation (Signed)
Anesthesia Evaluation  Patient identified by MRN, date of birth, ID band Patient awake    Reviewed: Allergy & Precautions, NPO status , Patient's Chart, lab work & pertinent test results  Airway Mallampati: III  TM Distance: >3 FB Neck ROM: Full    Dental no notable dental hx.    Pulmonary neg pulmonary ROS, former smoker,    Pulmonary exam normal        Cardiovascular hypertension, negative cardio ROS Normal cardiovascular exam     Neuro/Psych Anxiety  Neuromuscular disease negative psych ROS   GI/Hepatic Neg liver ROS, GERD  Poorly Controlled and Medicated,  Endo/Other  negative endocrine ROS  Renal/GU negative Renal ROS  negative genitourinary   Musculoskeletal negative musculoskeletal ROS (+)   Abdominal   Peds negative pediatric ROS (+)  Hematology negative hematology ROS (+)   Anesthesia Other Findings . GERD (gastroesophageal reflux disease)  . Hyperlipidemia  . Hypertension  . IBS (irritable bowel syndrome)  . Traumatic pneumothorax 12/04/2015 . Vitamin D deficiency     Reproductive/Obstetrics negative OB ROS                             Anesthesia Physical Anesthesia Plan  ASA: II  Anesthesia Plan: General   Post-op Pain Management:    Induction: Intravenous  PONV Risk Score and Plan: 2 and Propofol infusion and TIVA  Airway Management Planned: Natural Airway and Nasal Cannula  Additional Equipment:   Intra-op Plan:   Post-operative Plan:   Informed Consent: I have reviewed the patients History and Physical, chart, labs and discussed the procedure including the risks, benefits and alternatives for the proposed anesthesia with the patient or authorized representative who has indicated his/her understanding and acceptance.       Plan Discussed with: CRNA, Anesthesiologist and Surgeon  Anesthesia Plan Comments:         Anesthesia Quick Evaluation

## 2020-12-15 NOTE — Op Note (Signed)
[facility] Gastroenterology Patient Name: Matthew Salinas Procedure Date: 12/15/2020 8:55 AM MRN: [id number] Account #: [id number] Date of Birth: 26-Jun-1966 Admit Type: Outpatient Age: 55 Room: [facility] ENDO ROOM 1 Gender: Male Note Status: Finalized Procedure:             Upper GI endoscopy Indications:           Suspected gastro-esophageal reflux disease Providers:             Robert Bellow, MD Referring MD:          Unk Pinto (Referring MD) Medicines:             Propofol per Anesthesia Complications:         No immediate complications. Procedure:             Pre-Anesthesia Assessment:                        - Prior to the procedure, a History and Physical was                         performed, and patient medications, allergies and                         sensitivities were reviewed. The patient's tolerance                         of previous anesthesia was reviewed.                        - The risks and benefits of the procedure and the                         sedation options and risks were discussed with the                         patient. All questions were answered and informed                         consent was obtained.                        After obtaining informed consent, the endoscope was                         passed under direct vision. Throughout the procedure,                         the patient's blood pressure, pulse, and oxygen                         saturations were monitored continuously. The Endoscope                         was introduced through the mouth, and advanced to the                         second part of duodenum. The upper GI endoscopy was  accomplished without difficulty. The patient tolerated                         the procedure well. Findings:      A small hiatal hernia was present. Biopsies were taken with a cold       forceps for histology.      The stomach was normal.      The  examined duodenum was normal. Impression:            - Small hiatal hernia. Biopsied.                        - Normal stomach.                        - Normal examined duodenum. Recommendation:        - Telephone endoscopist for pathology results in 1                         week. Procedure Code(s):     --- Professional ---                        [phone], Esophagogastroduodenoscopy, flexible,                         transoral; with biopsy, single or multiple Diagnosis Code(s):     --- Professional ---                        K44.9, Diaphragmatic hernia without obstruction or                         gangrene CPT copyright 2019 American Medical Association. All rights reserved. The codes documented in this report are preliminary and upon coder review may  be revised to meet current compliance requirements. Robert Bellow, MD 12/15/2020 9:13:25 AM This report has been signed electronically. Number of Addenda: 0 Note Initiated On: 12/15/2020 8:55 AM Estimated Blood Loss:  Estimated blood loss was minimal.      [facility]

## 2020-12-15 NOTE — Anesthesia Procedure Notes (Signed)
Date/Time: 12/15/2020 9:01 AM Performed by: Johnna Acosta, CRNA Pre-anesthesia Checklist: Patient identified, Emergency Drugs available, Suction available, Patient being monitored and Timeout performed Patient Re-evaluated:Patient Re-evaluated prior to induction Oxygen Delivery Method: Nasal cannula Preoxygenation: Pre-oxygenation with 100% oxygen Induction Type: IV induction

## 2020-12-15 NOTE — Transfer of Care (Signed)
Immediate Anesthesia Transfer of Care Note  Patient: Matthew Salinas  Procedure(s) Performed: ESOPHAGOGASTRODUODENOSCOPY (EGD) WITH PROPOFOL (N/A )  Patient Location: PACU  Anesthesia Type:General  Level of Consciousness: awake  Airway & Oxygen Therapy: Patient Spontanous Breathing  Post-op Assessment: Report given to RN and Post -op Vital signs reviewed and stable  Post vital signs: Reviewed and stable  Last Vitals:  Vitals Value Taken Time  BP 109/71 12/15/20 0917  Temp 36.4 C 12/15/20 0917  Pulse 84 12/15/20 0919  Resp 20 12/15/20 0919  SpO2 96 % 12/15/20 0919  Vitals shown include unvalidated device data.  Last Pain:  Vitals:   12/15/20 0917  TempSrc: Temporal  PainSc:          Complications: No complications documented.

## 2020-12-15 NOTE — H&P (Signed)
Matthew Salinas [id number] August 31, 1965     HPI:  55 y/o with symptomatic GERD on PPI. For EGD.   Medications Prior to Admission  Medication Sig Dispense Refill Last Dose  . ALPRAZolam (XANAX) 1 MG tablet Take  1/2 to 1 tablet  2 to 3 x / day  ONLY  if needed for Anxiety Attack & please try to limit to 5 days /week to avoid Addiction & Dementia 50 tablet 0 12/14/2020 at Unknown time  . cholecalciferol (VITAMIN D) 1000 UNITS tablet Take 5,000 Units by mouth daily.    Past Month at Unknown time  . ezetimibe (ZETIA) 10 MG tablet Take 1 tablet Daily for Cholesterol 90 tablet 3 Past Month at Unknown time  . famotidine (PEPCID) 20 MG tablet Take 1 tablet (20 mg total) by mouth 2 (two) times daily. 60 tablet 1 12/14/2020 at Unknown time  . lidocaine-prilocaine (EMLA) cream APPLY TOPICALLY AS NEEDED 90 g 11 Past Month at Unknown time  . lisinopril-hydrochlorothiazide (ZESTORETIC) 20-25 MG tablet Take 1 tablet     Daily     for BP & Fluid Retention / Ankle Swelling 90 tablet 0 12/14/2020 at Unknown time  . meloxicam (MOBIC) 15 MG tablet Take 1/2 to 1 tablet Daily with Food for Pain & Inflammation (Patient taking differently: as needed. Take 1/2 to 1 tablet Daily with Food for Pain & Inflammation) 90 tablet 0 Past Month at Unknown time  . montelukast (SINGULAIR) 10 MG tablet Take 1 tablet Daily for Allergies & asthma 90 tablet 0 Past Month at Unknown time  . Multiple Vitamins-Minerals (MULTIVITAMIN PO) Take by mouth.   Past Month at Unknown time  . PROCTO-MED HC 2.5 % rectal cream APPLY CREAM  RECTALLY 4 TIMES DAILY 56 g 3 12/14/2020 at Unknown time  . simvastatin (ZOCOR) 20 MG tablet Take      1 tablet       at Bedtime        for Cholesterol 900 tablet 0 Past Month at Unknown time  . sucralfate (CARAFATE) 1 g tablet Dissolve 1 tab in 2-3 ounces of fluid 4 times a day 30 min prior to meals and bedtime. 120 tablet 1 Past Month at Unknown time  . tiZANidine (ZANAFLEX) 4 MG tablet Take 0.5-1 tablets (2-4 mg  total) by mouth every 8 (eight) hours as needed for muscle spasms. 90 tablet 0 Past Month at Unknown time  . omeprazole (PRILOSEC) 40 MG capsule Take      1 capsule      Daily      to Prevent Heartburn & Indigestion (Patient not taking: Reported on 12/15/2020) 90 capsule 0 Not Taking at Unknown time   Allergies  Allergen Reactions  . Lipitor [Atorvastatin]     Myalgias  . Prozac [Fluoxetine Hcl]   . Rosuvastatin     myalgias  . Simvastatin     Myalgias   Past Medical History:  Diagnosis Date  . Allergy   . GERD (gastroesophageal reflux disease)   . Hyperlipidemia   . Hypertension   . IBS (irritable bowel syndrome)   . Traumatic pneumothorax 12/04/2015  . Vitamin D deficiency    Past Surgical History:  Procedure Laterality Date  . CHEST TUBE INSERTION Right 05-15-02   thoracostomy  . TOE SURGERY  2008   right big toe   Social History   Socioeconomic History  . Marital status: Divorced    Spouse name: Not on file  . Number of children: Not on file  .  Years of education: Not on file  . Highest education level: Not on file  Occupational History  . Not on file  Tobacco Use  . Smoking status: Former Smoker    Quit date: 08/05/2001    Years since quitting: 19.3  . Smokeless tobacco: Never Used  . Tobacco comment: Smoked 5-10 cigarettes per day since age 20  Substance and Sexual Activity  . Alcohol use: Yes    Alcohol/week: 10.0 standard drinks    Types: 10 Standard drinks or equivalent per week  . Drug use: No  . Sexual activity: Not on file  Other Topics Concern  . Not on file  Social History Narrative  . Not on file   Social Determinants of Health   Financial Resource Strain: Not on file  Food Insecurity: Not on file  Transportation Needs: Not on file  Physical Activity: Not on file  Stress: Not on file  Social Connections: Not on file  Intimate Partner Violence: Not on file   Social History   Social History Narrative  . Not on file     ROS: Negative.      PE: HEENT: Negative. Lungs: Clear. Cardio: RR.  Assessment/Plan:  Proceed with planned endoscopy.   Forest Gleason [facility] 12/15/2020

## 2020-12-18 ENCOUNTER — Encounter: Payer: Self-pay | Admitting: General Surgery

## 2020-12-18 LAB — SURGICAL PATHOLOGY

## 2021-01-02 ENCOUNTER — Other Ambulatory Visit: Payer: Self-pay | Admitting: Internal Medicine

## 2021-01-02 DIAGNOSIS — F419 Anxiety disorder, unspecified: Secondary | ICD-10-CM

## 2021-01-02 MED ORDER — ALPRAZOLAM 1 MG PO TABS: ORAL_TABLET | ORAL | 0 refills | Status: DC

## 2021-01-05 ENCOUNTER — Encounter: Payer: BC Managed Care – PPO | Admitting: Internal Medicine

## 2021-01-24 ENCOUNTER — Encounter: Payer: Self-pay | Admitting: Internal Medicine

## 2021-01-24 ENCOUNTER — Other Ambulatory Visit: Payer: Self-pay

## 2021-01-24 ENCOUNTER — Ambulatory Visit (INDEPENDENT_AMBULATORY_CARE_PROVIDER_SITE_OTHER): Payer: BC Managed Care – PPO | Admitting: Internal Medicine

## 2021-01-24 DIAGNOSIS — R350 Frequency of micturition: Secondary | ICD-10-CM

## 2021-01-24 DIAGNOSIS — Z136 Encounter for screening for cardiovascular disorders: Secondary | ICD-10-CM | POA: Diagnosis not present

## 2021-01-24 DIAGNOSIS — E559 Vitamin D deficiency, unspecified: Secondary | ICD-10-CM | POA: Diagnosis not present

## 2021-01-24 DIAGNOSIS — Z0001 Encounter for general adult medical examination with abnormal findings: Secondary | ICD-10-CM

## 2021-01-24 DIAGNOSIS — Z0000 Encounter for general adult medical examination without abnormal findings: Secondary | ICD-10-CM

## 2021-01-24 DIAGNOSIS — G720 Drug-induced myopathy: Secondary | ICD-10-CM

## 2021-01-24 DIAGNOSIS — N401 Enlarged prostate with lower urinary tract symptoms: Secondary | ICD-10-CM | POA: Diagnosis not present

## 2021-01-24 DIAGNOSIS — Z1329 Encounter for screening for other suspected endocrine disorder: Secondary | ICD-10-CM

## 2021-01-24 DIAGNOSIS — Z13220 Encounter for screening for lipoid disorders: Secondary | ICD-10-CM | POA: Diagnosis not present

## 2021-01-24 DIAGNOSIS — Z79899 Other long term (current) drug therapy: Secondary | ICD-10-CM | POA: Diagnosis not present

## 2021-01-24 DIAGNOSIS — Z87891 Personal history of nicotine dependence: Secondary | ICD-10-CM

## 2021-01-24 DIAGNOSIS — I10 Essential (primary) hypertension: Secondary | ICD-10-CM

## 2021-01-24 DIAGNOSIS — Z131 Encounter for screening for diabetes mellitus: Secondary | ICD-10-CM | POA: Diagnosis not present

## 2021-01-24 DIAGNOSIS — Z1211 Encounter for screening for malignant neoplasm of colon: Secondary | ICD-10-CM

## 2021-01-24 DIAGNOSIS — Z130 Encounter for screening for diseases of the blood and blood-forming organs and certain disorders involving the immune mechanism: Secondary | ICD-10-CM

## 2021-01-24 DIAGNOSIS — Z111 Encounter for screening for respiratory tuberculosis: Secondary | ICD-10-CM

## 2021-01-24 DIAGNOSIS — K219 Gastro-esophageal reflux disease without esophagitis: Secondary | ICD-10-CM

## 2021-01-24 DIAGNOSIS — Z1389 Encounter for screening for other disorder: Secondary | ICD-10-CM

## 2021-01-24 DIAGNOSIS — Z125 Encounter for screening for malignant neoplasm of prostate: Secondary | ICD-10-CM | POA: Diagnosis not present

## 2021-01-24 DIAGNOSIS — R5383 Other fatigue: Secondary | ICD-10-CM

## 2021-01-24 DIAGNOSIS — R7309 Other abnormal glucose: Secondary | ICD-10-CM

## 2021-01-24 DIAGNOSIS — Z8249 Family history of ischemic heart disease and other diseases of the circulatory system: Secondary | ICD-10-CM

## 2021-01-24 DIAGNOSIS — E782 Mixed hyperlipidemia: Secondary | ICD-10-CM

## 2021-01-24 NOTE — Progress Notes (Signed)
Annual  Screening/Preventative Visit  & Comprehensive Evaluation & Examination  Future Appointments  Date Time Provider [place]  01/24/2022 11:00 AM Unk Pinto, MD [facility] None            This very nice 55 y.o.male presents for a Screening /Preventative Visit & comprehensive evaluation and management of multiple medical co-morbidities.  Patient has been followed for HTN, HLD, T2_NIDDM  Prediabetes and Vitamin D Deficiency.       HTN predates circa 2005. Patient's BP has been controlled at home.  Today's BP is at goal - 112/88. Patient denies any cardiac symptoms as chest pain, palpitations, shortness of breath, dizziness or ankle swelling.       Patient's hyperlipidemia is not controlled with diet and intolerance to multiple statins. Patient denies myalgias or other medication SE's. Last lipids were not at goal:  Lab Results  Component Value Date   CHOL 251 (H) 09/20/2020   HDL 63 09/20/2020   LDLCALC 149 (H) 09/20/2020   TRIG 252 (H) 09/20/2020   CHOLHDL 4.0 09/20/2020         Patient is followed expectantly for glucose intolerance  and patient denies reactive hypoglycemic symptoms, visual blurring, diabetic polys or paresthesias. Last A1c was normal & at goal:   Lab Results  Component Value Date   HGBA1C 5.4 06/12/2020          Finally, patient has history of Vitamin D Deficiency ("34" /2016) and last vitamin D was at goal:   Lab Results  Component Value Date   VD25OH 65 09/20/2020     Current Outpatient Medications on File Prior to Visit  Medication Sig   ALPRAZolam (XANAX) 1 MG tablet Take  1/2 to 1 tablet  2 to 3 x / day  ONLY  if needed    VITAMIN D 5,000 Units  Take  daily.    famotidine 20 MG tablet Take 1 tablet  2 times daily.   lidocaine-prilocaine (EMLA) cream APPLY TOPICALLY AS NEEDED   lisinopril-hctz 20-25 MG tablet Take 1 tablet     Daily    montelukast 10 MG tablet Take 1 tablet Daily for Allergies & asthma   Multiple  Vitamins-Minerals  Take by mouth.   omeprazole 40 MG capsule Take1 capsule Daily    PROCTO-MED HC 2.5 % rectal crm APPLY CREAM  RECTALLY 4 TIMES DAILY   simvastatin (ZOCOR) 20 MG tablet Take  1 tablet at Bedtime    tiZANidine (ZANAFLEX) 4 MG tablet Take 0.5-1 tablets  every 8hours as needed     Allergies  Allergen Reactions   Lipitor Myalgias   Prozac  Myalgias   Rosuvastatin Myalgias   Simvastatin Myalgias    Past Medical History:  Diagnosis Date   Allergy    GERD (gastroesophageal reflux disease)    Hyperlipidemia    Hypertension    IBS (irritable bowel syndrome)    Traumatic pneumothorax 12/04/2015   Vitamin D deficiency     Health Maintenance  Topic Date Due   COVID-19 Vaccine (1) Never done   Pneumococcal Vaccine  Never done   Zoster Vaccines- Shingrix (1 of 2) Never done   INFLUENZA VACCINE  03/05/2021   COLONOSCOPY  06/27/2025   TETANUS/TDAP  03/27/2026   Hepatitis C Screening  Completed   HIV Screening  Completed   HPV VACCINES  Aged Out     Immunization History  Administered Date(s) Administered   PPD Test 04/28/2017, 06/29/2018, 10/28/2019   Pneumococcal-23 01/06/2012   Td  06/10/2006   Tdap 03/27/2016    Last Colon - 06/28/2015 - Dr Silverio Decamp - Recc 10 year f/u due Dec 2026   Past Surgical History:  Procedure Laterality Date   CHEST TUBE INSERTION Right 05-15-02   thoracostomy   ESOPHAGOGASTRODUODENOSCOPY (EGD)  N/A 12/15/2020   ESOPHAGOGASTRODUODENOSCOPY (EGD) ;  Surgeon: Robert Bellow, MD   TOE SURGERY  2008   right big toe     Family History  Problem Relation Age of Onset   Cancer Mother        lung   Hyperlipidemia Mother    Hypertension Father    Hyperlipidemia Father    Dementia Brother    Hyperlipidemia Brother    Hyperlipidemia Brother    Hyperlipidemia Brother    Colon cancer Maternal Grandmother    Diabetes Maternal Grandfather     Social History   Socioeconomic History   Marital status: Divorced   Number of  children: Not on file  Occupational History   Not on file  Tobacco Use   Smoking status: Former    Pack years: 0.00    Types: Cigarettes    Quit date: 08/05/2001    Years since quitting: 19.4   Smokeless tobacco: Never   Tobacco comments:    Smoked 5-10 cigarettes per day since age 47  Substance and Sexual Activity   Alcohol use: Yes    Alcohol/week: 10.0 standard drinks    Types: 10 Standard drinks or equivalent per week   Drug use: No   Sexual activity: Not on file    ROS Constitutional: Denies fever, chills, weight loss/gain, headaches, insomnia,  night sweats or change in appetite. Does c/o fatigue. Eyes: Denies redness, blurred vision, diplopia, discharge, itchy or watery eyes.  ENT: Denies discharge, congestion, post nasal drip, epistaxis, sore throat, earache, hearing loss, dental pain, Tinnitus, Vertigo, Sinus pain or snoring.  Cardio: Denies chest pain, palpitations, irregular heartbeat, syncope, dyspnea, diaphoresis, orthopnea, PND, claudication or edema Respiratory: denies cough, dyspnea, DOE, pleurisy, hoarseness, laryngitis or wheezing.  Gastrointestinal: Denies dysphagia, heartburn, reflux, water brash, pain, cramps, nausea, vomiting, bloating, diarrhea, constipation, hematemesis, melena, hematochezia, jaundice or hemorrhoids Genitourinary: Denies dysuria, frequency, urgency, nocturia, hesitancy, discharge, hematuria or flank pain Musculoskeletal: Denies arthralgia, myalgia, stiffness, Jt. Swelling, pain, limp or strain/sprain. Denies Falls. Skin: Denies puritis, rash, hives, warts, acne, eczema or change in skin lesion Neuro: No weakness, tremor, incoordination, spasms, paresthesia or pain Psychiatric: Denies confusion, memory loss or sensory loss. Denies Depression. Endocrine: Denies change in weight, skin, hair change, nocturia, and paresthesia, diabetic polys, visual blurring or hyper / hypo glycemic episodes.  Heme/Lymph: No excessive bleeding, bruising or enlarged  lymph nodes.   Physical Exam  BP (P) 112/88   Pulse (P) 97   Temp (P) 97.7 F (36.5 C)   Resp (P) 17   Ht (P) 5\' 11"  (1.803 m)   Wt (P) 217 lb (98.4 kg)   SpO2 (P) 99%   BMI (P) 30.27 kg/m   General Appearance: Well nourished and well groomed and in no apparent distress.  Eyes: PERRLA, EOMs, conjunctiva no swelling or erythema, normal fundi and vessels. Sinuses: No frontal/maxillary tenderness ENT/Mouth: EACs patent / TMs  nl. Nares clear without erythema, swelling, mucoid exudates. Oral hygiene is good. No erythema, swelling, or exudate. Tongue normal, non-obstructing. Tonsils not swollen or erythematous. Hearing normal.  Neck: Supple, thyroid not palpable. No bruits, nodes or JVD. Respiratory: Respiratory effort normal.  BS equal and clear bilateral without rales, rhonci, wheezing or  stridor. Cardio: Heart sounds are normal with regular rate and rhythm and no murmurs, rubs or gallops. Peripheral pulses are normal and equal bilaterally without edema. No aortic or femoral bruits. Chest: symmetric with normal excursions and percussion.  Abdomen: Soft, with Nl bowel sounds. Nontender, no guarding, rebound, hernias, masses, or organomegaly.  Lymphatics: Non tender without lymphadenopathy.  Musculoskeletal: Full ROM all peripheral extremities, joint stability, 5/5 strength, and normal gait. Skin: Warm and dry without rashes, lesions, cyanosis, clubbing or  ecchymosis.  Neuro: Cranial nerves intact, reflexes equal bilaterally. Normal muscle tone, no cerebellar symptoms. Sensation intact.  Pysch: Alert and oriented X 3 with normal affect, insight and judgment appropriate.   Assessment and Plan  1. Annual Preventative/Screening Exam    2. Essential hypertension  - EKG 12-Lead - [place], RETROPERITNL ABD,  LTD - Urinalysis, Routine w reflex microscopic - Microalbumin / creatinine urine ratio - TSH  3. Hyperlipidemia, mixed  - EKG 12-Lead - [place], RETROPERITNL ABD,  LTD - Lipid  panel - TSH  4. Abnormal glucose  - EKG 12-Lead - [place], RETROPERITNL ABD,  LTD - Hemoglobin A1c - Insulin, random  5. Vitamin D deficiency  - VITAMIN D 25 Hydroxy   6. Statin myopathy   7. Gastroesophageal reflux disease without esophagitis  - CBC with Differential/Platelet  8. Screening for colorectal cancer  - POC Hemoccult Bld/Stl                                                                                                               9. Prostate cancer screening  - PSA  10. Screening-pulmonary TB  - TB Skin Test  11. Screening for ischemic heart disease  - EKG 12-Lead  12. FHx: heart disease  - EKG 12-Lead - [place], RETROPERITNL ABD,  LTD  13. Former smoker  - EKG 12-Lead - [place], RETROPERITNL ABD,  LTD  14. Screening for AAA (aortic abdominal aneurysm)  - [place], RETROPERITNL ABD,  LTD  15. Fatigue, - Iron, Total/Total Iron Binding Cap - Vitamin B12 - Testosterone  16. Medication management  - Urinalysis, Routine w reflex microscopic - Microalbumin / creatinine urine ratio - CBC with Differential/Platelet - COMPLETE METABOLIC PANEL WITH GFR - Magnesium - Lipid panel - TSH - Hemoglobin A1c - Insulin, random - VITAMIN D 25 Hydroxy          Patient was counseled in prudent diet, weight control to achieve/maintain BMI less than 25, BP monitoring, regular exercise and medications as discussed.  Discussed med effects and SE's. Routine screening labs and tests as requested with regular follow-up as recommended. Over 40 minutes of exam, counseling, chart review and high complex critical decision making was performed   Kirtland Bouchard, MD

## 2021-01-24 NOTE — Patient Instructions (Signed)

## 2021-01-24 NOTE — Progress Notes (Addendum)
Annual  Screening/Preventative Visit  & Comprehensive Evaluation & Examination  Future Appointments  Date Time Provider [place]  01/24/2021 11:00 AM Unk Pinto, MD [facility] None  01/24/2022 11:00 AM Unk Pinto, MD [facility] None            This very nice 55 y.o.male presents for a Screening /Preventative Visit & comprehensive evaluation and management of multiple medical co-morbidities.  Patient has been followed for HTN, HLD, T2_NIDDM  Prediabetes and Vitamin D Deficiency.       HTN predates since 2005. Patient's BP has been controlled at home.  Today's BP is at goal - 112/88. Patient denies any cardiac symptoms as chest pain, palpitations, shortness of breath, dizziness or ankle swelling.       Patient's hyperlipidemia is controlled with diet and medications. Patient denies myalgias or other medication SE's. Last lipids were  Lab Results  Component Value Date   CHOL 264 (H) 01/24/2021   HDL 61 01/24/2021   LDLCALC 143 (H) 01/24/2021   TRIG 389 (H) 01/24/2021   CHOLHDL 4.3 01/24/2021         Patient has hx/o T2_NIDDM prediabetes since    and patient denies reactive hypoglycemic symptoms, visual blurring, diabetic polys or paresthesias. Last A1c was   Lab Results  Component Value Date   HGBA1C 5.3 01/24/2021          Finally, patient has history of Vitamin D Deficiency of    and last vitamin D was   Lab Results  Component Value Date   VD25OH 47 01/24/2021     Current Outpatient Medications on File Prior to Visit  Medication Sig   cholecalciferol (VITAMIN D) 1000 UNITS tablet Take 5,000 Units by mouth daily.    famotidine (PEPCID) 20 MG tablet Take 1 tablet (20 mg total) by mouth 2 (two) times daily.   lidocaine-prilocaine (EMLA) cream APPLY TOPICALLY AS NEEDED   Multiple Vitamins-Minerals (MULTIVITAMIN PO) Take by mouth.   PROCTO-MED HC 2.5 % rectal cream APPLY CREAM  RECTALLY 4 TIMES DAILY      Allergies  Allergen Reactions    Lipitor [Atorvastatin]     Myalgias   Prozac [Fluoxetine Hcl]    Rosuvastatin     myalgias   Simvastatin     Myalgias     Past Medical History:  Diagnosis Date   Allergy    GERD (gastroesophageal reflux disease)    Hyperlipidemia    Hypertension    IBS (irritable bowel syndrome)    Traumatic pneumothorax 12/04/2015   Vitamin D deficiency      Health Maintenance  Topic Date Due   COVID-19 Vaccine (1) Never done   Zoster Vaccines- Shingrix (1 of 2) Never done   INFLUENZA VACCINE  Never done   COLONOSCOPY (Pts 45-69yrs Insurance coverage will need to be confirmed)  06/27/2025   TETANUS/TDAP  03/27/2026   Hepatitis C Screening  Completed   HIV Screening  Completed   HPV VACCINES  Aged Out     Immunization History  Administered Date(s) Administered   PPD Test  06/29/2018, 10/28/2019, 01/24/2021   Pneumococcal-23 01/06/2012   Td 06/10/2006   Tdap 03/27/2016   06/28/2015  - Colonoscopy - Dr Silverio Decamp - Recc 10 year f/u due Dec 2026  Past Surgical History:  Procedure Laterality Date   CHEST TUBE INSERTION Right 05-15-02   thoracostomy   ESOPHAGOGASTRODUODENOSCOPY (EGD) WITH PROPOFOL N/A 12/15/2020   Procedure: ESOPHAGOGASTRODUODENOSCOPY (EGD) WITH PROPOFOL;  Surgeon: Robert Bellow, MD;  Location:  [facility];  Service: Endoscopy;  Laterality: N/A;  with possible dilation   TOE SURGERY  2008   right big toe     Family History  Problem Relation Age of Onset   Cancer Mother        lung   Hyperlipidemia Mother    Hypertension Father    Hyperlipidemia Father    Dementia Brother    Hyperlipidemia Brother    Hyperlipidemia Brother    Hyperlipidemia Brother    Colon cancer Maternal Grandmother    Diabetes Maternal Grandfather     Social History   Socioeconomic History   Marital status: Divorced    Spouse name: Not on file   Number of children: Not on file   Years of education: Not on file   Highest education level: Not on file  Occupational History    Not on file  Tobacco Use   Smoking status: Former    Types: Cigarettes    Quit date: 08/05/2001    Years since quitting: 19.7   Smokeless tobacco: Never   Tobacco comments:    Smoked 5-10 cigarettes per day since age 84  Substance and Sexual Activity   Alcohol use: Yes    Alcohol/week: 10.0 standard drinks    Types: 10 Standard drinks or equivalent per week   Drug use: No   Sexual activity: Not on file    ROS Constitutional: Denies fever, chills, weight loss/gain, headaches, insomnia,  night sweats or change in appetite. Does c/o fatigue. Eyes: Denies redness, blurred vision, diplopia, discharge, itchy or watery eyes.  ENT: Denies discharge, congestion, post nasal drip, epistaxis, sore throat, earache, hearing loss, dental pain, Tinnitus, Vertigo, Sinus pain or snoring.  Cardio: Denies chest pain, palpitations, irregular heartbeat, syncope, dyspnea, diaphoresis, orthopnea, PND, claudication or edema Respiratory: denies cough, dyspnea, DOE, pleurisy, hoarseness, laryngitis or wheezing.  Gastrointestinal: Denies dysphagia, heartburn, reflux, water brash, pain, cramps, nausea, vomiting, bloating, diarrhea, constipation, hematemesis, melena, hematochezia, jaundice or hemorrhoids Genitourinary: Denies dysuria, frequency, urgency, nocturia, hesitancy, discharge, hematuria or flank pain Musculoskeletal: Denies arthralgia, myalgia, stiffness, Jt. Swelling, pain, limp or strain/sprain. Denies Falls. Skin: Denies puritis, rash, hives, warts, acne, eczema or change in skin lesion Neuro: No weakness, tremor, incoordination, spasms, paresthesia or pain Psychiatric: Denies confusion, memory loss or sensory loss. Denies Depression. Endocrine: Denies change in weight, skin, hair change, nocturia, and paresthesia, diabetic polys, visual blurring or hyper / hypo glycemic episodes.  Heme/Lymph: No excessive bleeding, bruising or enlarged lymph nodes.   Physical Exam  BP (P) 112/88   Pulse (P) 97    Temp (P) 97.7 F (36.5 C)   Resp (P) 17   Ht (P) 5\' 11"  (1.803 m)   Wt (P) 217 lb (98.4 kg)   SpO2 (P) 99%   BMI (P) 30.27 kg/m   General Appearance: Well nourished and well groomed and in no apparent distress.  Eyes: PERRLA, EOMs, conjunctiva no swelling or erythema, normal fundi and vessels. Sinuses: No frontal/maxillary tenderness ENT/Mouth: EACs patent / TMs  nl. Nares clear without erythema, swelling, mucoid exudates. Oral hygiene is good. No erythema, swelling, or exudate. Tongue normal, non-obstructing. Tonsils not swollen or erythematous. Hearing normal.  Neck: Supple, thyroid not palpable. No bruits, nodes or JVD. Respiratory: Respiratory effort normal.  BS equal and clear bilateral without rales, rhonci, wheezing or stridor. Cardio: Heart sounds are normal with regular rate and rhythm and no murmurs, rubs or gallops. Peripheral pulses are normal and equal bilaterally without edema. No aortic or femoral  bruits. Chest: symmetric with normal excursions and percussion.  Abdomen: Soft, with Nl bowel sounds. Nontender, no guarding, rebound, hernias, masses, or organomegaly.  Lymphatics: Non tender without lymphadenopathy.  Musculoskeletal: Full ROM all peripheral extremities, joint stability, 5/5 strength, and normal gait. Skin: Warm and dry without rashes, lesions, cyanosis, clubbing or  ecchymosis.  Neuro: Cranial nerves intact, reflexes equal bilaterally. Normal muscle tone, no cerebellar symptoms. Sensation intact.  Pysch: Alert and oriented X 3 with normal affect, insight and judgment appropriate.   Assessment and Plan  1. Annual Preventative/Screening Exam    2. Essential hypertension  - EKG 12-Lead - [place], RETROPERITNL ABD,  LTD - Urinalysis, Routine w reflex microscopic - Microalbumin / creatinine urine ratio - TSH  3. Hyperlipidemia, mixed  - EKG 12-Lead - [place], RETROPERITNL ABD,  LTD - Lipid panel - TSH  4. Abnormal glucose  - EKG 12-Lead - [place], RETROPERITNL  ABD,  LTD - Hemoglobin A1c - Insulin, random  5. Vitamin D deficiency  - VITAMIN D 25 Hydroxy   6. Statin myopathy   7. Gastroesophageal reflux disease   - CBC with Differential/Platelet  8. Screening for colorectal cancer  - POC Hemoccult Bld/Stl   9. Prostate cancer screening  - PSA  10. Screening-pulmonary TB  - TB Skin Test  11. Screening for ischemic heart disease  - EKG 12-Lead  12. FHx: heart disease  - EKG 12-Lead - [place], RETROPERITNL ABD,  LTD  13. Former smoker  - EKG 12-Lead - [place], RETROPERITNL ABD,  LTD  14. Screening for AAA (aortic abdominal aneurysm)  - [place], RETROPERITNL ABD,  LTD  15. Fatigue, unspecified type  - Iron, Total/Total Iron Binding Cap - Vitamin B12 - Testosterone  16. Medication management  - Urinalysis, Routine w reflex microscopic - Microalbumin / creatinine urine ratio - CBC with Differential/Platelet - COMPLETE METABOLIC PANEL WITH GFR - Magnesium - Lipid panel - TSH - Hemoglobin A1c - Insulin, random - VITAMIN D 25 Hydroxy - MICROSCOPIC MESSAGE          Patient was counseled in prudent diet, weight control to achieve/maintain BMI less than 25, BP monitoring, regular exercise and medications as discussed.  Discussed med effects and SE's. Routine screening labs and tests as requested with regular follow-up as recommended. Over 40 minutes of exam, counseling, chart review and high complex critical decision making was performed   Kirtland Bouchard, MD

## 2021-01-25 ENCOUNTER — Other Ambulatory Visit: Payer: Self-pay | Admitting: Internal Medicine

## 2021-01-25 LAB — CBC WITH DIFFERENTIAL/PLATELET
Absolute Monocytes: 462 cells/uL (ref 200–950)
Basophils Absolute: 60 cells/uL (ref 0–200)
Basophils Relative: 1 %
Eosinophils Absolute: 60 cells/uL (ref 15–500)
Eosinophils Relative: 1 %
HCT: 46 % (ref 38.5–50.0)
Hemoglobin: 15.3 g/dL (ref 13.2–17.1)
Lymphs Abs: 1620 cells/uL (ref 850–3900)
MCH: 31 pg (ref 27.0–33.0)
MCHC: 33.3 g/dL (ref 32.0–36.0)
MCV: 93.3 fL (ref 80.0–100.0)
MPV: 11.3 fL (ref 7.5–12.5)
Monocytes Relative: 7.7 %
Neutro Abs: 3798 cells/uL (ref 1500–7800)
Neutrophils Relative %: 63.3 %
Platelets: 282 10*3/uL (ref 140–400)
RBC: 4.93 10*6/uL (ref 4.20–5.80)
RDW: 12.1 % (ref 11.0–15.0)
Total Lymphocyte: 27 %
WBC: 6 10*3/uL (ref 3.8–10.8)

## 2021-01-25 LAB — MICROALBUMIN / CREATININE URINE RATIO
Creatinine, Urine: 202 mg/dL (ref 20–320)
Microalb Creat Ratio: 11 mcg/mg creat (ref <30)
Microalb, Ur: 2.2 mg/dL

## 2021-01-25 LAB — COMPLETE METABOLIC PANEL WITH GFR
AG Ratio: 1.7 (calc) (ref 1.0–2.5)
ALT: 37 U/L (ref 9–46)
AST: 36 U/L — ABNORMAL HIGH (ref 10–35)
Albumin: 4.7 g/dL (ref 3.6–5.1)
Alkaline phosphatase (APISO): 60 U/L (ref 35–144)
BUN: 11 mg/dL (ref 7–25)
CO2: 27 mmol/L (ref 20–32)
Calcium: 10 mg/dL (ref 8.6–10.3)
Chloride: 99 mmol/L (ref 98–110)
Creat: 0.85 mg/dL (ref 0.70–1.33)
GFR, Est African American: 113 mL/min/{1.73_m2} (ref >=60)
GFR, Est Non African American: 97 mL/min/{1.73_m2} (ref >=60)
Globulin: 2.7 g/dL (calc) (ref 1.9–3.7)
Glucose, Bld: 103 mg/dL — ABNORMAL HIGH (ref 65–99)
Potassium: 4.6 mmol/L (ref 3.5–5.3)
Sodium: 138 mmol/L (ref 135–146)
Total Bilirubin: 1 mg/dL (ref 0.2–1.2)
Total Protein: 7.4 g/dL (ref 6.1–8.1)

## 2021-01-25 LAB — TSH: TSH: 1.28 mIU/L (ref 0.40–4.50)

## 2021-01-25 LAB — URINALYSIS, ROUTINE W REFLEX MICROSCOPIC
Bacteria, UA: NONE SEEN /HPF
Bilirubin Urine: NEGATIVE
Glucose, UA: NEGATIVE
Hgb urine dipstick: NEGATIVE
Hyaline Cast: NONE SEEN /LPF
Ketones, ur: NEGATIVE
Leukocytes,Ua: NEGATIVE
Nitrite: NEGATIVE
RBC / HPF: NONE SEEN /HPF (ref 0–2)
Specific Gravity, Urine: 1.023 (ref 1.001–1.035)
Squamous Epithelial / HPF: NONE SEEN /HPF (ref <=5)
WBC, UA: NONE SEEN /HPF (ref 0–5)
pH: 6.5 (ref 5.0–8.0)

## 2021-01-25 LAB — LIPID PANEL
Cholesterol: 264 mg/dL — ABNORMAL HIGH (ref <200)
HDL: 61 mg/dL (ref >=40)
LDL Cholesterol (Calc): 143 mg/dL (calc) — ABNORMAL HIGH
Non-HDL Cholesterol (Calc): 203 mg/dL (calc) — ABNORMAL HIGH (ref <130)
Total CHOL/HDL Ratio: 4.3 (calc) (ref <5.0)
Triglycerides: 389 mg/dL — ABNORMAL HIGH (ref <150)

## 2021-01-25 LAB — MAGNESIUM: Magnesium: 2.1 mg/dL (ref 1.5–2.5)

## 2021-01-25 LAB — HEMOGLOBIN A1C
Hgb A1c MFr Bld: 5.3 % of total Hgb (ref <5.7)
Mean Plasma Glucose: 105 mg/dL
eAG (mmol/L): 5.8 mmol/L

## 2021-01-25 LAB — MICROSCOPIC MESSAGE

## 2021-01-25 LAB — TESTOSTERONE: Testosterone: 647 ng/dL (ref 250–827)

## 2021-01-25 LAB — IRON, TOTAL/TOTAL IRON BINDING CAP
%SAT: 54 % (calc) — ABNORMAL HIGH (ref 20–48)
Iron: 195 ug/dL — ABNORMAL HIGH (ref 50–180)
TIBC: 364 mcg/dL (calc) (ref 250–425)

## 2021-01-25 LAB — PSA: PSA: 0.73 ng/mL (ref <=4.00)

## 2021-01-25 LAB — INSULIN, RANDOM: Insulin: 5.8 u[IU]/mL

## 2021-01-25 LAB — VITAMIN D 25 HYDROXY (VIT D DEFICIENCY, FRACTURES): Vit D, 25-Hydroxy: 47 ng/mL (ref 30–100)

## 2021-01-25 LAB — VITAMIN B12: Vitamin B-12: 373 pg/mL (ref 200–1100)

## 2021-01-25 MED ORDER — PITAVASTATIN CALCIUM 4 MG PO TABS: ORAL_TABLET | ORAL | 3 refills | Status: DC

## 2021-01-25 NOTE — Progress Notes (Signed)
============================================================ ============================================================  - Iron levels are elevated  - This should be rechecked in 6 months !  ============================================================ ============================================================  -  Vitamin B12 =  373     -     Very Low  (Ideal or Goal Vit B12 is between 450 - 1,100)   Low Vit B12 may be associated with Anemia , Fatigue,   Peripheral Neuropathy, Dementia, "Brain Fog", & Depression  - Recommend take a sub-lingual form of Vitamin B12 tablet   1,000 to 5,000 mcg tab that you dissolve under your tongue /Daily   - Can get Baron Sane - best price at [employer] or on [employer] ============================================================ ============================================================  -  PSA - is Low - Great  ============================================================ ============================================================  -  Total Chol = 264 - Dangerously high risk for Heart Attack, Stroke  or Developing Vascular Dementia             (  Ideal or Goal is  less than 180 !  )    - Bad /Dangerous LDL Chol = 143 - also Dangerously High              (  Ideal or Goal is less than 70  !  )                                             - Sent in new Rx to your Drugstore for Pitavastatin ============================================================ ============================================================  -  Also Triglycerides (   389  ) or fats in blood are way too high                     (goal is less than 150)    - Recommend avoid fried & greasy foods,  sweets / candy,   - Avoid white rice  (brown or wild rice or Quinoa is OK),   - Avoid white potatoes  (sweet potatoes are OK)   - Avoid anything made from white flour  - bagels, doughnuts, rolls, buns, biscuits, white and   wheat breads, pizza crust and traditional  pasta  made of white flour & egg white  - (vegetarian pasta or spinach or wheat pasta is OK).    - Multi-grain bread is OK - like multi-grain flat bread or  sandwich thins.   - Avoid alcohol in excess.   - Exercise is also important.  ============================================================ ============================================================  -  A1c - Normal - Great - No Diabetes  ============================================================ ============================================================  -  Vitamin D = 47   -  Low   - Vitamin D goal is between 70-100.   :::::::::::::::::::::::::::::::::::::::::::::::::::::::::::::::::::::::::::::::::::::::::::::::::::::::::::::::::::::::::::::  - Please INCREASE  your Vitamin D 5,000 units to                                                                 take 2 capsules = 10,000 units /daily   :::::::::::::::::::::::::::::::::::::::::::::::::::::::::::::::::::::::::::::::::::::::::::::::::::::::::::::::::::::::::::::  - It is very important as a natural anti-inflammatory and helping the  immune system protect against viral infections, like the Covid-19    helping hair, skin, and nails, as well as reducing stroke and  heart attack risk.   - It  helps your bones and helps with mood.  - It also decreases numerous cancer risks so please  take it as directed.   - Low Vit D is associated with a 200-300% higher risk for  CANCER   and 200-300% higher risk for HEART   ATTACK  &  STROKE.    - It is also associated with higher death rate at younger ages,   autoimmune diseases like Rheumatoid arthritis, Lupus,  Multiple Sclerosis.     - Also many other serious conditions, like depression, Alzheimer's  Dementia, infertility, muscle aches, fatigue, fibromyalgia   - just to name a few. ============================================================ ============================================================  -  All Else -  CBC - Kidneys - Electrolytes - Liver - Magnesium & Thyroid    - all  Normal / OK ============================================================  ============================================================

## 2021-01-26 NOTE — Progress Notes (Signed)
Pt was called to discuss lab results. Pt did not answer the phone so a message was left for him to call the office. 01/26/21 DG/[facility]

## 2021-02-06 ENCOUNTER — Telehealth: Payer: Self-pay

## 2021-02-06 NOTE — Telephone Encounter (Signed)
Pt was called to discuss his lab results. Pt states he will try to follow his directions. 02/06/21 DG/[facility]

## 2021-02-09 ENCOUNTER — Telehealth: Payer: Self-pay | Admitting: Internal Medicine

## 2021-02-12 ENCOUNTER — Other Ambulatory Visit: Payer: Self-pay | Admitting: Internal Medicine

## 2021-02-12 DIAGNOSIS — M159 Polyosteoarthritis, unspecified: Secondary | ICD-10-CM

## 2021-02-14 NOTE — Telephone Encounter (Signed)
error 

## 2021-03-11 ENCOUNTER — Other Ambulatory Visit: Payer: Self-pay | Admitting: Internal Medicine

## 2021-03-11 DIAGNOSIS — J3089 Other allergic rhinitis: Secondary | ICD-10-CM

## 2021-03-11 MED ORDER — MONTELUKAST SODIUM 10 MG PO TABS: ORAL_TABLET | ORAL | 3 refills | Status: DC

## 2021-04-02 ENCOUNTER — Other Ambulatory Visit: Payer: Self-pay | Admitting: Internal Medicine

## 2021-04-02 DIAGNOSIS — K219 Gastro-esophageal reflux disease without esophagitis: Secondary | ICD-10-CM

## 2021-04-23 ENCOUNTER — Other Ambulatory Visit: Payer: Self-pay | Admitting: Internal Medicine

## 2021-04-23 DIAGNOSIS — F419 Anxiety disorder, unspecified: Secondary | ICD-10-CM

## 2021-04-23 MED ORDER — ALPRAZOLAM 1 MG PO TABS: ORAL_TABLET | ORAL | 0 refills | Status: DC

## 2021-04-30 NOTE — Progress Notes (Signed)
Future Appointments  Date Time Provider [place]  05/01/2021  4:00 PM Unk Pinto, MD [facility] None  05/07/2021  9:00 AM Dohmeier, Asencion Partridge, MD [facility] None  07/11/2021  9:30 AM Magda Bernheim, NP [facility] None  01/24/2022 11:00 AM Unk Pinto, MD [facility] None    History of Present Illness:       This very nice 55 y.o. [name] presents for 3 month follow up with HTN, HLD, Pre-Diabetes and Vitamin D Deficiency.  Patient was evaluated  by Dr Brett Fairy and sleep study showed only snoring w/o apnea and he declined f/u or referral for an oral appliance.       Patient is treated for HTN (2005) & BP has been controlled at home. Today's BP is at goal - 130/90. Patient has had no complaints of any cardiac type chest pain, palpitations, dyspnea / orthopnea / PND, dizziness, claudication, or dependent edema.       Hyperlipidemia is not controlled with diet & he was started on  Simvastatin. Patient denies myalgias or other med SE's. Last Lipids were not at goal & patient was started on Simvastatin .   Lab Results  Component Value Date   CHOL 264 (H) 01/24/2021   HDL 61 01/24/2021   LDLCALC 143 (H) 01/24/2021   TRIG 389 (H) 01/24/2021   CHOLHDL 4.3 01/24/2021     Also, the patient  and has had no symptoms of reactive hypoglycemia, diabetic polys, paresthesias or visual blurring.  Last A1c was   Lab Results  Component Value Date   HGBA1C 5.3 01/24/2021        Further, the patient also has history of Vitamin D Deficiency ("34" /2016) and supplements vitamin D without any suspected side-effects. Last vitamin D was still low:   Last vitamin D Lab Results  Component Value Date   VD25OH 47 01/24/2021     Current Outpatient Medications on File Prior to Visit  Medication Sig   ALPRAZolam (XANAX) 1 MG tablet Take  1/2 to 1 tablet  2 to 3 x / day  ONLY  if needed    VITAMIN D 1000 UNITS  Take 5,000 Units daily.    famotidine 20 MG tablet Take 1 tablet 2  times  daily.   lisinopril-hctz 20-25 MG tablet TAKE 1 TABLET  DAILY    montelukast 10 MG tablet Take 1 tablet Daily for Allergies & Asthma    Multiple Vitamins-Minerals  Take by mouth.   omeprazole 40 MG capsule TAKE 1 CAPSULE DAILY   Simvastatin 20  MG TABS Take  1 tablet  Daily  for Cholesterol   PROCTO-MED HC 2.5 % rectal crm APPLY CREAM  RECTALLY 4 TIMES DAILY      Allergies  Allergen Reactions   Lipitor [Atorvastatin]     Myalgias   Prozac [Fluoxetine Hcl]    Rosuvastatin     myalgias   Simvastatin     Myalgias     PMHx:   Past Medical History:  Diagnosis Date   Allergy    GERD (gastroesophageal reflux disease)    Hyperlipidemia    Hypertension    IBS (irritable bowel syndrome)    Traumatic pneumothorax 12/04/2015   Vitamin D deficiency      Immunization History  Administered Date(s) Administered   PPD Test 01/19/2014, 03/27/2016, 04/28/2017, 06/29/2018, 10/28/2019, 01/24/2021   Pneumococcal-Unspecified 01/06/2012   Td 06/10/2006   Tdap 03/27/2016     Past Surgical History:  Procedure Laterality Date  CHEST TUBE INSERTION Right 05-15-02   thoracostomy   ESOPHAGOGASTRODUODENOSCOPY (EGD) WITH PROPOFOL N/A 12/15/2020   Procedure: ESOPHAGOGASTRODUODENOSCOPY (EGD) WITH PROPOFOL;  Surgeon: Robert Bellow, MD;  Location: [facility] ENDOSCOPY;  Service: Endoscopy;  Laterality: N/A;  with possible dilation   TOE SURGERY  2008   right big toe    FHx:    Reviewed / unchanged  SHx:    Reviewed / unchanged   Systems Review:  Constitutional: Denies fever, chills, wt changes, headaches, insomnia, fatigue, night sweats, change in appetite. Eyes: Denies redness, blurred vision, diplopia, discharge, itchy, watery eyes.  ENT: Denies discharge, congestion, post nasal drip, epistaxis, sore throat, earache, hearing loss, dental pain, tinnitus, vertigo, sinus pain, snoring.  CV: Denies chest pain, palpitations, irregular heartbeat, syncope, dyspnea, diaphoresis, orthopnea, PND,  claudication or edema. Respiratory: denies cough, dyspnea, DOE, pleurisy, hoarseness, laryngitis, wheezing.  Gastrointestinal: Denies dysphagia, odynophagia, heartburn, reflux, water brash, abdominal pain or cramps, nausea, vomiting, bloating, diarrhea, constipation, hematemesis, melena, hematochezia  or hemorrhoids. Genitourinary: Denies dysuria, frequency, urgency, nocturia, hesitancy, discharge, hematuria or flank pain. Musculoskeletal: Denies arthralgias, myalgias, stiffness, jt. swelling, pain, limping or strain/sprain.  Skin: Denies pruritus, rash, hives, warts, acne, eczema or change in skin lesion(s). Neuro: No weakness, tremor, incoordination, spasms, paresthesia or pain. Psychiatric: Denies confusion, memory loss or sensory loss. Endo: Denies change in weight, skin or hair change.  Heme/Lymph: No excessive bleeding, bruising or enlarged lymph nodes.  Physical Exam  BP 130/90   Pulse 86   Temp (!) 97.3 F (36.3 C)   Wt 218 lb 9.6 oz (99.2 kg)   SpO2 99%   BMI (P) 30.49 kg/m   Appears  well nourished, well groomed  and in no distress.  Eyes: PERRLA, EOMs, conjunctiva no swelling or erythema. Sinuses: No frontal/maxillary tenderness ENT/Mouth: EAC's clear, TM's nl w/o erythema, bulging. Nares clear w/o erythema, swelling, exudates. Oropharynx clear without erythema or exudates. Oral hygiene is good. Tongue normal, non obstructing. Hearing intact.  Neck: Supple. Thyroid not palpable. Car 2+/2+ without bruits, nodes or JVD. Chest: Respirations nl with BS clear & equal w/o rales, rhonchi, wheezing or stridor.  Cor: Heart sounds normal w/ regular rate and rhythm without sig. murmurs, gallops, clicks or rubs. Peripheral pulses normal and equal  without edema.  Abdomen: Soft & bowel sounds normal. Non-tender w/o guarding, rebound, hernias, masses or organomegaly.  Lymphatics: Unremarkable.  Musculoskeletal: Full ROM all peripheral extremities, joint stability, 5/5 strength and normal  gait.  Skin: Warm, dry without exposed rashes, lesions or ecchymosis apparent.  Neuro: Cranial nerves intact, reflexes equal bilaterally. Sensory-motor testing grossly intact. Tendon reflexes grossly intact.  Pysch: Alert & oriented x 3.  Insight and judgement nl & appropriate. No ideations.  Assessment and Plan:  1. Essential hypertension  - Continue medication, monitor blood pressure at home.  - Continue DASH diet.  Reminder to go to the ER if any CP,  SOB, nausea, dizziness, severe HA, changes vision/speech.   - CBC with Differential/Platelet - COMPLETE METABOLIC PANEL WITH GFR - Magnesium - TSH  2. Hyperlipidemia, mixed  - Continue diet/meds, exercise,& lifestyle modifications.  - Continue monitor periodic cholesterol/liver & renal functions    - Lipid panel - TSH  3. Abnormal glucose  - Continue diet, exercise  - Lifestyle modifications.  - Monitor appropriate labs   - Hemoglobin A1c - Insulin, random  4. Vitamin D deficiency  - Continue supplementation.   - VITAMIN D 25 Hydroxy   5. Medication management   -  CBC with Differential/Platelet - COMPLETE METABOLIC PANEL WITH GFR - Magnesium - Lipid panel - TSH - Hemoglobin A1c - Insulin, random - VITAMIN D 25 Hydroxy           Discussed  regular exercise, BP monitoring, weight control to achieve/maintain BMI less than 25 and discussed med and SE's. Recommended labs to assess and monitor clinical status with further disposition pending results of labs.  I discussed the assessment and treatment plan with the patient. The patient was provided an opportunity to ask questions and all were answered. The patient agreed with the plan and demonstrated an understanding of the instructions.  I provided over 30 minutes of exam, counseling, chart review and  complex critical decision making.        The patient was advised to call back or seek an in-person evaluation if the symptoms worsen or if the condition fails to  improve as anticipated.   Kirtland Bouchard, MD

## 2021-05-01 ENCOUNTER — Encounter: Payer: Self-pay | Admitting: Internal Medicine

## 2021-05-01 ENCOUNTER — Other Ambulatory Visit: Payer: Self-pay

## 2021-05-01 ENCOUNTER — Ambulatory Visit: Payer: BC Managed Care – PPO | Admitting: Internal Medicine

## 2021-05-01 VITALS — BP 130/90 | HR 86 | Temp 97.3°F | Wt 218.6 lb

## 2021-05-01 DIAGNOSIS — E782 Mixed hyperlipidemia: Secondary | ICD-10-CM | POA: Diagnosis not present

## 2021-05-01 DIAGNOSIS — I10 Essential (primary) hypertension: Secondary | ICD-10-CM

## 2021-05-01 DIAGNOSIS — E559 Vitamin D deficiency, unspecified: Secondary | ICD-10-CM | POA: Diagnosis not present

## 2021-05-01 DIAGNOSIS — R7309 Other abnormal glucose: Secondary | ICD-10-CM | POA: Diagnosis not present

## 2021-05-01 DIAGNOSIS — Z79899 Other long term (current) drug therapy: Secondary | ICD-10-CM

## 2021-05-01 MED ORDER — TRAZODONE HCL 150 MG PO TABS: ORAL_TABLET | ORAL | 0 refills | Status: DC

## 2021-05-02 ENCOUNTER — Other Ambulatory Visit: Payer: Self-pay | Admitting: Internal Medicine

## 2021-05-02 LAB — LIPID PANEL
Cholesterol: 238 mg/dL — ABNORMAL HIGH (ref <200)
HDL: 71 mg/dL (ref >=40)
LDL Cholesterol (Calc): 125 mg/dL (calc) — ABNORMAL HIGH
Non-HDL Cholesterol (Calc): 167 mg/dL (calc) — ABNORMAL HIGH (ref <130)
Total CHOL/HDL Ratio: 3.4 (calc) (ref <5.0)
Triglycerides: 272 mg/dL — ABNORMAL HIGH (ref <150)

## 2021-05-02 LAB — COMPLETE METABOLIC PANEL WITH GFR
AG Ratio: 1.9 (calc) (ref 1.0–2.5)
ALT: 25 U/L (ref 9–46)
AST: 23 U/L (ref 10–35)
Albumin: 4.7 g/dL (ref 3.6–5.1)
Alkaline phosphatase (APISO): 55 U/L (ref 35–144)
BUN: 11 mg/dL (ref 7–25)
CO2: 27 mmol/L (ref 20–32)
Calcium: 9.8 mg/dL (ref 8.6–10.3)
Chloride: 100 mmol/L (ref 98–110)
Creat: 0.91 mg/dL (ref 0.70–1.30)
Globulin: 2.5 g/dL (calc) (ref 1.9–3.7)
Glucose, Bld: 91 mg/dL (ref 65–99)
Potassium: 4.7 mmol/L (ref 3.5–5.3)
Sodium: 137 mmol/L (ref 135–146)
Total Bilirubin: 0.8 mg/dL (ref 0.2–1.2)
Total Protein: 7.2 g/dL (ref 6.1–8.1)
eGFR: 98 mL/min/{1.73_m2} (ref >=60)

## 2021-05-02 LAB — CBC WITH DIFFERENTIAL/PLATELET
Absolute Monocytes: 511 cells/uL (ref 200–950)
Basophils Absolute: 52 cells/uL (ref 0–200)
Basophils Relative: 0.7 %
Eosinophils Absolute: 67 cells/uL (ref 15–500)
Eosinophils Relative: 0.9 %
HCT: 43.4 % (ref 38.5–50.0)
Hemoglobin: 14.6 g/dL (ref 13.2–17.1)
Lymphs Abs: 2050 cells/uL (ref 850–3900)
MCH: 31.8 pg (ref 27.0–33.0)
MCHC: 33.6 g/dL (ref 32.0–36.0)
MCV: 94.6 fL (ref 80.0–100.0)
MPV: 11 fL (ref 7.5–12.5)
Monocytes Relative: 6.9 %
Neutro Abs: 4721 cells/uL (ref 1500–7800)
Neutrophils Relative %: 63.8 %
Platelets: 278 10*3/uL (ref 140–400)
RBC: 4.59 10*6/uL (ref 4.20–5.80)
RDW: 11.8 % (ref 11.0–15.0)
Total Lymphocyte: 27.7 %
WBC: 7.4 10*3/uL (ref 3.8–10.8)

## 2021-05-02 LAB — VITAMIN D 25 HYDROXY (VIT D DEFICIENCY, FRACTURES): Vit D, 25-Hydroxy: 62 ng/mL (ref 30–100)

## 2021-05-02 LAB — MAGNESIUM: Magnesium: 2 mg/dL (ref 1.5–2.5)

## 2021-05-02 LAB — HEMOGLOBIN A1C
Hgb A1c MFr Bld: 5.1 % of total Hgb (ref <5.7)
Mean Plasma Glucose: 100 mg/dL
eAG (mmol/L): 5.5 mmol/L

## 2021-05-02 LAB — TSH: TSH: 1.41 mIU/L (ref 0.40–4.50)

## 2021-05-02 LAB — INSULIN, RANDOM: Insulin: 4.9 u[IU]/mL

## 2021-05-02 MED ORDER — ROSUVASTATIN CALCIUM 20 MG PO TABS: ORAL_TABLET | ORAL | 3 refills | Status: DC

## 2021-05-02 NOTE — Progress Notes (Signed)
============================================================ -   Test results slightly outside the reference range are not unusual. If there is anything important, I will review this with you,  otherwise it is considered normal test values.  If you have further questions,  please do not hesitate to contact me at the office or via My Chart.  ============================================================ ============================================================  -  Total Chol = 238 - way too high         (  Ideal or Goal is less than 180  !  )  - and   - Bad /Dangerous LDL chol = 125 - also way too high -          (  Ideal or Goal is less than 70  !  )   - Recommend stop the Simvastatin   - Sent in new Rx for Crestor 20 mg to take 1 tablet Daily for Cholesterol ============================================================ ============================================================  -  Also Triglycerides (     ) or fats in blood are too high  (goal is less than 150)    - Recommend avoid fried & greasy foods,  sweets / candy,   - Avoid white rice  (brown or wild rice or Quinoa is OK),   - Avoid white potatoes  (sweet potatoes are OK)   - Avoid anything made from white flour  - bagels, doughnuts, rolls, buns, biscuits, white and   wheat breads, pizza crust and traditional  pasta made of white flour & egg white  - (vegetarian pasta or spinach or wheat pasta is OK).    - Multi-grain bread is OK - like multi-grain flat bread or  sandwich thins.   - Avoid alcohol in excess.   - Exercise is also important. ============================================================ ============================================================  -  A1c is Normal - No Diabetes  - Great ! ============================================================ ============================================================  -  Vitamin D = 62 - Excellent  Please keep dose same   ============================================================ ============================================================  -  All Else - CBC - Kidneys - Electrolytes - Liver - Magnesium & Thyroid    - all  Normal / OK ============================================================ ============================================================

## 2021-05-07 ENCOUNTER — Encounter: Payer: Self-pay | Admitting: Neurology

## 2021-05-07 ENCOUNTER — Ambulatory Visit: Payer: BC Managed Care – PPO | Admitting: Neurology

## 2021-05-07 VITALS — BP 124/82 | HR 99 | Ht 71.0 in | Wt 220.0 lb

## 2021-05-07 DIAGNOSIS — R0683 Snoring: Secondary | ICD-10-CM | POA: Insufficient documentation

## 2021-05-07 DIAGNOSIS — G4700 Insomnia, unspecified: Secondary | ICD-10-CM | POA: Diagnosis not present

## 2021-05-07 DIAGNOSIS — J351 Hypertrophy of tonsils: Secondary | ICD-10-CM | POA: Diagnosis not present

## 2021-05-07 DIAGNOSIS — T17308A Unspecified foreign body in larynx causing other injury, initial encounter: Secondary | ICD-10-CM | POA: Insufficient documentation

## 2021-05-07 DIAGNOSIS — G478 Other sleep disorders: Secondary | ICD-10-CM | POA: Diagnosis not present

## 2021-05-07 NOTE — Progress Notes (Signed)
SLEEP MEDICINE CLINIC    Provider:  Larey Seat, MD  Primary Care Physician:  Unk Pinto, MD [address] [place] [address]     Referring Provider: Unk Pinto, [place],  [place] [address]          Chief Complaint according to patient   Patient presents with:     New Patient (Initial Visit)           HISTORY OF PRESENT ILLNESS:  Matthew Salinas is a 55 y.o. year old Caucasian male patient seen here as a referral on 05/07/2021 from Dr Melford Aase for a new sleep consultation..  Chief concern according to patient :     I have the pleasure of seeing Matthew Salinas today, a right -handed Caucasian male with a known sleep disorder.      He has a past medical history of Allergy, GERD (gastroesophageal reflux disease), Hyperlipidemia, Hypertension, IBS (irritable bowel syndrome), Traumatic pneumothorax (12/04/2015), and Vitamin D deficiency..  He has very large tonsils, and he does sometimes have a choking episode when sleeping on his back.  His wife has also noticed snoring and apneic breathing.  He had 2 rotator cuff tears and cannot sleep easily on his side for that reason.   The patient had the first sleep study in the year 2017  with a result of an AHI ( Apnea Hypopnea index)  of 1.5,   Sleep relevant medical history: he can't sleep easily in a lab and wants now a HST-   Family medical /sleep history: No other family member on CPAP with OSA, insomnia, sleep walkers.    Social history:  Patient is working as a Administrator , and lives in a household spouse, children are gone. The patient currently works part time-. Pets are present. Tobacco use- quit 20 years ago. Marland Kitchen  ETOH use rare ,  Caffeine intake in form of Coffee( 1 up a day) Soda( /) Tea ( for dinner ) or energy drinks. Regular exercise in form of .   Hobbies :Careers information officer.       Sleep habits are as follows:  The patient's dinner time is between  7 PM. The patient goes to bed at 9.30 PM and continues to sleep for intervals of 1-2 hours-  hours, wakes from pain in shoulders. Has 1-2 bathroom breaks, the first time at 2 AM.   The preferred sleep position is supine, with the support of 1-2 pillows. Dreams are reportedly frequent/vivid.  6  AM is the usual rise time. The patient wakes up spontaneously .  He reports not feeling refreshed or restored in AM, with symptoms such as dry mouth, morning headaches, and stiffness, residual fatigue. Naps are taken infrequently, seldomly.   Review of Systems: Out of a complete 14 system review, the patient complains of only the following symptoms, and all other reviewed systems are negative.:  Fatigue, sleepiness , snoring, fragmented sleep, Insomnia mind racing, pain , choking.    How likely are you to doze in the following situations: 0 = not likely, 1 = slight chance, 2 = moderate chance, 3 = high chance   Sitting and Reading? Watching Television? Sitting inactive in a public place (theater or meeting)? As a passenger in a car for an hour without a break? Lying down in the afternoon when circumstances permit? Sitting and talking to someone? Sitting quietly after lunch without alcohol? In a car, while stopped for  a few minutes in traffic?   Total = 3/ 24 points   FSS endorsed at 19/ 63 points.   Social History   Socioeconomic History   Marital status: Divorced    Spouse name: Not on file   Number of children: Not on file   Years of education: Not on file   Highest education level: Not on file  Occupational History   Not on file  Tobacco Use   Smoking status: Former    Types: Cigarettes    Quit date: 08/05/2001    Years since quitting: 19.7   Smokeless tobacco: Never   Tobacco comments:    Smoked 5-10 cigarettes per day since age 4  Substance and Sexual Activity   Alcohol use: Yes    Alcohol/week: 10.0 standard drinks    Types: 10 Standard drinks or equivalent per week   Drug  use: No   Sexual activity: Not on file  Other Topics Concern   Not on file  Social History Narrative   Not on file   Social Determinants of Health   Financial Resource Strain: Not on file  Food Insecurity: Not on file  Transportation Needs: Not on file  Physical Activity: Not on file  Stress: Not on file  Social Connections: Not on file    Family History  Problem Relation Age of Onset   Cancer Mother        lung   Hyperlipidemia Mother    Hypertension Father    Hyperlipidemia Father    Dementia Brother    Hyperlipidemia Brother    Hyperlipidemia Brother    Hyperlipidemia Brother    Colon cancer Maternal Grandmother    Diabetes Maternal Grandfather     Past Medical History:  Diagnosis Date   Allergy    GERD (gastroesophageal reflux disease)    Hyperlipidemia    Hypertension    IBS (irritable bowel syndrome)    Traumatic pneumothorax 12/04/2015   Vitamin D deficiency     Past Surgical History:  Procedure Laterality Date   CHEST TUBE INSERTION Right 05-15-02   thoracostomy   ESOPHAGOGASTRODUODENOSCOPY (EGD) WITH PROPOFOL N/A 12/15/2020   Procedure: ESOPHAGOGASTRODUODENOSCOPY (EGD) WITH PROPOFOL;  Surgeon: Robert Bellow, MD;  Location: [facility] ENDOSCOPY;  Service: Endoscopy;  Laterality: N/A;  with possible dilation   TOE SURGERY  2008   right big toe     Current Outpatient Medications on File Prior to Visit  Medication Sig Dispense Refill   ALPRAZolam (XANAX) 1 MG tablet Take  1/2 to 1 tablet  2 to 3 x / day  ONLY  if needed for Anxiety Attack & please try to limit to 5 days /week to avoid Addiction & Dementia 45 tablet 0   cholecalciferol (VITAMIN D) 1000 UNITS tablet Take 5,000 Units by mouth daily.      famotidine (PEPCID) 20 MG tablet Take 1 tablet (20 mg total) by mouth 2 (two) times daily. 60 tablet 1   lidocaine-prilocaine (EMLA) cream APPLY TOPICALLY AS NEEDED 90 g 11   lisinopril-hydrochlorothiazide (ZESTORETIC) 20-25 MG tablet TAKE 1 TABLET BY MOUTH  ONCE DAILY (FOR  BLOOD  PRESSURE  AND  FLUID  RETENTION/ANKLE  SWELLING) 90 tablet 1   montelukast (SINGULAIR) 10 MG tablet Take 1 tablet Daily for Allergies & Asthma / Patient knows to take by mouth 90 tablet 3   Multiple Vitamins-Minerals (MULTIVITAMIN PO) Take by mouth.     Omega-3 Fatty Acids (FISH OIL PO) Take by mouth.     omeprazole (  PRILOSEC) 40 MG capsule TAKE 1 CAPSULE BY MOUTH DAILY TO PREVENT HEARTBURN AND INDIGESTION 90 capsule 1   PROCTO-MED HC 2.5 % rectal cream APPLY CREAM  RECTALLY 4 TIMES DAILY 56 g 3   rosuvastatin (CRESTOR) 20 MG tablet Take 1 tablet Daily for Cholesterol 90 tablet 3   traZODone (DESYREL) 150 MG tablet Take  1/2 to 1 tablet  1 hour  before Bedtime  as needed for Sleep 30 tablet 0   No current facility-administered medications on file prior to visit.    Allergies  Allergen Reactions   Lipitor [Atorvastatin]     Myalgias   Prozac [Fluoxetine Hcl]    Rosuvastatin     myalgias   Simvastatin     Myalgias    Physical exam:  Today's Vitals   05/07/21 0842  BP: 124/82  Pulse: 99  Weight: 220 lb (99.8 kg)  Height: 5\' 11"  (1.803 m)   Body mass index is 30.68 kg/m.   Wt Readings from Last 3 Encounters:  05/07/21 220 lb (99.8 kg)  05/01/21 218 lb 9.6 oz (99.2 kg)  01/24/21 (P) 217 lb (98.4 kg)     Ht Readings from Last 3 Encounters:  05/07/21 5\' 11"  (1.803 m)  01/24/21 (P) 5\' 11"  (1.803 m)  12/15/20 5\' 11"  (1.803 m)      General: The patient is awake, alert and appears not in acute distress. The patient is well groomed. Head: Normocephalic, atraumatic. Neck is supple. Mallampati 2- but with tonsils that kiss the uvula. Small oral opening.    neck circumference:18 inches . Nasal airflow  patent.  Retrognathia is  seen.  Dental status:  Cardiovascular:  Regular rate and cardiac rhythm by pulse,  without distended neck veins. Respiratory: Lungs are clear to auscultation.  Skin:  Without evidence of ankle edema, or rash. Trunk: The patient's  posture is erect.   Neurologic exam : The patient is awake and alert, oriented to place and time.   Memory subjective described as intact.  Attention span & concentration ability appears normal.  Speech is fluent,  without  dysarthria, dysphonia or aphasia.  Mood and affect are appropriate.   Cranial nerves: no loss of smell or taste reported  Pupils are equal and briskly reactive to light. Funduscopic exam deferred.  Extraocular movements in vertical and horizontal planes were intact and without nystagmus. No Diplopia. Visual fields by finger perimetry are intact. Hearing was intact to soft voice and finger rubbing.    Facial sensation intact to fine touch.  Facial motor strength is symmetric and tongue and uvula move midline.  Neck ROM : rotation, tilt and flexion extension were normal for age and shoulder shrug was symmetrical.    Motor exam:  Symmetric bulk, tone and ROM.   Normal tone without cog wheeling, symmetric grip strength .   Sensory:  Fine touch, pinprick and vibration were tested  and  normal.  Proprioception tested in the upper extremities was normal.   Coordination: Rapid alternating movements in the fingers/hands were of normal speed.  The Finger-to-nose maneuver was intact without evidence of ataxia, dysmetria or tremor.   Gait and station: Patient could rise unassisted from a seated position, walked without assistive device.  Stance is of normal width/ base a.  Toe and heel walk were deferred.  Deep tendon reflexes: in the  upper and lower extremities are symmetric and intact.  Babinski response was deferred.       After spending a total time of  45  minutes face to face and additional time for physical and neurologic examination, review of laboratory studies,  personal review of imaging studies, reports and results of other testing and review of referral information / records as far as provided in visit, I have established the following assessments:  1)  by  description of Mrs. Cozzens she has witnessed clearly choking episodes some pausing in breathing and she describes her husband's sleep is very restless.  He does not have trouble as much initiating sleep and staying asleep and part of this is pain which restricts his ability to find a comfortable position.  Using tizanidine has helped to extend sleep but it also gave him a very groggy feeling the next morning so it is not a long-term solution.  He was just prescribed trazodone as a nonaddictive sleep medication and I certainly would encourage using it.  I mentioned that I think he could be a good candidate still for a tonsillectomy given that he has kissing tonsils. 2) Sleep study to rule out OSA-  3) feeling never rested, racing mind.    My Plan is to proceed with:  1) HST  2) Consider ENT referral , depending on outcome.    I would like to thank Unk Pinto, MD and Unk Pinto, [place],  [place] [address] for allowing me to meet with and to take care of this pleasant patient.   In short, Matthew Salinas is presenting with likely OSA and sleep choking, and insomnia.  I plan to follow up either personally or through our NP within 2-3 month.   CC: I will share my notes with PCP .  Electronically signed by: Larey Seat, MD 05/07/2021 9:08 AM  [facility] Board certified by The [employer] of Sleep Medicine and Diplomate of the [employer] of Sleep Medicine. Board certified In Neurology through the [place], Fellow of the [employer] of Neurology. Medical Director of [facility].

## 2021-05-16 ENCOUNTER — Telehealth: Payer: Self-pay

## 2021-05-16 NOTE — Telephone Encounter (Signed)
LVM for pt to call me back to schedule sleep study  

## 2021-05-21 ENCOUNTER — Ambulatory Visit (INDEPENDENT_AMBULATORY_CARE_PROVIDER_SITE_OTHER): Payer: BC Managed Care – PPO | Admitting: Neurology

## 2021-05-21 DIAGNOSIS — G4700 Insomnia, unspecified: Secondary | ICD-10-CM

## 2021-05-21 DIAGNOSIS — R0683 Snoring: Secondary | ICD-10-CM

## 2021-05-21 DIAGNOSIS — G478 Other sleep disorders: Secondary | ICD-10-CM

## 2021-05-21 DIAGNOSIS — G4733 Obstructive sleep apnea (adult) (pediatric): Secondary | ICD-10-CM

## 2021-05-21 DIAGNOSIS — G4730 Sleep apnea, unspecified: Secondary | ICD-10-CM

## 2021-05-21 DIAGNOSIS — R063 Periodic breathing: Secondary | ICD-10-CM

## 2021-05-21 DIAGNOSIS — T17308A Unspecified foreign body in larynx causing other injury, initial encounter: Secondary | ICD-10-CM

## 2021-05-21 DIAGNOSIS — J351 Hypertrophy of tonsils: Secondary | ICD-10-CM

## 2021-05-28 ENCOUNTER — Ambulatory Visit: Payer: BC Managed Care – PPO | Admitting: Internal Medicine

## 2021-05-28 NOTE — Progress Notes (Signed)
[facility] [place] TEST REPORT ( by Watch PAT)   STUDY DATE:  05-28-21 DOB:  05/13/1966    ORDERING CLINICIAN: Larey Seat, MD  REFERRING CLINICIAN: Dr Melford Aase, MD and [name], NP   CLINICAL INFORMATION/HISTORY: Matthew Salinas today, a right -handed Caucasian male with a known sleep disorder, he can't stay asleep. He prefers to be evaluated by a HST as he is unsure if he can sleep in a lab.   He has a medical history of respiratory Allergy, GERD (gastroesophageal reflux disease), Hyperlipidemia, Hypertension, IBS (irritable bowel syndrome), Traumatic pneumothorax (12/04/2015), and Vitamin D deficiency..  He has very large tonsils, and he does sometimes have a choking episode when sleeping on his back.  His wife has also noticed snoring and apneic breathing.  He had 2 rotator cuff tears and cannot sleep easily on his side for that reason.   Epworth sleepiness score: 3/24. BMI: 30.9 kg/m Neck Circumference: 18   FINDINGS:   Sleep Summary:   Total Recording Time (hours, min): Total recording time amounted to 9 hours and 32 minutes of which the calculated sleep time amounted to 8 hours and 33 minutes.                 Percent REM (%):  33.1%                                    Respiratory Indices:   Calculated pAHI (per hour): The calculated overall AHI was 37.9/h and REM sleep 52/h and in non-REM sleep 30.8/h.  24% of Cheyne-Stokes respiration was noted among these respiratory events.                                       Positional AHI:  In supine sleep position the AHI amounted to 54.5/h in prone sleep 17.5/h and in left-sided sleep the AHI at reached 56.5/h. The patient slept on his right side which is currently hard for him to do due to a shoulder surgery or shoulder injury the AHI was 26.2/h.  The mean snoring volume was 42 dB. Snoring accompanied 46.5% of the sleep recording.                                                 Oxygen Saturation Statistics:     O2 Saturation Range (%): 80% at nadir and 98% maximum, with a mean oxygen saturation 92%.                                       O2 Saturation (minutes) <89%: 23.5 minutes, 4.5 % of sleep time.           Pulse Rate Statistics:    Pulse Range:  61 bpm through 102 bpm, mean heart rate is 75 bpm.                IMPRESSION:  This HST confirms the presence of severe sleep apnea with positional variability. REM sleep accentuated the AHI. Moderate sleep hypoxia was  present. 24% CSR.    RECOMMENDATION: I have concerns about the severity of apnea and the associated 24% CSR, and would prefer an in-lab CPAP titration. Thus will allow [place] to switch from CPAP to BiPAP if needed, or to add oxygen.  The patient seems to prefer prone sleep and it is often difficult to sleep in that position with any CPAP interface. I therefor would ask to use a nasal pillow first.  If the patient is unable to find rest in a sleep laboratory, my Plan B would be an autotitration CPAP device to be initiated at 6-18 cm water pressure  with 3 cm EPR, mask of patient's choice and heated humidification for machine and tubing.      INTERPRETING PHYSICIAN:   Larey Seat, MD   Medical Director of [facility] at [facility].

## 2021-06-05 DIAGNOSIS — G4730 Sleep apnea, unspecified: Secondary | ICD-10-CM | POA: Insufficient documentation

## 2021-06-05 NOTE — Progress Notes (Signed)
RECOMMENDATION: I have concerns about the severity of apnea and the associated 24% CSR and would prefer an in-lab CPAP titration. This will allow [place] to switch from CPAP to BiPAP (if needed, or to add oxygen).  The patient seems to prefer prone sleep and it is often difficult to sleep in that position with any CPAP interface. I therefor would ask to use a nasal pillow first.  If the patient is unable to find rest in a sleep laboratory, my Plan B would be an autotitration CPAP device to be initiated at 6-18 cm water pressure  with 3 cm EPR, mask of patient's choice and heated humidification for machine and tubing.

## 2021-06-05 NOTE — Procedures (Signed)
[facility] [place] TEST REPORT ( by Watch PAT)   STUDY DATE:  05-28-21 DOB:  17-Dec-1965    ORDERING CLINICIAN: Larey Seat, MD  REFERRING CLINICIAN: Dr Melford Aase, MD and [name], NP   CLINICAL INFORMATION/HISTORY: TAIQUAN CAMPANARO today, a right -handed Caucasian male with a known sleep disorder, he can't stay asleep. He prefers to be evaluated by a HST as he is unsure if he can sleep in a lab.   He has a medical history of respiratory Allergy, GERD (gastroesophageal reflux disease), Hyperlipidemia, Hypertension, IBS (irritable bowel syndrome), Traumatic pneumothorax (12/04/2015), and Vitamin D deficiency..  He has very large tonsils, and he does sometimes have a choking episode when sleeping on his back.  His wife has also noticed snoring and apneic breathing.  He had 2 rotator cuff tears and cannot sleep easily on his side for that reason. He has large tonsils and sometimes chokes in his sleep. He uses trazodone and was  trying Xanax for sleep aid.    Epworth sleepiness score: 3/24. BMI: 30.9 kg/m Neck Circumference: 18   FINDINGS:   Sleep Summary:   Total Recording Time (hours, min): Total recording time amounted to 9 hours and 32 minutes of which the calculated sleep time amounted to 8 hours and 33 minutes.                 Percent REM (%):  33.1%                                    Respiratory Indices:   Calculated pAHI (per hour): The calculated overall AHI was 37.9/h and REM sleep 52/h and in non-REM sleep 30.8/h.  24% of Cheyne-Stokes respiration was noted among these respiratory events.                                       Positional AHI:  In supine sleep position the AHI amounted to 54.5/h in prone sleep 17.5/h and in left-sided sleep the AHI at reached 56.5/h. The patient slept on his right side which is currently hard for him to do due to a shoulder surgery or shoulder injury the AHI was 26.2/h.  The mean snoring volume was 42 dB.  Snoring accompanied 46.5% of the sleep recording.                                                Oxygen Saturation Statistics:     O2 Saturation Range (%): 80% at nadir and 98% maximum, with a mean oxygen saturation 92%.                                       O2 Saturation (minutes) <89%: 23.5 minutes, 4.5 % of sleep time.           Pulse Rate Statistics:    Pulse Range:  61 bpm through 102 bpm, mean heart rate is 75 bpm.                IMPRESSION:  This HST confirms the presence  of severe sleep apnea with positional variability. REM sleep accentuated the AHI. Moderate sleep hypoxia was present. 24% CSR.    RECOMMENDATION: I have concerns about the severity of apnea and the associated 24% CSR, and would prefer an in-lab CPAP titration. Thus will allow [place] to switch from CPAP to BiPAP if needed, or to add oxygen.  The patient seems to prefer prone sleep and it is often difficult to sleep in that position with any CPAP interface. I therefor would ask to use a nasal pillow first.  If the patient is unable to find rest in a sleep laboratory, my Plan B would be an autotitration CPAP device to be initiated at 6-18 cm water pressure  with 3 cm EPR, mask of patient's choice and heated humidification for machine and tubing.      INTERPRETING PHYSICIAN:   Larey Seat, MD   Medical Director of [facility] at [facility].

## 2021-06-05 NOTE — Addendum Note (Signed)
Addended by: Larey Seat on: 06/05/2021 06:04 PM   Modules accepted: Orders

## 2021-06-07 ENCOUNTER — Telehealth: Payer: Self-pay | Admitting: Neurology

## 2021-06-07 DIAGNOSIS — G4700 Insomnia, unspecified: Secondary | ICD-10-CM

## 2021-06-07 DIAGNOSIS — G478 Other sleep disorders: Secondary | ICD-10-CM

## 2021-06-07 NOTE — Telephone Encounter (Signed)
Called patient to discuss sleep study results. No answer at this time. LVM for the patient to call back.  Will also send a mychart message.  

## 2021-06-07 NOTE — Telephone Encounter (Signed)
Pt returned call. I advised pt that Dr. Brett Fairy reviewed their sleep study results and found that pt has severe sleep apnea. Dr. Brett Fairy recommends that pt starts auto CPAP. I reviewed PAP compliance expectations with the pt. Pt is agreeable to starting a CPAP. I advised pt that an order will be sent to a DME, [facility], and [facility] will call the pt within about one week after they file with the pt's insurance. [facility] will show the pt how to use the machine, fit for masks, and troubleshoot the CPAP if needed. A follow up appt was made for insurance purposes with Dr. Brett Fairy on 09/10/2021 at 8:30 am. Pt verbalized understanding to arrive 15 minutes early and bring their CPAP. A letter with all of this information in it will be mailed to the pt as a reminder. I verified with the pt that the address we have on file is correct. Pt verbalized understanding of results. Pt had no questions at this time but was encouraged to call back if questions arise. I have sent the order to [facility] and have received confirmation that they have received the order.

## 2021-06-07 NOTE — Telephone Encounter (Signed)
-----   Message from Larey Seat, MD sent at 06/05/2021  6:04 PM EDT ----- RECOMMENDATION: I have concerns about the severity of apnea and the associated 24% CSR and would prefer an in-lab CPAP titration. This will allow [place] to switch from CPAP to BiPAP (if needed, or to add oxygen).  The patient seems to prefer prone sleep and it is often difficult to sleep in that position with any CPAP interface. I therefor would ask to use a nasal pillow first.  If the patient is unable to find rest in a sleep laboratory, my Plan B would be an autotitration CPAP device to be initiated at 6-18 cm water pressure  with 3 cm EPR, mask of patient's choice and heated humidification for machine and tubing.

## 2021-06-07 NOTE — Addendum Note (Signed)
Addended by: Darleen Crocker on: 06/07/2021 09:52 AM   Modules accepted: Orders

## 2021-07-05 ENCOUNTER — Telehealth: Payer: Self-pay | Admitting: Neurology

## 2021-07-05 NOTE — Telephone Encounter (Signed)
Pt was scheduled for his Initial CPAP visit on 07/16/2021 Pt was informed to bring machine and power cord to the appt, DME: [facility]: [phone] Option 1 F: [phone] Equipment Issued: Matthew Salinas 10 set up on 06/12/2021 Schedule between 07/13/2021-09/10/2021

## 2021-07-11 ENCOUNTER — Encounter: Payer: Self-pay | Admitting: Neurology

## 2021-07-11 ENCOUNTER — Ambulatory Visit: Payer: BC Managed Care – PPO | Admitting: Nurse Practitioner

## 2021-07-16 ENCOUNTER — Encounter: Payer: Self-pay | Admitting: Neurology

## 2021-07-16 ENCOUNTER — Ambulatory Visit: Payer: BC Managed Care – PPO | Admitting: Neurology

## 2021-07-16 VITALS — BP 137/94 | HR 111 | Ht 71.0 in | Wt 222.0 lb

## 2021-07-16 DIAGNOSIS — E782 Mixed hyperlipidemia: Secondary | ICD-10-CM | POA: Diagnosis not present

## 2021-07-16 DIAGNOSIS — G4733 Obstructive sleep apnea (adult) (pediatric): Secondary | ICD-10-CM | POA: Diagnosis not present

## 2021-07-16 NOTE — Patient Instructions (Signed)
RV in 12 months.

## 2021-07-16 NOTE — Progress Notes (Signed)
SLEEP MEDICINE CLINIC    Provider:  Larey Seat, MD  Primary Care Physician:  Matthew Pinto, MD [address] [place] [address]     Referring Provider: Unk Salinas, [place] [address] [place] [address] [address],  [place] [address]          Chief Complaint according to patient   Patient presents with:     New Patient (Initial Visit)           HISTORY OF PRESENT ILLNESS:  Matthew Salinas is a 55 y.o. year old Caucasian male patient seen here as a referral on 07/16/2021 from Dr Matthew Salinas for a new sleep consultation.  !09-16-2020: [name] underwent a home sleep test on 05-28-2021 and reached 33% REM sleep during this test he slept for a total 8 hours and 33 minutes.  The mean snoring volume was 42 dB.  50% of his sleep were accompanied by brother loud snoring.  In supine sleep he reached 54 apneas per hour and overall his AHI was 37.9.  I ordered an auto titration device which the patient has used compliantly for 86% of the time and date with an average use at time of 4 hours 59 minutes.  This machine is set between 6 and 18 cmH2O pressure and allows 3 cm reduction for expiratory pressure.  The residual AHI is 1.7/h which is a significant improvement.  The patient feels more rested when he uses his CPAP the nasal pillow however has been difficult for him as he tends to develop air leaks or venting.  The 95th percentile pressure was only 8.4 cmH2O and I would like for the patient to continue with the current settings to general approach to the medical equipment company about a full facemask.  We have to be aware of his facial hair that may interfere with the air seal.  His current fatigue severity score is 12 points much down from his initial visit his Epworth sleepiness score was endorsed at 4 points also much reduced in comparison to before.    Chief concern according to patient :   I have the pleasure of seeing Matthew Salinas today, a right  -handed Caucasian male with a known sleep disorder.      He has a past medical history of Allergy, GERD (gastroesophageal reflux disease), Hyperlipidemia, Hypertension, IBS (irritable bowel syndrome), Traumatic pneumothorax (12/04/2015), and Vitamin D deficiency..  He has very large tonsils, and he does sometimes have a choking episode when sleeping on his back.  His wife has also noticed snoring and apneic breathing.  He had 2 rotator cuff tears and cannot sleep easily on his side for that reason.   The patient had the first sleep study in the year 2017  with a result of an AHI ( Apnea Hypopnea index)  of 1.5,   Sleep relevant medical history: he can't sleep easily in a lab and wants now a HST-   Family medical /sleep history: No other family member on CPAP with OSA, insomnia, sleep walkers.    Social history:  Patient is working as a Administrator , and lives in a household spouse, children are gone. The patient currently works part time-. Pets are present. Tobacco use- quit 20 years ago. Marland Kitchen  ETOH use rare ,  Caffeine intake in form of Coffee( 1 up a day) Soda( /) Tea ( for dinner ) or energy drinks. Regular exercise in form of .   Hobbies :Hotel manager  stuff.       Sleep habits are as follows:  The patient's dinner time is between 7 PM. The patient goes to bed at 9.30 PM and continues to sleep for intervals of 1-2 hours-  hours, wakes from pain in shoulders. Has 1-2 bathroom breaks, the first time at 2 AM.   The preferred sleep position is supine, with the support of 1-2 pillows. Dreams are reportedly frequent/vivid.  6  AM is the usual rise time. The patient wakes up spontaneously .  He reports not feeling refreshed or restored in AM, with symptoms such as dry mouth, morning headaches, and stiffness, residual fatigue. Naps are taken infrequently, seldomly.   Review of Systems: Out of a complete 14 system review, the patient complains of only the following symptoms, and all other reviewed  systems are negative.:  Fatigue, sleepiness , snoring, fragmented sleep, Insomnia mind racing, pain , choking.    How likely are you to doze in the following situations: 0 = not likely, 1 = slight chance, 2 = moderate chance, 3 = high chance   Sitting and Reading? Watching Television? Sitting inactive in a public place (theater or meeting)? As a passenger in a car for an hour without a break? Lying down in the afternoon when circumstances permit? Sitting and talking to someone? Sitting quietly after lunch without alcohol? In a car, while stopped for a few minutes in traffic?   Total = 4/ 24 points   FSS endorsed at  12 , down from 19/ 63 points.   Social History   Socioeconomic History   Marital status: Divorced    Spouse name: Not on file   Number of children: Not on file   Years of education: Not on file   Highest education level: Not on file  Occupational History   Not on file  Tobacco Use   Smoking status: Former    Types: Cigarettes    Quit date: 08/05/2001    Years since quitting: 19.9   Smokeless tobacco: Never   Tobacco comments:    Smoked 5-10 cigarettes per day since age 57  Substance and Sexual Activity   Alcohol use: Yes    Alcohol/week: 10.0 standard drinks    Types: 10 Standard drinks or equivalent per week   Drug use: No   Sexual activity: Not on file  Other Topics Concern   Not on file  Social History Narrative   Not on file   Social Determinants of Health   Financial Resource Strain: Not on file  Food Insecurity: Not on file  Transportation Needs: Not on file  Physical Activity: Not on file  Stress: Not on file  Social Connections: Not on file    Family History  Problem Relation Age of Onset   Cancer Mother        lung   Hyperlipidemia Mother    Hypertension Father    Hyperlipidemia Father    Dementia Brother    Hyperlipidemia Brother    Hyperlipidemia Brother    Hyperlipidemia Brother    Colon cancer Maternal Grandmother    Diabetes  Maternal Grandfather     Past Medical History:  Diagnosis Date   Allergy    GERD (gastroesophageal reflux disease)    Hyperlipidemia    Hypertension    IBS (irritable bowel syndrome)    Traumatic pneumothorax 12/04/2015   Vitamin D deficiency     Past Surgical History:  Procedure Laterality Date   CHEST TUBE INSERTION Right 05-15-02  thoracostomy   ESOPHAGOGASTRODUODENOSCOPY (EGD) WITH PROPOFOL N/A 12/15/2020   Procedure: ESOPHAGOGASTRODUODENOSCOPY (EGD) WITH PROPOFOL;  Surgeon: Robert Bellow, MD;  Location: [facility] ENDOSCOPY;  Service: Endoscopy;  Laterality: N/A;  with possible dilation   TOE SURGERY  2008   right big toe     Current Outpatient Medications on File Prior to Visit  Medication Sig Dispense Refill   ALPRAZolam (XANAX) 1 MG tablet Take  1/2 to 1 tablet  2 to 3 x / day  ONLY  if needed for Anxiety Attack & please try to limit to 5 days /week to avoid Addiction & Dementia 45 tablet 0   cholecalciferol (VITAMIN D) 1000 UNITS tablet Take 5,000 Units by mouth daily.      famotidine (PEPCID) 20 MG tablet Take 1 tablet (20 mg total) by mouth 2 (two) times daily. 60 tablet 1   lidocaine-prilocaine (EMLA) cream APPLY TOPICALLY AS NEEDED 90 g 11   lisinopril-hydrochlorothiazide (ZESTORETIC) 20-25 MG tablet TAKE 1 TABLET BY MOUTH ONCE DAILY (FOR  BLOOD  PRESSURE  AND  FLUID  RETENTION/ANKLE  SWELLING) (Patient taking differently: 0.5 tablets. TAKE 1 TABLET BY MOUTH ONCE DAILY (FOR  BLOOD  PRESSURE  AND  FLUID  RETENTION/ANKLE  SWELLING)) 90 tablet 1   montelukast (SINGULAIR) 10 MG tablet Take 1 tablet Daily for Allergies & Asthma / Patient knows to take by mouth 90 tablet 3   Multiple Vitamins-Minerals (MULTIVITAMIN PO) Take by mouth.     Omega-3 Fatty Acids (FISH OIL PO) Take by mouth.     omeprazole (PRILOSEC) 40 MG capsule TAKE 1 CAPSULE BY MOUTH DAILY TO PREVENT HEARTBURN AND INDIGESTION 90 capsule 1   PROCTO-MED HC 2.5 % rectal cream APPLY CREAM  RECTALLY 4 TIMES DAILY 56 g  3   rosuvastatin (CRESTOR) 20 MG tablet Take 1 tablet Daily for Cholesterol 90 tablet 3   No current facility-administered medications on file prior to visit.    Allergies  Allergen Reactions   Lipitor [Atorvastatin]     Myalgias   Prozac [Fluoxetine Hcl]    Rosuvastatin     myalgias   Simvastatin     Myalgias    Physical exam:  Today's Vitals   07/16/21 0818  BP: (!) 137/94  Pulse: (!) 111  Weight: 222 lb (100.7 kg)  Height: 5\' 11"  (1.803 m)   Body mass index is 30.96 kg/m.   Wt Readings from Last 3 Encounters:  07/16/21 222 lb (100.7 kg)  05/07/21 220 lb (99.8 kg)  05/01/21 218 lb 9.6 oz (99.2 kg)     Ht Readings from Last 3 Encounters:  07/16/21 5\' 11"  (1.803 m)  05/07/21 5\' 11"  (1.803 m)  01/24/21 (P) 5\' 11"  (1.803 m)      General: The patient is awake, alert and appears not in acute distress. The patient is well groomed. Head: Normocephalic, atraumatic. Neck is supple. Mallampati 2- but with tonsils that kiss the uvula. Small oral opening.    neck circumference:18 inches . Nasal airflow  patent.  Retrognathia is  seen.  Dental status:  Cardiovascular:  Regular rate and cardiac rhythm by pulse,  without distended neck veins. Respiratory: Lungs are clear to auscultation.  Skin:  Without evidence of ankle edema, or rash. Trunk: The patient's posture is erect.   Neurologic exam : The patient is awake and alert, oriented to place and time.   Memory subjective described as intact.  Attention span & concentration ability appears normal.  Speech is fluent,  without  dysarthria, dysphonia or aphasia.  Mood and affect are appropriate.   Cranial nerves: no loss of smell or taste reported  Pupils are equal and briskly reactive to light.  Facial sensation intact to fine touch. Facial motor strength is symmetric and tongue and uvula move midline.  Neck ROM : rotation, tilt and flexion extension were normal for age and shoulder shrug was symmetrical.    Motor exam:   Symmetric bulk, tone and ROM.   Normal tone without cog wheeling, symmetric grip strength .   Sensory:  Fine touch and vibration were  normal.  Proprioception tested in the upper extremities was normal.   Coordination: Rapid alternating movements in the fingers/hands were of normal speed.  The Finger-to-nose maneuver was intact without evidence of ataxia, dysmetria or tremor.   Gait and station: Patient could rise unassisted from a seated position, walked without assistive device.  Stance is of normal width/ base a.  Toe and heel walk were deferred.  Deep tendon reflexes: in the  upper and lower extremities are symmetric and intact.  Babinski response was deferred.     After spending a total time of 20 minutes face to face and additional time for physical and neurologic examination, review of laboratory studies,  personal review of imaging studies, reports and results of other testing and review of referral information / records as far as provided in visit, I have established the following assessments:    My Plan is to proceed with:  1) Consider FFM for his current CPAP and settings. Patient sleeps much calmer and in supine, no snoring.  2) Consider ENT referral , depending on outcome.    I would like to thank Matthew Pinto, MD and Matthew Salinas, [place] [address] [place],  [place] [address] for allowing me to meet with and to take care of this pleasant patient.   I plan to follow up either personally or through our NP within 12 month.   CC: I will share my notes with PCP .  Electronically signed by: Matthew Seat, MD 07/16/2021 8:53 AM  [facility] Board certified by The [employer] of Sleep Medicine and Diplomate of the [employer] of Sleep Medicine. Board certified In Neurology through the [place], Fellow of the [employer] of Neurology. Medical Director of [facility].

## 2021-07-24 ENCOUNTER — Other Ambulatory Visit: Payer: Self-pay | Admitting: Internal Medicine

## 2021-07-24 DIAGNOSIS — F419 Anxiety disorder, unspecified: Secondary | ICD-10-CM

## 2021-07-24 MED ORDER — ALPRAZOLAM 1 MG PO TABS: ORAL_TABLET | ORAL | 0 refills | Status: DC

## 2021-08-13 NOTE — Progress Notes (Signed)
FOLLOW UP 3 MONTH  Assessment and Plan:   Hypertension Well controlled with current medications - 1/2 tab lisinopril/hctz 20/25mg  QD Monitor blood pressure at home; patient to call if consistently greater than 130/80 Continue DASH diet.   Reminder to go to the ER if any CP, SOB, nausea, dizziness, severe HA, changes vision/speech, left arm numbness and tingling and jaw pain. Check CBC  Cholesterol Currently taking Rosuvastatin 20 mg QD Continue low cholesterol diet and exercise.  Check lipid panel  Other abnormal glucose Recent A1Cs well controlled , wants A1c checked as he is concerned cholesterol medication(Zetia) previously raised his blood sugar- currently on Rosuvastatin Continue diet and exercise.  Perform daily foot/skin check, notify office of any concerning changes.  Check A1C; check CMP  Obesity Long discussion about weight loss, diet, and exercise Recommended diet heavy in fruits and veggies and low in animal meats, cheeses, and dairy products, appropriate calorie intake Discussed ideal weight for height and  weight goal (200 lb) Will follow up in 3 months Check thyroid  Vitamin D Def Near goal at last visit; continue supplementation to maintain goal of 60-100 Defer Vit D level  Anxiety/Insomnia Well managed by current regimen; continue medications Using Alprazolam 1mg , half tablet at night for sleep. Stress management techniques discussed, increase water, good sleep hygiene discussed, increase exercise, and increase veggies.   Medication Management  Continued  NonSeasonal alleregic Rhinitis Continue Singulair daily Start Zyrtec can take with singulair.    Continue diet and meds as discussed. Further disposition pending results of labs. Discussed med's effects and SE's.   Over 30 minutes of face to face interview, exam, counseling, chart review, and critical decision making was performed.   Future Appointments  Date Time Provider [place]   01/24/2022 11:00 AM Unk Pinto, MD [facility] None  07/17/2022  8:30 AM Dohmeier, Asencion Partridge, MD [facility] None    ----------------------------------------------------------------------------------------------------------------------  HPI 56 y.o. male  presents for 3 month follow up on hypertension, cholesterol, glucose management, obesity and vitamin D deficiency.  Patient was evaluated  by Dr Brett Fairy and sleep study showed only snoring w/o apnea and he declined f/u or referral for an oral appliance.  He is having some congestion in his left ear, using Singulair and Zyrtec and it has improved.   Insomina he is using alprazolam 1/2 tablet which is helping.  He has some lower back pain, has seen [facility], has MRI with "arthritis"; has been taking tylenol and meloxicam, has had some hip spasms.  BMI is Body mass index is 31.58 kg/m., he has been working on diet and exercise, he is very active in his yard/garden, also physical job. Eating lots of veggies.  Wt Readings from Last 3 Encounters:  08/14/21 226 lb 6.4 oz (102.7 kg)  07/16/21 222 lb (100.7 kg)  05/07/21 220 lb (99.8 kg)   His blood pressure has been controlled at home (115-130s/70-80s), today their BP is BP: 128/74  He does workout. He denies chest pain, shortness of breath, dizziness.   He is on cholesterol medication Rosuvastatin 20 mg(was on zetia 10 mg daily and livalo 2 mg daily but reports had severe fatigue, also some leg pain and stopped taking). Hx of statin myalgias with other statins.   His cholesterol is not at goal, remains severely elevated. He had cardio IQ panel in 05/2017 which showed LDL particle 2305, LDL small 529, apolipoprotein B of 146. Family history of hyperlipidemia but no MI/CVA. The cholesterol last visit was:   Lab Results  Component Value Date   CHOL 238 (H) 05/01/2021   HDL 71 05/01/2021   LDLCALC 125 (H) 05/01/2021   TRIG 272 (H) 05/01/2021   CHOLHDL 3.4 05/01/2021    He has been  working on diet and exercise for glucose management, and denies foot ulcerations, increased appetite, nausea, paresthesia of the feet, polydipsia, polyuria, visual disturbances, vomiting and weight loss. Last A1C in the office was:  Lab Results  Component Value Date   HGBA1C 5.1 05/01/2021    Patient is on Vitamin D supplement.   Lab Results  Component Value Date   VD25OH 62 05/01/2021       Current Medications:  Current Outpatient Medications on File Prior to Visit  Medication Sig   ALPRAZolam (XANAX) 1 MG tablet Take  1/2 to 1 tablet  2 to 3 x / day  ONLY  if needed for Anxiety Attack & please try to limit to 5 days /week to avoid Addiction & Dementia   cholecalciferol (VITAMIN D) 1000 UNITS tablet Take 5,000 Units by mouth daily.    famotidine (PEPCID) 20 MG tablet Take 1 tablet (20 mg total) by mouth 2 (two) times daily.   lidocaine-prilocaine (EMLA) cream APPLY TOPICALLY AS NEEDED   lisinopril-hydrochlorothiazide (ZESTORETIC) 20-25 MG tablet TAKE 1 TABLET BY MOUTH ONCE DAILY (FOR  BLOOD  PRESSURE  AND  FLUID  RETENTION/ANKLE  SWELLING) (Patient taking differently: 0.5 tablets. TAKE 1 TABLET BY MOUTH ONCE DAILY (FOR  BLOOD  PRESSURE  AND  FLUID  RETENTION/ANKLE  SWELLING))   montelukast (SINGULAIR) 10 MG tablet Take 1 tablet Daily for Allergies & Asthma / Patient knows to take by mouth   Multiple Vitamins-Minerals (MULTIVITAMIN PO) Take by mouth.   Omega-3 Fatty Acids (FISH OIL PO) Take by mouth.   omeprazole (PRILOSEC) 40 MG capsule TAKE 1 CAPSULE BY MOUTH DAILY TO PREVENT HEARTBURN AND INDIGESTION   PROCTO-MED HC 2.5 % rectal cream APPLY CREAM  RECTALLY 4 TIMES DAILY   rosuvastatin (CRESTOR) 20 MG tablet Take 1 tablet Daily for Cholesterol   No current facility-administered medications on file prior to visit.     Allergies:  Allergies  Allergen Reactions   Lipitor [Atorvastatin]     Myalgias   Prozac [Fluoxetine Hcl]    Rosuvastatin     myalgias   Simvastatin      Myalgias     Medical History:  Past Medical History:  Diagnosis Date   Allergy    GERD (gastroesophageal reflux disease)    Hyperlipidemia    Hypertension    IBS (irritable bowel syndrome)    Traumatic pneumothorax 12/04/2015   Vitamin D deficiency    Family history- Reviewed and unchanged Social history- Reviewed and unchanged   Review of Systems:  Review of Systems  Constitutional: Negative for malaise/fatigue and weight loss.  HENT: Negative for hearing loss and tinnitus. Ears feel full L>R  Eyes: Negative for blurred vision and double vision.  Respiratory: Negative for cough, shortness of breath and wheezing.   Cardiovascular: Negative for chest pain, palpitations, orthopnea, claudication and leg swelling.  Gastrointestinal: Negative for abdominal pain, blood in stool, constipation, diarrhea, heartburn, melena, nausea and vomiting.  Genitourinary: Negative.   Musculoskeletal: Negative for joint pain and myalgias.  Skin: Negative for rash.  Neurological: Negative for dizziness, tingling, sensory change, weakness and headaches.  Endo/Heme/Allergies: Negative for polydipsia.  Psychiatric/Behavioral: Negative.   All other systems reviewed and are negative.    Physical Exam: BP 128/74    Pulse 79  Temp 97.9 F (36.6 C)    Resp 16    Ht 5\' 11"  (1.803 m)    Wt 226 lb 6.4 oz (102.7 kg)    SpO2 98%    BMI 31.58 kg/m  Wt Readings from Last 3 Encounters:  08/14/21 226 lb 6.4 oz (102.7 kg)  07/16/21 222 lb (100.7 kg)  05/07/21 220 lb (99.8 kg)   General Appearance: Well nourished, in no apparent distress. Eyes: PERRLA, EOMs, conjunctiva no swelling or erythema Sinuses: No Frontal/maxillary tenderness ENT/Mouth: Ext aud canals clear, TMs without erythema, bulging. No erythema, swelling, or exudate on post pharynx.  Tonsils not swollen or erythematous. Hearing normal.  Neck: Supple, thyroid normal.  Respiratory: Respiratory effort normal, BS equal bilaterally without rales,  rhonchi, wheezing or stridor.  Cardio: RRR with no MRGs. Brisk peripheral pulses without edema.  Abdomen: Soft, + BS.  Non tender, no guarding, rebound, hernias, masses. Lymphatics: Non tender without lymphadenopathy.  Musculoskeletal: Full ROM, 5/5 strength, Normal gait. R hip greater trochanter tenderness, IT band tenderness Skin: Warm, dry without rashes, lesions, ecchymosis.  Neuro: Cranial nerves intact. No cerebellar symptoms.  Psych: Awake and oriented X 3, normal affect, Insight and Judgment appropriate.    Magda Bernheim ANP-C  [facility]  08/14/2021

## 2021-08-14 ENCOUNTER — Other Ambulatory Visit: Payer: Self-pay

## 2021-08-14 ENCOUNTER — Ambulatory Visit (INDEPENDENT_AMBULATORY_CARE_PROVIDER_SITE_OTHER): Payer: BC Managed Care – PPO | Admitting: Nurse Practitioner

## 2021-08-14 ENCOUNTER — Encounter: Payer: Self-pay | Admitting: Nurse Practitioner

## 2021-08-14 VITALS — BP 128/74 | HR 79 | Temp 97.9°F | Resp 16 | Ht 71.0 in | Wt 226.4 lb

## 2021-08-14 DIAGNOSIS — I10 Essential (primary) hypertension: Secondary | ICD-10-CM

## 2021-08-14 DIAGNOSIS — E66811 Obesity, class 1: Secondary | ICD-10-CM

## 2021-08-14 DIAGNOSIS — R7309 Other abnormal glucose: Secondary | ICD-10-CM

## 2021-08-14 DIAGNOSIS — E669 Obesity, unspecified: Secondary | ICD-10-CM

## 2021-08-14 DIAGNOSIS — E559 Vitamin D deficiency, unspecified: Secondary | ICD-10-CM | POA: Diagnosis not present

## 2021-08-14 DIAGNOSIS — E663 Overweight: Secondary | ICD-10-CM

## 2021-08-14 DIAGNOSIS — J3089 Other allergic rhinitis: Secondary | ICD-10-CM

## 2021-08-14 DIAGNOSIS — E782 Mixed hyperlipidemia: Secondary | ICD-10-CM

## 2021-08-14 DIAGNOSIS — F419 Anxiety disorder, unspecified: Secondary | ICD-10-CM

## 2021-08-14 DIAGNOSIS — Z79899 Other long term (current) drug therapy: Secondary | ICD-10-CM

## 2021-08-14 DIAGNOSIS — G4700 Insomnia, unspecified: Secondary | ICD-10-CM

## 2021-08-15 ENCOUNTER — Other Ambulatory Visit: Payer: Self-pay | Admitting: Nurse Practitioner

## 2021-08-15 LAB — CBC WITH DIFFERENTIAL/PLATELET
Absolute Monocytes: 427 cells/uL (ref 200–950)
Basophils Absolute: 61 cells/uL (ref 0–200)
Basophils Relative: 1 %
Eosinophils Absolute: 98 cells/uL (ref 15–500)
Eosinophils Relative: 1.6 %
HCT: 46.6 % (ref 38.5–50.0)
Hemoglobin: 15.2 g/dL (ref 13.2–17.1)
Lymphs Abs: 2031 cells/uL (ref 850–3900)
MCH: 30.8 pg (ref 27.0–33.0)
MCHC: 32.6 g/dL (ref 32.0–36.0)
MCV: 94.5 fL (ref 80.0–100.0)
MPV: 10.8 fL (ref 7.5–12.5)
Monocytes Relative: 7 %
Neutro Abs: 3483 cells/uL (ref 1500–7800)
Neutrophils Relative %: 57.1 %
Platelets: 295 10*3/uL (ref 140–400)
RBC: 4.93 10*6/uL (ref 4.20–5.80)
RDW: 11.9 % (ref 11.0–15.0)
Total Lymphocyte: 33.3 %
WBC: 6.1 10*3/uL (ref 3.8–10.8)

## 2021-08-15 LAB — LIPID PANEL
Cholesterol: 211 mg/dL — ABNORMAL HIGH (ref <200)
HDL: 54 mg/dL (ref >=40)
Non-HDL Cholesterol (Calc): 157 mg/dL (calc) — ABNORMAL HIGH (ref <130)
Total CHOL/HDL Ratio: 3.9 (calc) (ref <5.0)
Triglycerides: 548 mg/dL — ABNORMAL HIGH (ref <150)

## 2021-08-15 LAB — COMPLETE METABOLIC PANEL WITH GFR
AG Ratio: 1.8 (calc) (ref 1.0–2.5)
ALT: 28 U/L (ref 9–46)
AST: 38 U/L — ABNORMAL HIGH (ref 10–35)
Albumin: 4.6 g/dL (ref 3.6–5.1)
Alkaline phosphatase (APISO): 66 U/L (ref 35–144)
BUN: 14 mg/dL (ref 7–25)
CO2: 26 mmol/L (ref 20–32)
Calcium: 9.6 mg/dL (ref 8.6–10.3)
Chloride: 100 mmol/L (ref 98–110)
Creat: 0.88 mg/dL (ref 0.70–1.30)
Globulin: 2.6 g/dL (calc) (ref 1.9–3.7)
Glucose, Bld: 99 mg/dL (ref 65–99)
Potassium: 4.9 mmol/L (ref 3.5–5.3)
Sodium: 136 mmol/L (ref 135–146)
Total Bilirubin: 1.1 mg/dL (ref 0.2–1.2)
Total Protein: 7.2 g/dL (ref 6.1–8.1)
eGFR: 100 mL/min/{1.73_m2} (ref >=60)

## 2021-08-15 LAB — HEMOGLOBIN A1C
Hgb A1c MFr Bld: 5.4 % of total Hgb (ref <5.7)
Mean Plasma Glucose: 108 mg/dL
eAG (mmol/L): 6 mmol/L

## 2021-08-15 LAB — TSH: TSH: 1.9 mIU/L (ref 0.40–4.50)

## 2021-08-24 ENCOUNTER — Other Ambulatory Visit: Payer: Self-pay | Admitting: Adult Health

## 2021-09-10 ENCOUNTER — Ambulatory Visit: Payer: BC Managed Care – PPO | Admitting: Neurology

## 2021-09-11 ENCOUNTER — Other Ambulatory Visit: Payer: Self-pay | Admitting: Internal Medicine

## 2021-11-07 NOTE — Progress Notes (Deleted)
?FOLLOW UP 3 MONTH ? ?Assessment and Plan:  ? ?Hypertension ?Well controlled with current medications - 1/2 tab lisinopril/hctz 20/'25mg'$  QD ?Monitor blood pressure at home; patient to call if consistently greater than 130/80 ?Continue DASH diet.   ?Reminder to go to the ER if any CP, SOB, nausea, dizziness, severe HA, changes vision/speech, left arm numbness and tingling and jaw pain. ?Check CBC ? ?Cholesterol ?Currently taking Rosuvastatin 20 mg QD ?Continue low cholesterol diet and exercise.  ?Check lipid panel ? ?Other abnormal glucose ?Recent A1Cs well controlled , wants A1c checked as he is concerned cholesterol medication(Zetia) previously raised his blood sugar- currently on Rosuvastatin ?Continue diet and exercise.  ?Perform daily foot/skin check, notify office of any concerning changes.  ?Check A1C; check CMP ? ?Obesity ?Long discussion about weight loss, diet, and exercise ?Recommended diet heavy in fruits and veggies and low in animal meats, cheeses, and dairy products, appropriate calorie intake ?Discussed ideal weight for height and  weight goal (200 lb) ?Will follow up in 3 months ?Check thyroid ? ?Vitamin D Def ?Near goal at last visit; continue supplementation to maintain goal of 60-100 ?Defer Vit D level ? ?Anxiety/Insomnia ?Well managed by current regimen; continue medications ?Using Alprazolam '1mg'$ , half tablet at night for sleep. ?Stress management techniques discussed, increase water, good sleep hygiene discussed, increase exercise, and increase veggies.  ? ?Medication Management  ?Continued ? ?NonSeasonal alleregic Rhinitis ?Continue Singulair daily ?Start Zyrtec can take with singulair. ? ? ? ?Continue diet and meds as discussed. Further disposition pending results of labs. Discussed med's effects and SE's.   ?Over 30 minutes of face to face interview, exam, counseling, chart review, and critical decision making was performed.  ? ?Future Appointments  ?Date Time Provider [place]   ?11/12/2021 11:00 AM Magda Bernheim, NP [facility] None  ?01/24/2022 11:00 AM Unk Pinto, MD [facility] None  ?07/17/2022  8:30 AM Dohmeier, Asencion Partridge, MD [facility] None  ? ? ?---------------------------------------------------------------------------------------------------------------------- ? ?HPI ?56 y.o. male  presents for 3 month follow up on hypertension, cholesterol, glucose management, obesity and vitamin D deficiency.  Patient was evaluated  by Dr Brett Fairy and sleep study showed only snoring w/o apnea and he declined f/u or referral for an oral appliance. ? ?He is having some congestion in his left ear, using Singulair and Zyrtec and it has improved.  ? ?Insomina he is using alprazolam 1/2 tablet which is helping. ? ?He has some lower back pain, has seen [facility], has MRI with "arthritis"; has been taking tylenol and meloxicam, has had some hip spasms. ? ?BMI is There is no height or weight on file to calculate BMI., he has been working on diet and exercise, he is very active in his yard/garden, also physical job. Eating lots of veggies.  ?Wt Readings from Last 3 Encounters:  ?08/14/21 226 lb 6.4 oz (102.7 kg)  ?07/16/21 222 lb (100.7 kg)  ?05/07/21 220 lb (99.8 kg)  ? ?His blood pressure has been controlled at home (115-130s/70-80s), today their BP is   ? He does workout. He denies chest pain, shortness of breath, dizziness. ? ? He is on cholesterol medication Rosuvastatin 20 mg(was on zetia 10 mg daily and livalo 2 mg daily but reports had severe fatigue, also some leg pain and stopped taking). Hx of statin myalgias with other statins.   His cholesterol is not at goal, remains severely elevated. He had cardio IQ panel in 05/2017 which showed LDL particle 2305, LDL small 529, apolipoprotein B of 146. Family  history of hyperlipidemia but no MI/CVA. The cholesterol last visit was:   ?Lab Results  ?Component Value Date  ? CHOL 211 (H) 08/14/2021  ? HDL 54 08/14/2021  ? [place]  08/14/2021  ?   Comment:   ?   . ?LDL cholesterol not calculated. Triglyceride levels ?greater than 400 mg/dL invalidate calculated LDL results. ?. ?Reference range: <100 ?Marland Kitchen ?Desirable range <100 mg/dL for primary prevention;   ?<70 mg/dL for patients with CHD or diabetic patients  ?with > or = 2 CHD risk factors. ?. ?LDL-C is now calculated using the Martin-Hopkins  ?calculation, which is a validated novel method providing  ?better accuracy than the Friedewald equation in the  ?estimation of LDL-C.  ?Cresenciano Genre et al. Annamaria Helling. 7989;211(94): 2061-2068  ?(http://education.[facility].com/faq/FAQ164) ?  ? TRIG 548 (H) 08/14/2021  ? CHOLHDL 3.9 08/14/2021  ? ? He has been working on diet and exercise for glucose management, and denies foot ulcerations, increased appetite, nausea, paresthesia of the feet, polydipsia, polyuria, visual disturbances, vomiting and weight loss. Last A1C in the office was:  ?Lab Results  ?Component Value Date  ? HGBA1C 5.4 08/14/2021  ?  ?Patient is on Vitamin D supplement.   ?Lab Results  ?Component Value Date  ? VD25OH 62 05/01/2021  ?   ? ? ?Current Medications:  ?Current Outpatient Medications on File Prior to Visit  ?Medication Sig  ? ALPRAZolam (XANAX) 1 MG tablet Take  1/2 to 1 tablet  2 to 3 x / day  ONLY  if needed for Anxiety Attack & please try to limit to 5 days /week to avoid Addiction & Dementia  ? cholecalciferol (VITAMIN D) 1000 UNITS tablet Take 5,000 Units by mouth daily.   ? famotidine (PEPCID) 20 MG tablet Take 1 tablet by mouth twice daily  ? lidocaine-prilocaine (EMLA) cream APPLY TOPICALLY AS NEEDED  ? lisinopril-hydrochlorothiazide (ZESTORETIC) 20-25 MG tablet TAKE 1 TABLET BY MOUTH ONCE DAILY (FOR  BLOOD  PRESSURE  AND  FLUID  RETENTION/ANKLE  SWELLING) (Patient taking differently: 0.5 tablets. TAKE 1 TABLET BY MOUTH ONCE DAILY (FOR  BLOOD  PRESSURE  AND  FLUID  RETENTION/ANKLE  SWELLING))  ? montelukast (SINGULAIR) 10 MG tablet Take 1 tablet Daily for Allergies & Asthma / Patient knows to  take by mouth  ? Multiple Vitamins-Minerals (MULTIVITAMIN PO) Take by mouth.  ? Omega-3 Fatty Acids (FISH OIL PO) Take by mouth.  ? omeprazole (PRILOSEC) 40 MG capsule TAKE 1 CAPSULE BY MOUTH DAILY TO PREVENT HEARTBURN AND INDIGESTION  ? PROCTO-MED HC 2.5 % rectal cream APPLY CREAM  RECTALLY 4 TIMES DAILY  ? rosuvastatin (CRESTOR) 20 MG tablet Take 1 tablet Daily for Cholesterol  ? ?No current facility-administered medications on file prior to visit.  ? ? ? ?Allergies:  ?Allergies  ?Allergen Reactions  ? Lipitor [Atorvastatin]   ?  Myalgias  ? Prozac [Fluoxetine Hcl]   ? Rosuvastatin   ?  myalgias  ? Simvastatin   ?  Myalgias  ?  ? ?Medical History:  ?Past Medical History:  ?Diagnosis Date  ? Allergy   ? GERD (gastroesophageal reflux disease)   ? Hyperlipidemia   ? Hypertension   ? IBS (irritable bowel syndrome)   ? Traumatic pneumothorax 12/04/2015  ? Vitamin D deficiency   ? ?Family history- Reviewed and unchanged ?Social history- Reviewed and unchanged ? ? ?Review of Systems:  ?Review of Systems  ?Constitutional: Negative for malaise/fatigue and weight loss.  ?HENT: Negative for hearing loss and tinnitus. Ears feel full  L>R  ?Eyes: Negative for blurred vision and double vision.  ?Respiratory: Negative for cough, shortness of breath and wheezing.   ?Cardiovascular: Negative for chest pain, palpitations, orthopnea, claudication and leg swelling.  ?Gastrointestinal: Negative for abdominal pain, blood in stool, constipation, diarrhea, heartburn, melena, nausea and vomiting.  ?Genitourinary: Negative.   ?Musculoskeletal: Negative for joint pain and myalgias.  ?Skin: Negative for rash.  ?Neurological: Negative for dizziness, tingling, sensory change, weakness and headaches.  ?Endo/Heme/Allergies: Negative for polydipsia.  ?Psychiatric/Behavioral: Negative.   ?All other systems reviewed and are negative. ? ? ? ?Physical Exam: ?There were no vitals taken for this visit. ?Wt Readings from Last 3 Encounters:  ?08/14/21 226  lb 6.4 oz (102.7 kg)  ?07/16/21 222 lb (100.7 kg)  ?05/07/21 220 lb (99.8 kg)  ? ?General Appearance: Well nourished, in no apparent distress. ?Eyes: PERRLA, EOMs, conjunctiva no swelling or erythema ?Sinuses:

## 2021-11-12 ENCOUNTER — Ambulatory Visit: Payer: BC Managed Care – PPO | Admitting: Nurse Practitioner

## 2021-11-19 NOTE — Progress Notes (Signed)
?FOLLOW UP 3 MONTH ? ?Assessment and Plan:  ? ?Hypertension ?Well controlled with current medications - 1/2 tab lisinopril/hctz 20/'25mg'$  QD ?Monitor blood pressure at home; patient to call if consistently greater than 130/80 ?Continue DASH diet.   ?Reminder to go to the ER if any CP, SOB, nausea, dizziness, severe HA, changes vision/speech, left arm numbness and tingling and jaw pain. ?Check CBC ? ?Cholesterol ?Currently taking Rosuvastatin 20 mg QD, has had some leg cramps but uncertain if related to meds- will monitor ?Continue low cholesterol diet and exercise.  ?Check lipid panel ? ?Other abnormal glucose ?Recent A1Cs well controlled , wants A1c checked as he is concerned cholesterol medication(Zetia) previously raised his blood sugar- currently on Rosuvastatin ?Continue diet and exercise.  ?Perform daily foot/skin check, notify office of any concerning changes.  ?Check A1C; check CMP ? ?Obesity ?Long discussion about weight loss, diet, and exercise ?Recommended diet heavy in fruits and veggies and low in animal meats, cheeses, and dairy products, appropriate calorie intake ?Discussed ideal weight for height and  weight goal (200 lb) ?Will follow up in 3 months ?Check thyroid ? ?Vitamin D Def ?Near goal at last visit; continue supplementation to maintain goal of 60-100 ?Defer Vit D level ? ?Anxiety/Insomnia ?Well managed by current regimen; continue medications ?Using Alprazolam '1mg'$ , half tablet at night for sleep. ?Stress management techniques discussed, increase water, good sleep hygiene discussed, increase exercise, and increase veggies.  ? ?Medication Management  ?Continued ? ?NonSeasonal alleregic Rhinitis ?Continue Singulair daily ?Start Zyrtec can take with singulair. ? ?Low back Pain with sciatica ?Monitor if symptoms worsen will refer back to orthopedics ? ?Ear lesion ?Where previous biopsy of left auricle he will pick and doesn't heal ?Mupirocin ointment nightly, avoiding picking ?If still does not  heal would like reevaluated at derm ? ? ? ? ? ?Continue diet and meds as discussed. Further disposition pending results of labs. Discussed med's effects and SE's.   ?Over 30 minutes of face to face interview, exam, counseling, chart review, and critical decision making was performed.  ? ?Future Appointments  ?Date Time Provider [place]  ?01/24/2022 11:00 AM Unk Pinto, MD [facility] None  ?07/17/2022  8:30 AM Dohmeier, Asencion Partridge, MD [facility] None  ? ? ?---------------------------------------------------------------------------------------------------------------------- ? ?HPI ?56 y.o. male  presents for 3 month follow up on hypertension, cholesterol, glucose management, obesity and vitamin D deficiency.  Patient was evaluated  by Dr Brett Fairy and sleep study showed only snoring w/o apnea and he declined f/u or referral for an oral appliance. ? ?He is noticing some lower leg cramping, uncertain if related to rosuvastatin. ? ?Insomina he is using alprazolam 1/2 tablet which is helping. ? ?He has some lower back pain, has seen [facility], has MRI with "arthritis"; has been taking tylenol and meloxicam, has had some hip spasms. He has been having some pain that will go down his right leg that is sharp shooting.  ? ?BMI is Body mass index is 31.02 kg/m?., he has been working on diet and exercise, he is very active in his yard/garden, also physical job. Eating lots of veggies.  ?Wt Readings from Last 3 Encounters:  ?11/21/21 222 lb 6.4 oz (100.9 kg)  ?08/14/21 226 lb 6.4 oz (102.7 kg)  ?07/16/21 222 lb (100.7 kg)  ? ?His blood pressure has been controlled at home (115-130s/70-80s), today their BP is BP: 114/82 ? He does workout. He denies chest pain, shortness of breath, dizziness. ? ? He is on cholesterol medication Rosuvastatin 20 mg(was on  zetia 10 mg daily and livalo 2 mg daily but reports had severe fatigue, also some leg pain and stopped taking). Hx of statin myalgias with other statins.   His  cholesterol is not at goal, remains severely elevated. He had cardio IQ panel in 05/2017 which showed LDL particle 2305, LDL small 529, apolipoprotein B of 146. Family history of hyperlipidemia but no MI/CVA. The cholesterol last visit was:   ?Lab Results  ?Component Value Date  ? CHOL 211 (H) 08/14/2021  ? HDL 54 08/14/2021  ? [place]  08/14/2021  ?   Comment:  ?   . ?LDL cholesterol not calculated. Triglyceride levels ?greater than 400 mg/dL invalidate calculated LDL results. ?. ?Reference range: <100 ?Marland Kitchen ?Desirable range <100 mg/dL for primary prevention;   ?<70 mg/dL for patients with CHD or diabetic patients  ?with > or = 2 CHD risk factors. ?. ?LDL-C is now calculated using the Martin-Hopkins  ?calculation, which is a validated novel method providing  ?better accuracy than the Friedewald equation in the  ?estimation of LDL-C.  ?Cresenciano Genre et al. Annamaria Helling. 9528;413(24): 2061-2068  ?(http://education.[facility].com/faq/FAQ164) ?  ? TRIG 548 (H) 08/14/2021  ? CHOLHDL 3.9 08/14/2021  ? ? He has been working on diet and exercise for glucose management, and denies foot ulcerations, increased appetite, nausea, paresthesia of the feet, polydipsia, polyuria, visual disturbances, vomiting and weight loss. Last A1C in the office was:  ?Lab Results  ?Component Value Date  ? HGBA1C 5.4 08/14/2021  ?  ?Patient is on Vitamin D supplement.   ?Lab Results  ?Component Value Date  ? VD25OH 62 05/01/2021  ?   ? ? ?Current Medications:  ?Current Outpatient Medications on File Prior to Visit  ?Medication Sig  ? ALPRAZolam (XANAX) 1 MG tablet Take  1/2 to 1 tablet  2 to 3 x / day  ONLY  if needed for Anxiety Attack & please try to limit to 5 days /week to avoid Addiction & Dementia  ? cholecalciferol (VITAMIN D) 1000 UNITS tablet Take 5,000 Units by mouth daily.   ? co-enzyme Q-10 30 MG capsule Take 30 mg by mouth 3 (three) times daily.  ? famotidine (PEPCID) 20 MG tablet Take 1 tablet by mouth twice daily  ? lidocaine-prilocaine  (EMLA) cream APPLY TOPICALLY AS NEEDED  ? lisinopril-hydrochlorothiazide (ZESTORETIC) 20-25 MG tablet TAKE 1 TABLET BY MOUTH ONCE DAILY (FOR  BLOOD  PRESSURE  AND  FLUID  RETENTION/ANKLE  SWELLING) (Patient taking differently: 0.5 tablets. TAKE 1 TABLET BY MOUTH ONCE DAILY (FOR  BLOOD  PRESSURE  AND  FLUID  RETENTION/ANKLE  SWELLING))  ? montelukast (SINGULAIR) 10 MG tablet Take 1 tablet Daily for Allergies & Asthma / Patient knows to take by mouth  ? Multiple Vitamins-Minerals (MULTIVITAMIN PO) Take by mouth.  ? Omega-3 Fatty Acids (FISH OIL PO) Take by mouth.  ? omeprazole (PRILOSEC) 40 MG capsule TAKE 1 CAPSULE BY MOUTH DAILY TO PREVENT HEARTBURN AND INDIGESTION  ? PROCTO-MED HC 2.5 % rectal cream APPLY CREAM  RECTALLY 4 TIMES DAILY  ? rosuvastatin (CRESTOR) 20 MG tablet Take 1 tablet Daily for Cholesterol  ? ?No current facility-administered medications on file prior to visit.  ? ? ? ?Allergies:  ?Allergies  ?Allergen Reactions  ? Lipitor [Atorvastatin]   ?  Myalgias  ? Prozac [Fluoxetine Hcl]   ? Rosuvastatin   ?  myalgias  ? Simvastatin   ?  Myalgias  ?  ? ?Medical History:  ?Past Medical History:  ?Diagnosis Date  ?  Allergy   ? GERD (gastroesophageal reflux disease)   ? Hyperlipidemia   ? Hypertension   ? IBS (irritable bowel syndrome)   ? Traumatic pneumothorax 12/04/2015  ? Vitamin D deficiency   ? ?Family history- Reviewed and unchanged ?Social history- Reviewed and unchanged ? ? ?Review of Systems  ?Constitutional:  Negative for chills, fever and weight loss.  ?HENT:  Negative for congestion and hearing loss.   ?Eyes:  Negative for blurred vision and double vision.  ?Respiratory:  Negative for cough and shortness of breath.   ?Cardiovascular:  Negative for chest pain, palpitations, orthopnea and leg swelling.  ?Gastrointestinal:  Negative for abdominal pain, constipation, diarrhea, heartburn, nausea and vomiting.  ?Musculoskeletal:  Positive for back pain (low back , occ shooting down right leg) and  myalgias (occ muscle cramp lower legs). Negative for falls and joint pain.  ?Skin:  Negative for rash.  ?Neurological:  Negative for dizziness, tingling, tremors, loss of consciousness and headaches.  ?Psychiatric/Behavi

## 2021-11-21 ENCOUNTER — Encounter: Payer: Self-pay | Admitting: Nurse Practitioner

## 2021-11-21 ENCOUNTER — Ambulatory Visit: Payer: BC Managed Care – PPO | Admitting: Nurse Practitioner

## 2021-11-21 VITALS — BP 114/82 | HR 79 | Temp 97.7°F | Wt 222.4 lb

## 2021-11-21 DIAGNOSIS — E782 Mixed hyperlipidemia: Secondary | ICD-10-CM | POA: Diagnosis not present

## 2021-11-21 DIAGNOSIS — I10 Essential (primary) hypertension: Secondary | ICD-10-CM

## 2021-11-21 DIAGNOSIS — R7309 Other abnormal glucose: Secondary | ICD-10-CM | POA: Diagnosis not present

## 2021-11-21 DIAGNOSIS — E669 Obesity, unspecified: Secondary | ICD-10-CM

## 2021-11-21 DIAGNOSIS — H9390 Unspecified disorder of ear, unspecified ear: Secondary | ICD-10-CM

## 2021-11-21 DIAGNOSIS — F419 Anxiety disorder, unspecified: Secondary | ICD-10-CM

## 2021-11-21 DIAGNOSIS — E559 Vitamin D deficiency, unspecified: Secondary | ICD-10-CM

## 2021-11-21 DIAGNOSIS — G4700 Insomnia, unspecified: Secondary | ICD-10-CM

## 2021-11-21 DIAGNOSIS — Z79899 Other long term (current) drug therapy: Secondary | ICD-10-CM

## 2021-11-21 DIAGNOSIS — M5440 Lumbago with sciatica, unspecified side: Secondary | ICD-10-CM

## 2021-11-21 DIAGNOSIS — J3089 Other allergic rhinitis: Secondary | ICD-10-CM

## 2021-11-21 MED ORDER — MUPIROCIN 2 % EX OINT: 1.0000 "application " | TOPICAL_OINTMENT | Freq: Two times a day (BID) | CUTANEOUS | 0 refills | Status: DC

## 2021-11-22 LAB — COMPLETE METABOLIC PANEL WITH GFR
AG Ratio: 1.9 (calc) (ref 1.0–2.5)
ALT: 26 U/L (ref 9–46)
AST: 24 U/L (ref 10–35)
Albumin: 4.7 g/dL (ref 3.6–5.1)
Alkaline phosphatase (APISO): 60 U/L (ref 35–144)
BUN: 11 mg/dL (ref 7–25)
CO2: 28 mmol/L (ref 20–32)
Calcium: 9.5 mg/dL (ref 8.6–10.3)
Chloride: 101 mmol/L (ref 98–110)
Creat: 0.85 mg/dL (ref 0.70–1.30)
Globulin: 2.5 g/dL (calc) (ref 1.9–3.7)
Glucose, Bld: 101 mg/dL — ABNORMAL HIGH (ref 65–99)
Potassium: 4.5 mmol/L (ref 3.5–5.3)
Sodium: 138 mmol/L (ref 135–146)
Total Bilirubin: 0.7 mg/dL (ref 0.2–1.2)
Total Protein: 7.2 g/dL (ref 6.1–8.1)
eGFR: 101 mL/min/{1.73_m2} (ref >=60)

## 2021-11-22 LAB — CBC WITH DIFFERENTIAL/PLATELET
Absolute Monocytes: 490 cells/uL (ref 200–950)
Basophils Absolute: 63 cells/uL (ref 0–200)
Basophils Relative: 0.9 %
Eosinophils Absolute: 77 cells/uL (ref 15–500)
Eosinophils Relative: 1.1 %
HCT: 44.1 % (ref 38.5–50.0)
Hemoglobin: 14.8 g/dL (ref 13.2–17.1)
Lymphs Abs: 1743 cells/uL (ref 850–3900)
MCH: 31.8 pg (ref 27.0–33.0)
MCHC: 33.6 g/dL (ref 32.0–36.0)
MCV: 94.8 fL (ref 80.0–100.0)
MPV: 10.7 fL (ref 7.5–12.5)
Monocytes Relative: 7 %
Neutro Abs: 4627 cells/uL (ref 1500–7800)
Neutrophils Relative %: 66.1 %
Platelets: 300 10*3/uL (ref 140–400)
RBC: 4.65 10*6/uL (ref 4.20–5.80)
RDW: 11.8 % (ref 11.0–15.0)
Total Lymphocyte: 24.9 %
WBC: 7 10*3/uL (ref 3.8–10.8)

## 2021-11-22 LAB — LIPID PANEL
Cholesterol: 186 mg/dL (ref <200)
HDL: 71 mg/dL (ref >=40)
LDL Cholesterol (Calc): 86 mg/dL (calc)
Non-HDL Cholesterol (Calc): 115 mg/dL (calc) (ref <130)
Total CHOL/HDL Ratio: 2.6 (calc) (ref <5.0)
Triglycerides: 192 mg/dL — ABNORMAL HIGH (ref <150)

## 2021-11-22 LAB — VITAMIN D 25 HYDROXY (VIT D DEFICIENCY, FRACTURES): Vit D, 25-Hydroxy: 62 ng/mL (ref 30–100)

## 2021-12-08 ENCOUNTER — Other Ambulatory Visit: Payer: Self-pay | Admitting: Internal Medicine

## 2021-12-08 DIAGNOSIS — F419 Anxiety disorder, unspecified: Secondary | ICD-10-CM

## 2021-12-08 MED ORDER — ALPRAZOLAM 1 MG PO TABS: ORAL_TABLET | ORAL | 0 refills | Status: DC

## 2021-12-24 NOTE — Progress Notes (Unsigned)
Assessment and Plan:  There are no diagnoses linked to this encounter.    Further disposition pending results of labs. Discussed med's effects and SE's.   Over 30 minutes of exam, counseling, chart review, and critical decision making was performed.   Future Appointments  Date Time Provider [place]  12/25/2021  9:00 AM Magda Bernheim, NP [facility] None  02/26/2022 11:00 AM Unk Pinto, MD [facility] None  07/17/2022  8:30 AM Dohmeier, Asencion Partridge, MD [facility] None    ------------------------------------------------------------------------------------------------------------------   HPI There were no vitals taken for this visit. 56 y.o.male presents for  Lab Results  Component Value Date   PSA 0.73 01/24/2021   PSA 0.6 10/28/2019   PSA 0.6 06/29/2018      Past Medical History:  Diagnosis Date   Allergy    GERD (gastroesophageal reflux disease)    Hyperlipidemia    Hypertension    IBS (irritable bowel syndrome)    Traumatic pneumothorax 12/04/2015   Vitamin D deficiency      Allergies  Allergen Reactions   Lipitor [Atorvastatin]     Myalgias   Prozac [Fluoxetine Hcl]    Rosuvastatin     myalgias   Simvastatin     Myalgias    Current Outpatient Medications on File Prior to Visit  Medication Sig   ALPRAZolam (XANAX) 1 MG tablet Take  1/2 to 1 tablet  2 to 3 x / day  ONLY  if needed for Anxiety Attack & please try to limit to 5 days /week to avoid Addiction & Dementia                                                              /                                     TAKE                    BY                   MOUTH   cholecalciferol (VITAMIN D) 1000 UNITS tablet Take 5,000 Units by mouth daily.    co-enzyme Q-10 30 MG capsule Take 30 mg by mouth 3 (three) times daily.   famotidine (PEPCID) 20 MG tablet Take 1 tablet by mouth twice daily   lidocaine-prilocaine (EMLA) cream APPLY TOPICALLY AS NEEDED   lisinopril-hydrochlorothiazide (ZESTORETIC) 20-25 MG tablet  TAKE 1 TABLET BY MOUTH ONCE DAILY (FOR  BLOOD  PRESSURE  AND  FLUID  RETENTION/ANKLE  SWELLING) (Patient taking differently: 0.5 tablets. TAKE 1 TABLET BY MOUTH ONCE DAILY (FOR  BLOOD  PRESSURE  AND  FLUID  RETENTION/ANKLE  SWELLING))   montelukast (SINGULAIR) 10 MG tablet Take 1 tablet Daily for Allergies & Asthma / Patient knows to take by mouth   Multiple Vitamins-Minerals (MULTIVITAMIN PO) Take by mouth.   mupirocin ointment (BACTROBAN) 2 % Apply 1 application. topically 2 (two) times daily.   Omega-3 Fatty Acids (FISH OIL PO) Take by mouth.   omeprazole (PRILOSEC) 40 MG capsule TAKE 1 CAPSULE BY MOUTH DAILY TO PREVENT HEARTBURN AND INDIGESTION   PROCTO-MED HC 2.5 % rectal cream APPLY CREAM  RECTALLY 4 TIMES  DAILY   rosuvastatin (CRESTOR) 20 MG tablet Take 1 tablet Daily for Cholesterol   No current facility-administered medications on file prior to visit.    ROS: all negative except above.   Physical Exam:  There were no vitals taken for this visit.  General Appearance: Well nourished, in no apparent distress. Eyes: PERRLA, EOMs, conjunctiva no swelling or erythema Sinuses: No Frontal/maxillary tenderness ENT/Mouth: Ext aud canals clear, TMs without erythema, bulging. No erythema, swelling, or exudate on post pharynx.  Tonsils not swollen or erythematous. Hearing normal.  Neck: Supple, thyroid normal.  Respiratory: Respiratory effort normal, BS equal bilaterally without rales, rhonchi, wheezing or stridor.  Cardio: RRR with no MRGs. Brisk peripheral pulses without edema.  Abdomen: Soft, + BS.  Non tender, no guarding, rebound, hernias, masses. Lymphatics: Non tender without lymphadenopathy.  Musculoskeletal: Full ROM, 5/5 strength, normal gait.  Skin: Warm, dry without rashes, lesions, ecchymosis.  Neuro: Cranial nerves intact. Normal muscle tone, no cerebellar symptoms. Sensation intact.  Psych: Awake and oriented X 3, normal affect, Insight and Judgment appropriate.     Magda Bernheim, NP 10:04 AM [facility]

## 2021-12-25 ENCOUNTER — Encounter: Payer: Self-pay | Admitting: Nurse Practitioner

## 2021-12-25 ENCOUNTER — Ambulatory Visit (INDEPENDENT_AMBULATORY_CARE_PROVIDER_SITE_OTHER): Payer: BC Managed Care – PPO | Admitting: Nurse Practitioner

## 2021-12-25 VITALS — BP 128/82 | HR 86 | Temp 97.7°F | Wt 221.6 lb

## 2021-12-25 DIAGNOSIS — N138 Other obstructive and reflux uropathy: Secondary | ICD-10-CM | POA: Diagnosis not present

## 2021-12-25 DIAGNOSIS — M5440 Lumbago with sciatica, unspecified side: Secondary | ICD-10-CM | POA: Diagnosis not present

## 2021-12-25 DIAGNOSIS — R39198 Other difficulties with micturition: Secondary | ICD-10-CM

## 2021-12-25 DIAGNOSIS — N401 Enlarged prostate with lower urinary tract symptoms: Secondary | ICD-10-CM | POA: Diagnosis not present

## 2021-12-25 MED ORDER — CELECOXIB 100 MG PO CAPS: 100.0000 mg | ORAL_CAPSULE | Freq: Two times a day (BID) | ORAL | 2 refills | Status: DC

## 2021-12-25 NOTE — Patient Instructions (Addendum)
[facility] [address] in M-F 8:30-3:45 Lumbar spine xray  Trigger Finger  Trigger finger, also called stenosing tenosynovitis,  is a condition that causes a finger to get stuck in a bent position. Each finger has a tendon, which is a tough, cord-like tissue that connects muscle to bone, and each tendon passes through a tunnel of tissue called a tendon sheath. To move your finger, your tendon needs to glide freely through the sheath. Trigger finger happens when the tendon or the sheath thickens, making it difficult to move your finger. Trigger finger can affect any finger or a thumb. It may affect more than one finger. Mild cases may clear up with rest and medicine. Severe cases require more treatment. What are the causes? Trigger finger is caused by a thickened finger tendon or tendon sheath. The cause of this thickening is not known. What increases the risk? The following factors may make you more likely to develop this condition: Doing activities that require a strong grip. Having rheumatoid arthritis, gout, or diabetes. Being 54-54 years old. Being male. What are the signs or symptoms? Symptoms of this condition include: Pain when bending or straightening your finger. Tenderness or swelling where your finger attaches to the palm of your hand. A lump in the palm of your hand or on the inside of your finger. Hearing a noise like a pop or a snap when you try to straighten your finger. Feeling a catching or locking sensation when you try to straighten your finger. Being unable to straighten your finger. How is this diagnosed? This condition is diagnosed based on your symptoms and a physical exam. How is this treated? This condition may be treated by: Resting your finger and avoiding activities that make symptoms worse. Wearing a finger splint to keep your finger extended. Taking NSAIDs, such as ibuprofen, to relieve pain and swelling. Doing gentle exercises to stretch  the finger as told by your health care provider. Having medicine that reduces swelling and inflammation (steroids) injected into the tendon sheath. Injections may need to be repeated. Having surgery to open the tendon sheath. This may be done if other treatments do not work and you cannot straighten your finger. You may need physical therapy after surgery. Follow these instructions at home: If you have a splint: Wear the splint as told by your health care provider. Remove it only as told by your health care provider. Loosen it if your fingers tingle, become numb, or turn cold and blue. Keep it clean. If the splint is not waterproof: Do not let it get wet. Cover it with a watertight covering when you take a bath or shower. Managing pain, stiffness, and swelling     If directed, apply heat to the affected area as often as told by your health care provider. Use the heat source that your health care provider recommends, such as a moist heat pack or a heating pad. Place a towel between your skin and the heat source. Leave the heat on for 20-30 minutes. Remove the heat if your skin turns bright red. This is especially important if you are unable to feel pain, heat, or cold. You may have a greater risk of getting burned. If directed, put ice on the painful area. To do this: If you have a removable splint, remove it as told by your health care provider. Put ice in a plastic bag. Place a towel between your skin and the bag or between your splint and the bag. Leave  the ice on for 20 minutes, 2-3 times a day.  Activity Rest your finger as told by your health care provider. Avoid activities that make the pain worse. Return to your normal activities as told by your health care provider. Ask your health care provider what activities are safe for you. Do exercises as told by your health care provider. Ask your health care provider when it is safe to drive if you have a splint on your hand. General  instructions Take over-the-counter and prescription medicines only as told by your health care provider. Keep all follow-up visits as told by your health care provider. This is important. Contact a health care provider if: Your symptoms are not improving with home care. Summary Trigger finger, also called stenosing tenosynovitis, causes your finger to get stuck in a bent position. This can make it difficult and painful to straighten your finger. This condition develops when a finger tendon or tendon sheath thickens. Treatment may include resting your finger, wearing a splint, and taking medicines. In severe cases, surgery to open the tendon sheath may be needed. This information is not intended to replace advice given to you by your health care provider. Make sure you discuss any questions you have with your health care provider. Document Revised: 12/07/2018 Document Reviewed: 12/07/2018 Elsevier Patient Education  [place].

## 2021-12-26 ENCOUNTER — Ambulatory Visit
Admission: RE | Admit: 2021-12-26 | Discharge: 2021-12-26 | Disposition: A | Discharge status: Home / Self Care | Payer: BC Managed Care – PPO | Source: Ambulatory Visit | Attending: Nurse Practitioner | Admitting: Nurse Practitioner

## 2021-12-26 DIAGNOSIS — M5440 Lumbago with sciatica, unspecified side: Secondary | ICD-10-CM

## 2021-12-26 LAB — PSA: PSA: 0.62 ng/mL (ref <=4.00)

## 2021-12-26 LAB — URINE CULTURE
MICRO NUMBER:: 13434506
Result:: NO GROWTH
SPECIMEN QUALITY:: ADEQUATE

## 2022-01-08 ENCOUNTER — Other Ambulatory Visit: Payer: Self-pay

## 2022-01-08 MED ORDER — ROSUVASTATIN CALCIUM 20 MG PO TABS: ORAL_TABLET | ORAL | 3 refills | Status: DC

## 2022-01-24 ENCOUNTER — Encounter: Payer: BC Managed Care – PPO | Admitting: Internal Medicine

## 2022-02-26 ENCOUNTER — Encounter: Payer: BC Managed Care – PPO | Admitting: Internal Medicine

## 2022-03-18 ENCOUNTER — Other Ambulatory Visit: Payer: Self-pay | Admitting: Internal Medicine

## 2022-03-18 DIAGNOSIS — I10 Essential (primary) hypertension: Secondary | ICD-10-CM

## 2022-03-18 MED ORDER — LISINOPRIL-HYDROCHLOROTHIAZIDE 20-25 MG PO TABS: ORAL_TABLET | ORAL | 3 refills | Status: DC

## 2022-03-20 ENCOUNTER — Encounter: Payer: BC Managed Care – PPO | Admitting: Internal Medicine

## 2022-03-28 ENCOUNTER — Encounter: Payer: BC Managed Care – PPO | Admitting: Internal Medicine

## 2022-04-11 ENCOUNTER — Encounter: Payer: BC Managed Care – PPO | Admitting: Internal Medicine

## 2022-04-14 ENCOUNTER — Other Ambulatory Visit: Payer: Self-pay | Admitting: Internal Medicine

## 2022-04-14 DIAGNOSIS — Z1211 Encounter for screening for malignant neoplasm of colon: Secondary | ICD-10-CM

## 2022-04-14 MED ORDER — FAMOTIDINE 20 MG PO TABS: ORAL_TABLET | ORAL | 0 refills | Status: DC

## 2022-04-30 ENCOUNTER — Encounter: Payer: Self-pay | Admitting: Internal Medicine

## 2022-04-30 NOTE — Progress Notes (Unsigned)
Annual  Screening/Preventative Visit  & Comprehensive Evaluation & Examination  Future Appointments  Date Time Provider Department  05/01/2022                  cpe 11:00 AM Unk Pinto, MD [facility]  06/03/2022  8:30 AM Dohmeier, Asencion Partridge, MD [facility]  05/06/2023                   cpe 11:00 AM Unk Pinto, MD [facility]            This very nice 56 y.o. [name] presents for a Screening /Preventative Visit & comprehensive evaluation and management of multiple medical co-morbidities.  Patient has been followed for HTN, HLD, Prediabetes and Vitamin D Deficiency.       HTN predates since 2005. Patient's BP has been controlled at home.  Today's BP is at goal - 122/72 . Patient denies any cardiac symptoms as chest pain, palpitations, shortness of breath, dizziness or ankle swelling.       Patient's hyperlipidemia is controlled with diet and rosuvastatin. Patient denies myalgias or other medication SE's. Last lipids were not at goal:  Lab Results  Component Value Date   CHOL 186 11/21/2021   HDL 71 11/21/2021   LDLCALC 86 11/21/2021   TRIG 192 (H) 11/21/2021   CHOLHDL 2.6 11/21/2021          Patient is followed expectantly for glucose intolerance  and patient denies reactive hypoglycemic symptoms, visual blurring, diabetic polys or paresthesias. Last A1c was normal & at goal:   Lab Results  Component Value Date   HGBA1C 5.4 08/14/2021         Finally, patient has history of Vitamin D Deficiency ("34" /2016) and last vitamin D was at goal:   Lab Results  Component Value Date   VD25OH 62 11/21/2021        Current Outpatient Medications  Medication Instructions   ALPRAZolam (XANAX) 1 MG tablet Take  1/2 to 1 tablet  2 to 3 x / day  ONLY  if needed for Anxiety Attack & please try to limit to 5 days /week    celecoxib (CELEBREX)  100 mg 2 times daily   VITAMIN D  5,000 Units  Daily   co-enzyme Q-10       30 mg 3 times daily   famotidine  20 MG tablet Take  1 tablet  2  x /day    lidocaine-prilocaine (EMLA) cream APPLY TOPICALLY AS NEEDED   lisinopril-hctz 20-25 MG tablet Take  1/2 tablet  Daily     montelukast 10 MG tablet Take 1 tablet Daily for Allergies & Asthma    Multiple Vitamins-Minerals  Take daily   mupirocin oint 2 % 1 application , Topical, 2 times daily   Omega-3 FISH OIL Oral   omeprazole  40 MG capsule TAKE 1 CAPSULE DAILY    PROCTO-MED HC 2.5 % rectal cream APPLY  RECTALLY 4 TIMES DAILY   rosuvastatin (CRESTOR) 20 MG tablet Take 1 tablet Daily for Cholesterol     Allergies  Allergen Reactions   Lipitor Myalgias   Prozac  Myalgias   Rosuvastatin Myalgias   Simvastatin Myalgias    Past Medical History:  Diagnosis Date   Allergy    GERD (gastroesophageal reflux disease)    Hyperlipidemia    Hypertension    IBS (irritable bowel syndrome)    Traumatic pneumothorax 12/04/2015   Vitamin D deficiency     Health Maintenance  Topic Date  Due   COVID-19 Vaccine (1) Never done   Pneumococcal Vaccine  Never done   Zoster Vaccines- Shingrix (1 of 2) Never done   INFLUENZA VACCINE  03/05/2021   COLONOSCOPY  06/27/2025   TETANUS/TDAP  03/27/2026   Hepatitis C Screening  Completed   HIV Screening  Completed   HPV VACCINES  Aged Out     Immunization History  Administered Date(s) Administered   PPD Test 04/28/2017, 06/29/2018, 10/28/2019   Pneumococcal-23 01/06/2012   Td 06/10/2006   Tdap 03/27/2016    Last Colon - 06/28/2015 - Dr Silverio Decamp - Recc 10 year f/u due Dec 2026   Past Surgical History:  Procedure Laterality Date   CHEST TUBE INSERTION Right 05-15-02   thoracostomy   ESOPHAGOGASTRODUODENOSCOPY (EGD)  N/A 12/15/2020   ESOPHAGOGASTRODUODENOSCOPY (EGD) ;  Surgeon: Robert Bellow, MD   TOE SURGERY  2008   right big toe     Family History  Problem Relation Age of Onset   Cancer Mother        lung   Hyperlipidemia Mother    Hypertension Father    Hyperlipidemia Father    Dementia Brother    Hyperlipidemia  Brother    Hyperlipidemia Brother    Hyperlipidemia Brother    Colon cancer Maternal Grandmother    Diabetes Maternal Grandfather     Social History   Socioeconomic History   Marital status: Divorced   Number of children: Not on file  Occupational History   Not on file  Tobacco Use   Smoking status: Former    Pack years: 0.00    Types: Cigarettes    Quit date: 08/05/2001    Years since quitting: 19.4   Smokeless tobacco: Never   Tobacco comments:    Smoked 5-10 cigarettes per day since age 19  Substance and Sexual Activity   Alcohol use: Yes    Alcohol/week: 10.0 standard drinks    Types: 10 Standard drinks or equivalent per week   Drug use: No   Sexual activity: Not on file    ROS Constitutional: Denies fever, chills, weight loss/gain, headaches, insomnia,  night sweats or change in appetite. Does c/o fatigue. Eyes: Denies redness, blurred vision, diplopia, discharge, itchy or watery eyes.  ENT: Denies discharge, congestion, post nasal drip, epistaxis, sore throat, earache, hearing loss, dental pain, Tinnitus, Vertigo, Sinus pain or snoring.  Cardio: Denies chest pain, palpitations, irregular heartbeat, syncope, dyspnea, diaphoresis, orthopnea, PND, claudication or edema Respiratory: denies cough, dyspnea, DOE, pleurisy, hoarseness, laryngitis or wheezing.  Gastrointestinal: Denies dysphagia, heartburn, reflux, water brash, pain, cramps, nausea, vomiting, bloating, diarrhea, constipation, hematemesis, melena, hematochezia, jaundice or hemorrhoids Genitourinary: Denies dysuria, frequency, urgency, nocturia, hesitancy, discharge, hematuria or flank pain Musculoskeletal: Denies arthralgia, myalgia, stiffness, Jt. Swelling, pain, limp or strain/sprain. Denies Falls. Skin: Denies puritis, rash, hives, warts, acne, eczema or change in skin lesion Neuro: No weakness, tremor, incoordination, spasms, paresthesia or pain Psychiatric: Denies confusion, memory loss or sensory loss. Denies  Depression. Endocrine: Denies change in weight, skin, hair change, nocturia, and paresthesia, diabetic polys, visual blurring or hyper / hypo glycemic episodes.  Heme/Lymph: No excessive bleeding, bruising or enlarged lymph nodes.   Physical Exam  BP 122/72   Pulse 84   Temp 97.7 F (36.5 C)   Resp 16   Ht '5\' 10"'$  (1.778 m)   Wt 225 lb 12.8 oz (102.4 kg)   SpO2 97%   BMI 32.40 kg/m   General Appearance: Well nourished and well groomed and in  no apparent distress.  Eyes: PERRLA, EOMs, conjunctiva no swelling or erythema, normal fundi and vessels. Sinuses: No frontal/maxillary tenderness ENT/Mouth: EACs patent / TMs  nl. Nares clear without erythema, swelling, mucoid exudates. Oral hygiene is good. No erythema, swelling, or exudate. Tongue normal, non-obstructing. Tonsils not swollen or erythematous. Hearing normal.  Neck: Supple, thyroid not palpable. No bruits, nodes or JVD. Respiratory: Respiratory effort normal.  BS equal and clear bilateral without rales, rhonci, wheezing or stridor. Cardio: Heart sounds are normal with regular rate and rhythm and no murmurs, rubs or gallops. Peripheral pulses are normal and equal bilaterally without edema. No aortic or femoral bruits. Chest: symmetric with normal excursions and percussion.  Abdomen: Soft, with Nl bowel sounds. Nontender, no guarding, rebound, hernias, masses, or organomegaly.  Lymphatics: Non tender without lymphadenopathy.  Musculoskeletal: Full ROM all peripheral extremities, joint stability, 5/5 strength, and normal gait. Skin: Warm and dry without rashes, lesions, cyanosis, clubbing or  ecchymosis.  Neuro: Cranial nerves intact, reflexes equal bilaterally. Normal muscle tone, no cerebellar symptoms. Sensation intact.  Pysch: Alert and oriented X 3 with normal affect, insight and judgment appropriate.   Assessment and Plan  1. Annual Preventative/Screening Exam   2. Essential hypertension  - EKG 12-Lead - [place],  RETROPERITNL ABD,  LTD - Urinalysis, Routine w reflex microscopic - Microalbumin / creatinine urine ratio - CBC with Differential/Platelet - COMPLETE METABOLIC PANEL WITH GFR - Magnesium - TSH  3. Hyperlipidemia, mixed  - EKG 12-Lead - [place], RETROPERITNL ABD,  LTD - Lipid panel - TSH  4. Abnormal glucose  - EKG 12-Lead - [place], RETROPERITNL ABD,  LTD - Hemoglobin A1c - Insulin, random  5. Vitamin D deficiency  - VITAMIN D 25 Hydroxy   6. Gastroesophageal reflux disease    7. Screening for colorectal cancer  - POC Hemoccult Bld/Stl  - CBC with Differential/Platelet  8. Prostate cancer screening  - PSA  9. Screening for heart disease  - EKG 12-Lead  10. FHx: heart disease  - EKG 12-Lead - [place], RETROPERITNL ABD,  LTD  11. Former smoker  - EKG 12-Lead - [place], RETROPERITNL ABD,  LTD  12. Screening for AAA (aortic abdominal aneurysm)  - [place], RETROPERITNL ABD,  LTD  13. Fatigue, unspecified type  - Iron, Total/Total Iron Binding Cap - Vitamin B12 - Testosterone - CBC with Differential/Platelet - TSH  14. Medication management  - Urinalysis, Routine w reflex microscopic - Microalbumin / creatinine urine ratio - CBC with Differential/Platelet - COMPLETE METABOLIC PANEL WITH GFR - Magnesium - Lipid panel - TSH - Hemoglobin A1c - Insulin, random - VITAMIN D 25 Hydroxy         Patient was counseled in prudent diet, weight control to achieve/maintain BMI less than 25, BP monitoring, regular exercise and medications as discussed.  Discussed med effects and SE's. Routine screening labs and tests as requested with regular follow-up as recommended. Over 40 minutes of exam, counseling, chart review and high complex critical decision making was performed   Kirtland Bouchard, MD

## 2022-04-30 NOTE — Patient Instructions (Signed)

## 2022-05-01 ENCOUNTER — Ambulatory Visit (INDEPENDENT_AMBULATORY_CARE_PROVIDER_SITE_OTHER): Payer: BC Managed Care – PPO | Admitting: Internal Medicine

## 2022-05-01 ENCOUNTER — Encounter: Payer: Self-pay | Admitting: Internal Medicine

## 2022-05-01 VITALS — BP 122/72 | HR 84 | Temp 97.7°F | Resp 16 | Ht 70.0 in | Wt 225.8 lb

## 2022-05-01 DIAGNOSIS — Z0000 Encounter for general adult medical examination without abnormal findings: Secondary | ICD-10-CM | POA: Diagnosis not present

## 2022-05-01 DIAGNOSIS — Z1389 Encounter for screening for other disorder: Secondary | ICD-10-CM

## 2022-05-01 DIAGNOSIS — I10 Essential (primary) hypertension: Secondary | ICD-10-CM | POA: Diagnosis not present

## 2022-05-01 DIAGNOSIS — Z1211 Encounter for screening for malignant neoplasm of colon: Secondary | ICD-10-CM

## 2022-05-01 DIAGNOSIS — Z136 Encounter for screening for cardiovascular disorders: Secondary | ICD-10-CM | POA: Diagnosis not present

## 2022-05-01 DIAGNOSIS — E559 Vitamin D deficiency, unspecified: Secondary | ICD-10-CM

## 2022-05-01 DIAGNOSIS — Z131 Encounter for screening for diabetes mellitus: Secondary | ICD-10-CM

## 2022-05-01 DIAGNOSIS — Z125 Encounter for screening for malignant neoplasm of prostate: Secondary | ICD-10-CM | POA: Diagnosis not present

## 2022-05-01 DIAGNOSIS — I700 Atherosclerosis of aorta: Secondary | ICD-10-CM

## 2022-05-01 DIAGNOSIS — Z130 Encounter for screening for diseases of the blood and blood-forming organs and certain disorders involving the immune mechanism: Secondary | ICD-10-CM

## 2022-05-01 DIAGNOSIS — K219 Gastro-esophageal reflux disease without esophagitis: Secondary | ICD-10-CM

## 2022-05-01 DIAGNOSIS — Z13220 Encounter for screening for lipoid disorders: Secondary | ICD-10-CM

## 2022-05-01 DIAGNOSIS — Z79899 Other long term (current) drug therapy: Secondary | ICD-10-CM

## 2022-05-01 DIAGNOSIS — R350 Frequency of micturition: Secondary | ICD-10-CM

## 2022-05-01 DIAGNOSIS — R7309 Other abnormal glucose: Secondary | ICD-10-CM

## 2022-05-01 DIAGNOSIS — Z0001 Encounter for general adult medical examination with abnormal findings: Secondary | ICD-10-CM

## 2022-05-01 DIAGNOSIS — Z8249 Family history of ischemic heart disease and other diseases of the circulatory system: Secondary | ICD-10-CM

## 2022-05-01 DIAGNOSIS — Z1329 Encounter for screening for other suspected endocrine disorder: Secondary | ICD-10-CM | POA: Diagnosis not present

## 2022-05-01 DIAGNOSIS — R5383 Other fatigue: Secondary | ICD-10-CM

## 2022-05-01 DIAGNOSIS — Z87891 Personal history of nicotine dependence: Secondary | ICD-10-CM

## 2022-05-01 DIAGNOSIS — N401 Enlarged prostate with lower urinary tract symptoms: Secondary | ICD-10-CM

## 2022-05-01 DIAGNOSIS — E782 Mixed hyperlipidemia: Secondary | ICD-10-CM

## 2022-05-02 ENCOUNTER — Telehealth: Payer: Self-pay | Admitting: Internal Medicine

## 2022-05-02 ENCOUNTER — Other Ambulatory Visit: Payer: Self-pay | Admitting: Internal Medicine

## 2022-05-02 LAB — IRON, TOTAL/TOTAL IRON BINDING CAP
%SAT: 46 % (calc) (ref 20–48)
Iron: 175 ug/dL (ref 50–180)
TIBC: 380 mcg/dL (calc) (ref 250–425)

## 2022-05-02 LAB — URINALYSIS, ROUTINE W REFLEX MICROSCOPIC
Bilirubin Urine: NEGATIVE
Glucose, UA: NEGATIVE
Hgb urine dipstick: NEGATIVE
Ketones, ur: NEGATIVE
Leukocytes,Ua: NEGATIVE
Nitrite: NEGATIVE
Protein, ur: NEGATIVE
Specific Gravity, Urine: 1.019 (ref 1.001–1.035)
pH: 7 (ref 5.0–8.0)

## 2022-05-02 LAB — COMPLETE METABOLIC PANEL WITH GFR
AG Ratio: 1.9 (calc) (ref 1.0–2.5)
ALT: 38 U/L (ref 9–46)
AST: 32 U/L (ref 10–35)
Albumin: 4.7 g/dL (ref 3.6–5.1)
Alkaline phosphatase (APISO): 63 U/L (ref 35–144)
BUN: 12 mg/dL (ref 7–25)
CO2: 26 mmol/L (ref 20–32)
Calcium: 9.6 mg/dL (ref 8.6–10.3)
Chloride: 100 mmol/L (ref 98–110)
Creat: 1.06 mg/dL (ref 0.70–1.30)
Globulin: 2.5 g/dL (calc) (ref 1.9–3.7)
Glucose, Bld: 92 mg/dL (ref 65–99)
Potassium: 4.4 mmol/L (ref 3.5–5.3)
Sodium: 137 mmol/L (ref 135–146)
Total Bilirubin: 0.8 mg/dL (ref 0.2–1.2)
Total Protein: 7.2 g/dL (ref 6.1–8.1)
eGFR: 81 mL/min/{1.73_m2} (ref >=60)

## 2022-05-02 LAB — INSULIN, RANDOM: Insulin: 8.6 u[IU]/mL

## 2022-05-02 LAB — TSH: TSH: 1.66 mIU/L (ref 0.40–4.50)

## 2022-05-02 LAB — CBC WITH DIFFERENTIAL/PLATELET
Absolute Monocytes: 448 cells/uL (ref 200–950)
Basophils Absolute: 49 cells/uL (ref 0–200)
Basophils Relative: 0.9 %
Eosinophils Absolute: 59 cells/uL (ref 15–500)
Eosinophils Relative: 1.1 %
HCT: 44.3 % (ref 38.5–50.0)
Hemoglobin: 15 g/dL (ref 13.2–17.1)
Lymphs Abs: 1782 cells/uL (ref 850–3900)
MCH: 32.1 pg (ref 27.0–33.0)
MCHC: 33.9 g/dL (ref 32.0–36.0)
MCV: 94.7 fL (ref 80.0–100.0)
MPV: 10.8 fL (ref 7.5–12.5)
Monocytes Relative: 8.3 %
Neutro Abs: 3062 cells/uL (ref 1500–7800)
Neutrophils Relative %: 56.7 %
Platelets: 278 10*3/uL (ref 140–400)
RBC: 4.68 10*6/uL (ref 4.20–5.80)
RDW: 11.8 % (ref 11.0–15.0)
Total Lymphocyte: 33 %
WBC: 5.4 10*3/uL (ref 3.8–10.8)

## 2022-05-02 LAB — MICROALBUMIN / CREATININE URINE RATIO
Creatinine, Urine: 130 mg/dL (ref 20–320)
Microalb Creat Ratio: 8 mcg/mg creat (ref <30)
Microalb, Ur: 1 mg/dL

## 2022-05-02 LAB — LIPID PANEL
Cholesterol: 250 mg/dL — ABNORMAL HIGH (ref <200)
HDL: 57 mg/dL (ref >=40)
Non-HDL Cholesterol (Calc): 193 mg/dL (calc) — ABNORMAL HIGH (ref <130)
Total CHOL/HDL Ratio: 4.4 (calc) (ref <5.0)
Triglycerides: 435 mg/dL — ABNORMAL HIGH (ref <150)

## 2022-05-02 LAB — TESTOSTERONE: Testosterone: 451 ng/dL (ref 250–827)

## 2022-05-02 LAB — PSA: PSA: 0.9 ng/mL (ref <=4.00)

## 2022-05-02 LAB — MAGNESIUM: Magnesium: 1.9 mg/dL (ref 1.5–2.5)

## 2022-05-02 LAB — VITAMIN D 25 HYDROXY (VIT D DEFICIENCY, FRACTURES): Vit D, 25-Hydroxy: 44 ng/mL (ref 30–100)

## 2022-05-02 LAB — HEMOGLOBIN A1C
Hgb A1c MFr Bld: 5.3 % of total Hgb (ref <5.7)
Mean Plasma Glucose: 105 mg/dL
eAG (mmol/L): 5.8 mmol/L

## 2022-05-02 LAB — VITAMIN B12: Vitamin B-12: 799 pg/mL (ref 200–1100)

## 2022-05-02 MED ORDER — NITROGLYCERIN 2 % TD OINT: TOPICAL_OINTMENT | TRANSDERMAL | 3 refills | Status: AC

## 2022-05-02 NOTE — Telephone Encounter (Signed)
Approx. Two years ago a specialist prescribed Nitroglycerin cream for his hemorrhoids. He meant to ask you at his appt. If you would refill it for him. He has had a flare up. If so, will you send it to [facility] at [place].

## 2022-05-02 NOTE — Progress Notes (Signed)
<><><><><><><><><><><><><><><><><><><><><><><><><><><><><><><><><> <><><><><><><><><><><><><><><><><><><><><><><><><><><><><><><><><>  - Total Chol =  250       is very high risk for Heart Attack /Stroke /Vascular Dementia     ( Ideal or Goal is less than 180 ! )   - Treating with meds to lower Cholesterol is treating the result                                          & NOT treating the cause - The cause is Bad Diet !  - Still suggest that you Re-Start your Cholesterol Med & take it EVERY day  !    - Read or listen to   Dr Wyman Songster 's book    " How Not to Die ! "   - Recommend a stricter plant based low cholesterol diet   - Cholesterol only comes from animal sources                                                                          - ie. meat, dairy, egg yolks  - Eat all the vegetables you want.  - Avoid meat, Avoid Meat,  Avoid    Meat                                                          - especially red meat - Beef AND Pork .  - Avoid cheese & dairy - milk & ice cream.    - Cheese is the most concentrated form of trans-fats which  is the worst thing to clog up our arteries.   - Veggie cheese is OK which can be found in the fresh  produce section at [employer] or [employer] or [employer] <><><><><><><><><><><><><><><><><><><><><><><><><><><><><><><><><> <><><><><><><><><><><><><><><><><><><><><><><><><><><><><><><><><>  -  Also Triglycerides (   435   ) or fats in blood are too high                 (   Ideal or  Goal is less than 150  !  )    - Recommend avoid fried & greasy foods,  sweets / candy,   - Avoid white rice  (brown or wild rice or Quinoa is OK),   - Avoid white potatoes  (sweet potatoes are OK)   - Avoid anything made from white flour  - bagels, doughnuts, rolls, buns, biscuits, white and   wheat breads, pizza crust and traditional  pasta made of white flour & egg  white  - (vegetarian pasta or spinach or wheat pasta is OK).    - Multi-grain bread is OK - like multi-grain flat bread or  sandwich thins.   - Avoid alcohol in excess.   - Exercise is also important. <><><><><><><><><><><><><><><><><><><><><><><><><><><><><><><><><>  -  Iron & Vit B12 levels OK  <><><><><><><><><><><><><><><><><><><><><><><><><><><><><><><><><>  -  PSA - Low - Great ! <><><><><><><><><><><><><><><><><><><><><><><><><><><><><><><><><>  -  A1c - Normal - No Diabetes   - Great ! <><><><><><><><><><><><><><><><><><><><><><><><><><><><><><><><><>  -  Vit D = 44 - is way low - AGAIN - Recommend increase to 10,000 units / day  <><><><><><><><><><><><><><><><><><><><><><><><><><><><><><><><><> <><><><><><><><><><><><><><><><><><><><><><><><><><><><><><><><><>  -  All Else - CBC - Kidneys - Electrolytes - Liver - Magnesium & Thyroid    - all  Normal / OK  <><><><><><><><><><><><><><><><><><><><><><><><><><><><><><><><><> <><><><><><><><><><><><><><><><><><><><><><><><><><><><><><><><><>

## 2022-05-28 ENCOUNTER — Other Ambulatory Visit: Payer: Self-pay

## 2022-05-28 DIAGNOSIS — Z1211 Encounter for screening for malignant neoplasm of colon: Secondary | ICD-10-CM

## 2022-05-28 DIAGNOSIS — Z1212 Encounter for screening for malignant neoplasm of rectum: Secondary | ICD-10-CM | POA: Diagnosis not present

## 2022-05-28 LAB — POC HEMOCCULT BLD/STL (HOME/3-CARD/SCREEN)
Card #3 Fecal Occult Blood, POC: NEGATIVE
Fecal Occult Blood, POC: NEGATIVE

## 2022-06-03 ENCOUNTER — Ambulatory Visit: Payer: BC Managed Care – PPO | Admitting: Neurology

## 2022-06-03 ENCOUNTER — Telehealth: Payer: Self-pay | Admitting: Neurology

## 2022-06-03 NOTE — Telephone Encounter (Signed)
Matthew Salinas called and stated she has Covid and now Pt is feeling good and needed to cancel appointment.

## 2022-06-11 ENCOUNTER — Telehealth: Payer: Self-pay | Admitting: Neurology

## 2022-06-11 NOTE — Telephone Encounter (Signed)
Pt called back and accepted sooner appointment with dr. Brett Fairy.

## 2022-06-11 NOTE — Telephone Encounter (Signed)
Noted  

## 2022-06-11 NOTE — Telephone Encounter (Signed)
LVM for pt offering sooner appt with Dr. Brett Fairy 06/24/22 at 10am. Asked him to call back before 5pm today to let us know if this works for him.

## 2022-06-11 NOTE — Telephone Encounter (Signed)
Pt notifying have not been using  the CPAP machine.Stop using the machine in March. Tried a few more times and could not tolerate using the CPAP  machine. Would like a call from the nurse.  Scheduled appt on 09/17/22 at 3:30p

## 2022-06-18 ENCOUNTER — Ambulatory Visit: Payer: BC Managed Care – PPO | Admitting: Neurology

## 2022-06-24 ENCOUNTER — Telehealth: Payer: Self-pay | Admitting: Neurology

## 2022-06-24 ENCOUNTER — Ambulatory Visit: Payer: BC Managed Care – PPO | Admitting: Neurology

## 2022-06-24 ENCOUNTER — Encounter: Payer: Self-pay | Admitting: Neurology

## 2022-06-24 VITALS — BP 144/91 | HR 80 | Ht 71.0 in | Wt 231.0 lb

## 2022-06-24 DIAGNOSIS — J351 Hypertrophy of tonsils: Secondary | ICD-10-CM | POA: Diagnosis not present

## 2022-06-24 DIAGNOSIS — Z789 Other specified health status: Secondary | ICD-10-CM

## 2022-06-24 DIAGNOSIS — R0683 Snoring: Secondary | ICD-10-CM | POA: Diagnosis not present

## 2022-06-24 DIAGNOSIS — G4733 Obstructive sleep apnea (adult) (pediatric): Secondary | ICD-10-CM | POA: Diagnosis not present

## 2022-06-24 NOTE — Telephone Encounter (Signed)
Referral for dentistry faxed to Dr. Ron Parker. Phone: [phone], Fax: [phone]

## 2022-06-24 NOTE — Patient Instructions (Signed)
Living With Sleep Apnea Sleep apnea is a condition in which breathing pauses or becomes shallow during sleep. Sleep apnea is most commonly caused by a collapsed or blocked airway. People with sleep apnea usually snore loudly. They may have times when they gasp and stop breathing for 10 seconds or more during sleep. This may happen many times during the night. The breaks in breathing also interrupt the deep sleep that you need to feel rested. Even if you do not completely wake up from the gaps in breathing, your sleep may not be restful and you feel tired during the day. You may also have a headache in the morning and low energy during the day, and you may feel anxious or depressed. How can sleep apnea affect me? Sleep apnea increases your chances of extreme tiredness during the day (daytime fatigue). It can also increase your risk for health conditions, such as: Heart attack. Stroke. Obesity. Type 2 diabetes. Heart failure. Irregular heartbeat. High blood pressure. If you have daytime fatigue as a result of sleep apnea, you may be more likely to: Perform poorly at school or work. Fall asleep while driving. Have difficulty with attention. Develop depression or anxiety. Have sexual dysfunction. What actions can I take to manage sleep apnea? Sleep apnea treatment  If you were given a device to open your airway while you sleep, use it only as told by your health care provider. You may be given: An oral appliance. This is a custom-made mouthpiece that shifts your lower jaw forward. A continuous positive airway pressure (CPAP) device. This device blows air through a mask when you breathe out (exhale). A nasal expiratory positive airway pressure (EPAP) device. This device has valves that you put into each nostril. A bi-level positive airway pressure (BIPAP) device. This device blows air through a mask when you breathe in (inhale) and breathe out (exhale). You may need surgery if other treatments  do not work for you. Sleep habits Go to sleep and wake up at the same time every day. This helps set your internal clock (circadian rhythm) for sleeping. If you stay up later than usual, such as on weekends, try to get up in the morning within 2 hours of your normal wake time. Try to get at least 7-9 hours of sleep each night. Stop using a computer, tablet, and mobile phone a few hours before bedtime. Do not take long naps during the day. If you nap, limit it to 30 minutes. Have a relaxing bedtime routine. Reading or listening to music may relax you and help you sleep. Use your bedroom only for sleep. Keep your television and computer out of your bedroom. Keep your bedroom cool, dark, and quiet. Use a supportive mattress and pillows. Follow your health care provider's instructions for other changes to sleep habits. Nutrition Do not eat heavy meals in the evening. Do not have caffeine in the later part of the day. The effects of caffeine can last for more than 5 hours. Follow your health care provider's or dietitian's instructions for any diet changes. Lifestyle     Do not drink alcohol before bedtime. Alcohol can cause you to fall asleep at first, but then it can cause you to wake up in the middle of the night and have trouble getting back to sleep. Do not use any products that contain nicotine or tobacco. These products include cigarettes, chewing tobacco, and vaping devices, such as e-cigarettes. If you need help quitting, ask your health care provider. Medicines Take   over-the-counter and prescription medicines only as told by your health care provider. Do not use over-the-counter sleep medicine. You can become dependent on this medicine, and it can make sleep apnea worse. Do not use medicines, such as sedatives and narcotics, unless told by your health care provider. Activity Exercise on most days, but avoid exercising in the evening. Exercising near bedtime can interfere with  sleeping. If possible, spend time outside every day. Natural light helps regulate your circadian rhythm. General information Lose weight if you need to, and maintain a healthy weight. Keep all follow-up visits. This is important. If you are having surgery, make sure to tell your health care provider that you have sleep apnea. You may need to bring your device with you. Where to find more information Learn more about sleep apnea and daytime fatigue from: American Sleep Association: sleepassociation.org National Sleep Foundation: sleepfoundation.org National Heart, Lung, and Blood Institute: nhlbi.nih.gov Summary Sleep apnea is a condition in which breathing pauses or becomes shallow during sleep. Sleep apnea can cause daytime fatigue and other serious health conditions. You may need to wear a device while sleeping to help keep your airway open. If you are having surgery, make sure to tell your health care provider that you have sleep apnea. You may need to bring your device with you. Making changes to sleep habits, diet, lifestyle, and activity can help you manage sleep apnea. This information is not intended to replace advice given to you by your health care provider. Make sure you discuss any questions you have with your health care provider. Document Revised: 02/28/2021 Document Reviewed: 06/30/2020 Elsevier Patient Education  2023 [employer].  

## 2022-06-24 NOTE — Progress Notes (Signed)
SLEEP MEDICINE CLINIC    Provider:  Larey Seat, MD  Primary Care Physician:  Unk Pinto, MD [address] [place] [address]     Referring Provider: Unk Pinto, [place],  [place] [address]          Chief Complaint according to patient   Patient presents with:     New Patient (Initial Visit)           HISTORY OF PRESENT ILLNESS:  Matthew Salinas is a 56 y.o. year old Caucasian male patient seen here on 06/24/2022 from Dr Melford Aase for RV on 06-24-2022:  I had the pleasure of meeting today with Matthew Salinas again and meanwhile 56 year old gentleman who has been diagnosed with severe REM sleep dependent apnea and 25% central sleep apnea was placed on auto titration CPAP, while the CPAP reduced his apneas and hypopneas very well he could not tolerate CPAP.  So we are meeting to discuss alternatives.   He still describes having restless sleep which is keeping him moving at night, displacing the CPAP interface, creating air leaks,. He is not able to tolerate supine sleep because of back pain and leg pain.  He has used a mouthguard which according to him and his spouse has reduced his snoring this is not a major to measure device. Could he use a dental device ?  He does not want to have his large tonsils removed and he is not a smoker. He had a pneumothorax in 2000.      !09-16-2020: [name] underwent a home sleep test on 05-28-2021 and reached 33% REM sleep during this test he slept for a total 8 hours and 33 minutes.  The mean snoring volume was 42 dB.  50% of his sleep were accompanied by brother loud snoring.  In supine sleep he reached 54 apneas per hour and overall his AHI was 37.9.  I ordered an auto titration device which the patient has used compliantly for 86% of the time and date with an average use at time of 4 hours 59 minutes.  This machine is set between 6 and 18 cmH2O pressure and allows  3 cm reduction for expiratory pressure.  The residual AHI is 1.7/h which is a significant improvement.  The patient feels more rested when he uses his CPAP the nasal pillow however has been difficult for him as he tends to develop air leaks or venting.  The 95th percentile pressure was only 8.4 cmH2O and I would like for the patient to continue with the current settings to general approach to the medical equipment company about a full facemask.  We have to be aware of his facial hair that may interfere with the air seal.  His current fatigue severity score is 12 points much down from his initial visit his Epworth sleepiness score was endorsed at 4 points also much reduced in comparison to before.    Chief concern according to patient :   I have the pleasure of seeing Matthew Salinas today, a right -handed Caucasian male with a known sleep disorder.      He has a past medical history of Allergy, GERD (gastroesophageal reflux disease), Hyperlipidemia, Hypertension, IBS (irritable bowel syndrome), Traumatic pneumothorax (12/04/2015), and Vitamin D deficiency..  He has very large tonsils, and he does sometimes have a choking episode when sleeping on his back.  His wife has also noticed snoring and apneic breathing.  He  had 2 rotator cuff tears and cannot sleep easily on his side for that reason.   The patient had the first sleep study in the year 2017  with a result of an AHI ( Apnea Hypopnea index)  of 1.5,   Sleep relevant medical history: he can't sleep easily in a lab and wants now a HST-   Family medical /sleep history: No other family member on CPAP with OSA, insomnia, sleep walkers.    Social history:  Patient is working as a Administrator , and lives in a household spouse, children are gone. The patient currently works part time-. Pets are present. Tobacco use- quit 20 years ago. Marland Kitchen  ETOH use rare ,  Caffeine intake in form of Coffee( 1 up a day) Soda( /) Tea ( for dinner ) or energy  drinks. Regular exercise in form of .   Hobbies :Careers information officer.       Sleep habits are as follows:  The patient's dinner time is between 7 PM. The patient goes to bed at 9.30 PM and continues to sleep for intervals of 1-2 hours-  hours, wakes from pain in shoulders. Has 1-2 bathroom breaks, the first time at 2 AM.   The preferred sleep position is supine, with the support of 1-2 pillows. Dreams are reportedly frequent/vivid.  6  AM is the usual rise time. The patient wakes up spontaneously .  He reports not feeling refreshed or restored in AM, with symptoms such as dry mouth, morning headaches, and stiffness, residual fatigue. Naps are taken infrequently, seldomly.   Review of Systems: Out of a complete 14 system review, the patient complains of only the following symptoms, and all other reviewed systems are negative.:  Fatigue, sleepiness , snoring, fragmented sleep, Insomnia mind racing, pain , choking.    How likely are you to doze in the following situations: 0 = not likely, 1 = slight chance, 2 = moderate chance, 3 = high chance   Sitting and Reading? Watching Television? Sitting inactive in a public place (theater or meeting)? As a passenger in a car for an hour without a break? Lying down in the afternoon when circumstances permit? Sitting and talking to someone? Sitting quietly after lunch without alcohol? In a car, while stopped for a few minutes in traffic?   Total = 5/ 24 points   FSS endorsed at  12 , down from 19/ 63 points.   Xanax has been discontinued.   Social History   Socioeconomic History   Marital status: Divorced    Spouse name: Not on file   Number of children: Not on file   Years of education: Not on file   Highest education level: Not on file  Occupational History   Not on file  Tobacco Use   Smoking status: Former    Types: Cigarettes    Quit date: 08/05/2001    Years since quitting: 20.8   Smokeless tobacco: Never   Tobacco comments:     Smoked 5-10 cigarettes per day since age 31  Substance and Sexual Activity   Alcohol use: Yes    Alcohol/week: 10.0 standard drinks of alcohol    Types: 10 Standard drinks or equivalent per week   Drug use: No   Sexual activity: Not on file  Other Topics Concern   Not on file  Social History Narrative   Not on file   Social Determinants of Health   Financial Resource Strain: Not on file  Food Insecurity: Not  on file  Transportation Needs: Not on file  Physical Activity: Not on file  Stress: Not on file  Social Connections: Not on file    Family History  Problem Relation Age of Onset   Cancer Mother        lung   Hyperlipidemia Mother    Hypertension Father    Hyperlipidemia Father    Dementia Brother    Hyperlipidemia Brother    Hyperlipidemia Brother    Hyperlipidemia Brother    Colon cancer Maternal Grandmother    Diabetes Maternal Grandfather     Past Medical History:  Diagnosis Date   Allergy    GERD (gastroesophageal reflux disease)    Hyperlipidemia    Hypertension    IBS (irritable bowel syndrome)    Traumatic pneumothorax 12/04/2015   Vitamin D deficiency     Past Surgical History:  Procedure Laterality Date   CHEST TUBE INSERTION Right 05-15-02   thoracostomy   ESOPHAGOGASTRODUODENOSCOPY (EGD) WITH PROPOFOL N/A 12/15/2020   Procedure: ESOPHAGOGASTRODUODENOSCOPY (EGD) WITH PROPOFOL;  Surgeon: Robert Bellow, MD;  Location: [facility] ENDOSCOPY;  Service: Endoscopy;  Laterality: N/A;  with possible dilation   TOE SURGERY  2008   right big toe     Current Outpatient Medications on File Prior to Visit  Medication Sig Dispense Refill   celecoxib (CELEBREX) 100 MG capsule Take 1 capsule (100 mg total) by mouth 2 (two) times daily. (Patient taking differently: Take 100 mg by mouth as needed for moderate pain.) 60 capsule 2   cholecalciferol (VITAMIN D) 1000 UNITS tablet Take 5,000 Units by mouth daily.      co-enzyme Q-10 30 MG capsule Take 30 mg by mouth 3  (three) times daily.     famotidine (PEPCID) 20 MG tablet Take  1 tablet  2 x /day to Prevent Heartburn & Indigestion                                         /                         TAKE                           BY                             MOUTH 60 tablet 0   lidocaine-prilocaine (EMLA) cream APPLY TOPICALLY AS NEEDED 90 g 11   lisinopril-hydrochlorothiazide (ZESTORETIC) 20-25 MG tablet Take  1/2 tablet  Daily  for BP                                                                 /                                   TAKE                              BY  MOUTH                                  ONCE DAILY 46 tablet 3   meloxicam (MOBIC) 15 MG tablet Take 15 mg by mouth daily.     montelukast (SINGULAIR) 10 MG tablet Take 1 tablet Daily for Allergies & Asthma / Patient knows to take by mouth 90 tablet 3   Multiple Vitamins-Minerals (MULTIVITAMIN PO) Take by mouth.     mupirocin ointment (BACTROBAN) 2 % Apply 1 application. topically 2 (two) times daily. 22 g 0   nitroGLYCERIN (NITROGLYN) 2 % ointment Apply   1/2 "  to Hemorrhoid  every 6 hours  as needed 60 g 3   Omega-3 Fatty Acids (FISH OIL PO) Take by mouth.     omeprazole (PRILOSEC) 40 MG capsule TAKE 1 CAPSULE BY MOUTH DAILY TO PREVENT HEARTBURN AND INDIGESTION 90 capsule 1   PROCTO-MED HC 2.5 % rectal cream APPLY CREAM  RECTALLY 4 TIMES DAILY 56 g 3   rosuvastatin (CRESTOR) 20 MG tablet Take 1 tablet Daily for Cholesterol 90 tablet 3   ALPRAZolam (XANAX) 1 MG tablet Take  1/2 to 1 tablet  2 to 3 x / day  ONLY  if needed for Anxiety Attack & please try to limit to 5 days /week to avoid Addiction & Dementia                                                              /                                     TAKE                    BY                   MOUTH (Patient not taking: Reported on 06/24/2022) 30 tablet 0   No current facility-administered medications on file prior to visit.    Allergies  Allergen Reactions    Lipitor [Atorvastatin]     Myalgias   Prozac [Fluoxetine Hcl]    Rosuvastatin     myalgias   Simvastatin     Myalgias    Physical exam:  Today's Vitals   06/24/22 0936  BP: (!) 144/91  Pulse: 80  Weight: 231 lb (104.8 kg)  Height: '5\' 11"'$  (1.803 m)   Body mass index is 32.22 kg/m.   Wt Readings from Last 3 Encounters:  06/24/22 231 lb (104.8 kg)  05/01/22 225 lb 12.8 oz (102.4 kg)  12/25/21 221 lb 9.6 oz (100.5 kg)     Ht Readings from Last 3 Encounters:  06/24/22 '5\' 11"'$  (1.803 m)  05/01/22 '5\' 10"'$  (1.778 m)  08/14/21 '5\' 11"'$  (1.803 m)      General: The patient is awake, alert and appears not in acute distress. The patient is well groomed. Head: Normocephalic, atraumatic. Neck is supple. Mallampati 2- but with tonsils that kiss the uvula. Small oral opening.    neck circumference:18 inches . Nasal airflow  patent.  Retrognathia is  seen.  Dental status:  Cardiovascular:  Regular  rate and cardiac rhythm by pulse,  without distended neck veins. Respiratory: Lungs are clear to auscultation.  Skin:  Without evidence of ankle edema, or rash. Trunk: The patient's posture is erect.   Neurologic exam : The patient is awake and alert, oriented to place and time.   Memory subjective described as intact.  Attention span & concentration ability appears limited.  Speech is fluent, pressured,   without  dysarthria, dysphonia or aphasia.  Mood and affect are appropriate.   Cranial nerves: no loss of smell or taste reported  Pupils are equal and briskly reactive to light.  Facial sensation intact to fine touch. Facial motor strength is symmetric and tongue and uvula move midline.  Neck ROM : rotation, tilt and flexion extension were normal for age and shoulder shrug was symmetrical.    Motor exam:  Symmetric bulk, tone and ROM.   Normal tone without cog wheeling, symmetric grip strength .   Sensory:  Fine touch and vibration were normal.  Proprioception tested in the upper  extremities was normal.       After spending a total time of 30 minutes face to face and additional time for physical and neurologic examination, review of laboratory studies,  personal review of imaging studies, reports and results of other testing and review of referral information / records as far as provided in visit, I have established the following assessments:    My Plan is to proceed with:   1) patient has given up on CPAP- it made him get less sleep. He tried many masks. Sleeps restless, can't stay supine because of back pain has major air leaks. He owns his machine .  2) Plan A alternatives to PAP to discuss:  we settled on a referral for a dental device, hopefully reducing the AHI by 50%, I don't expect a reduction to under AHI of 10/h.  3) Plan B Consider ENT referral , depending on outcome- he still does not want to have his tonsils removed. .    I would like to thank Unk Pinto, MD and Unk Pinto, [place],  [place] [address] for allowing me to meet with and to take care of this pleasant patient.   I plan to follow up either personally or through our NP within 6 months to look at dental device  effect by HST - unless this was done. .   CC: I will share my notes with PCP .  Electronically signed by: Larey Seat, MD 06/24/2022 10:22 AM  [facility] Board certified by The [employer] of Sleep Medicine and Diplomate of the [employer] of Sleep Medicine. Board certified In Neurology through the [place], Fellow of the [employer] of Neurology. Medical Director of [facility].

## 2022-07-17 ENCOUNTER — Ambulatory Visit: Payer: BC Managed Care – PPO | Admitting: Neurology

## 2022-07-17 ENCOUNTER — Encounter: Payer: Self-pay | Admitting: Internal Medicine

## 2022-07-22 ENCOUNTER — Other Ambulatory Visit: Payer: Self-pay

## 2022-07-22 DIAGNOSIS — K219 Gastro-esophageal reflux disease without esophagitis: Secondary | ICD-10-CM

## 2022-07-22 MED ORDER — OMEPRAZOLE 40 MG PO CPDR: DELAYED_RELEASE_CAPSULE | ORAL | 1 refills | Status: DC

## 2022-08-13 ENCOUNTER — Ambulatory Visit: Payer: BC Managed Care – PPO | Admitting: Nurse Practitioner

## 2022-08-13 NOTE — Telephone Encounter (Signed)
Dr Kae Heller office ([place]) requested sleep study results. Faxed to [phone]

## 2022-08-19 NOTE — Progress Notes (Unsigned)
FOLLOW UP 3 MONTH  Assessment and Plan:   Hypertension Well controlled with current medications - 1/2 tab lisinopril/hctz 20/'25mg'$  QD Monitor blood pressure at home; patient to call if consistently greater than 130/80 Continue DASH diet.   Reminder to go to the ER if any CP, SOB, nausea, dizziness, severe HA, changes vision/speech, left arm numbness and tingling and jaw pain. Check CBC  Cholesterol Currently taking Rosuvastatin 20 mg QD, has had some leg cramps but uncertain if related to meds- will monitor Continue low cholesterol diet and exercise.  Check lipid panel  Other abnormal glucose Recent A1Cs well controlled , wants A1c checked as he is concerned cholesterol medication(Zetia) previously raised his blood sugar- currently on Rosuvastatin Continue diet and exercise.  Perform daily foot/skin check, notify office of any concerning changes.  Check A1C; check CMP  Obesity Long discussion about weight loss, diet, and exercise Recommended diet heavy in fruits and veggies and low in animal meats, cheeses, and dairy products, appropriate calorie intake Discussed ideal weight for height and  weight goal (200 lb) Will follow up in 3 months Check thyroid  Vitamin D Def Near goal at last visit; continue supplementation to maintain goal of 60-100 Defer Vit D level  Anxiety/Insomnia Well managed by current regimen; continue medications Using Alprazolam '1mg'$ , half tablet at night for sleep. Stress management techniques discussed, increase water, good sleep hygiene discussed, increase exercise, and increase veggies.   Medication Management  Continued       Continue diet and meds as discussed. Further disposition pending results of labs. Discussed med's effects and SE's.   Over 30 minutes of face to face interview, exam, counseling, chart review, and critical decision making was performed.   Future Appointments  Date Time Provider [place]  08/20/2022 11:00 AM  Alycia Rossetti, NP [facility] None  11/12/2022 10:30 AM Unk Pinto, MD [facility] None  01/01/2023  1:30 PM Debbora Presto, NP [facility] None  05/06/2023 11:00 AM Unk Pinto, MD [facility] None    ----------------------------------------------------------------------------------------------------------------------  HPI 57 y.o. male  presents for 3 month follow up on hypertension, cholesterol, glucose management, obesity and vitamin D deficiency.  Patient was evaluated  by Dr Brett Fairy and sleep study showed only snoring w/o apnea and he declined f/u or referral for an oral appliance.  He is noticing some lower leg cramping, uncertain if related to rosuvastatin.  Insomina he is using alprazolam 1/2 tablet which is helping.  He has some lower back pain, has seen [facility], has MRI with "arthritis"; has been taking tylenol and meloxicam, has had some hip spasms. He has been having some pain that will go down his right leg that is sharp shooting.   BMI is There is no height or weight on file to calculate BMI., he has been working on diet and exercise, he is very active in his yard/garden, also physical job. Eating lots of veggies.  Wt Readings from Last 3 Encounters:  06/24/22 231 lb (104.8 kg)  05/01/22 225 lb 12.8 oz (102.4 kg)  12/25/21 221 lb 9.6 oz (100.5 kg)   His blood pressure has been controlled at home (115-130s/70-80s), today their BP is    He does workout. He denies chest pain, shortness of breath, dizziness.   He is on cholesterol medication Rosuvastatin 20 mg(was on zetia 10 mg daily and livalo 2 mg daily but reports had severe fatigue, also some leg pain and stopped taking). Hx of statin myalgias with other statins.   His cholesterol is  not at goal, remains severely elevated. He had cardio IQ panel in 05/2017 which showed LDL particle 2305, LDL small 529, apolipoprotein B of 146. Family history of hyperlipidemia but no MI/CVA. The cholesterol last visit was:   Lab  Results  Component Value Date   CHOL 250 (H) 05/01/2022   HDL 57 05/01/2022   [place]  05/01/2022     Comment:     . LDL cholesterol not calculated. Triglyceride levels greater than 400 mg/dL invalidate calculated LDL results. . Reference range: <100 . Desirable range <100 mg/dL for primary prevention;   <70 mg/dL for patients with CHD or diabetic patients  with > or = 2 CHD risk factors. Marland Kitchen LDL-C is now calculated using the Martin-Hopkins  calculation, which is a validated novel method providing  better accuracy than the Friedewald equation in the  estimation of LDL-C.  Cresenciano Genre et al. Annamaria Helling. 3086;578(46): 2061-2068  (http://education.[employer].com/faq/FAQ164)    TRIG 435 (H) 05/01/2022   CHOLHDL 4.4 05/01/2022    He has been working on diet and exercise for glucose management, and denies foot ulcerations, increased appetite, nausea, paresthesia of the feet, polydipsia, polyuria, visual disturbances, vomiting and weight loss. Last A1C in the office was:  Lab Results  Component Value Date   HGBA1C 5.3 05/01/2022    Patient is on Vitamin D supplement.   Lab Results  Component Value Date   VD25OH 44 05/01/2022       Current Medications:  Current Outpatient Medications on File Prior to Visit  Medication Sig   ALPRAZolam (XANAX) 1 MG tablet Take  1/2 to 1 tablet  2 to 3 x / day  ONLY  if needed for Anxiety Attack & please try to limit to 5 days /week to avoid Addiction & Dementia                                                              /                                     TAKE                    BY                   MOUTH (Patient not taking: Reported on 06/24/2022)   celecoxib (CELEBREX) 100 MG capsule Take 1 capsule (100 mg total) by mouth 2 (two) times daily. (Patient taking differently: Take 100 mg by mouth as needed for moderate pain.)   cholecalciferol (VITAMIN D) 1000 UNITS tablet Take 5,000 Units by mouth daily.    co-enzyme Q-10 30 MG capsule Take 30 mg by  mouth 3 (three) times daily.   famotidine (PEPCID) 20 MG tablet Take  1 tablet  2 x /day to Prevent Heartburn & Indigestion                                         /                         TAKE  BY                             MOUTH   lidocaine-prilocaine (EMLA) cream APPLY TOPICALLY AS NEEDED   lisinopril-hydrochlorothiazide (ZESTORETIC) 20-25 MG tablet Take  1/2 tablet  Daily  for BP                                                                 /                                   TAKE                              BY                               MOUTH                                  ONCE DAILY   meloxicam (MOBIC) 15 MG tablet Take 15 mg by mouth daily.   montelukast (SINGULAIR) 10 MG tablet Take 1 tablet Daily for Allergies & Asthma / Patient knows to take by mouth   Multiple Vitamins-Minerals (MULTIVITAMIN PO) Take by mouth.   mupirocin ointment (BACTROBAN) 2 % Apply 1 application. topically 2 (two) times daily.   nitroGLYCERIN (NITROGLYN) 2 % ointment Apply   1/2 "  to Hemorrhoid  every 6 hours  as needed   Omega-3 Fatty Acids (FISH OIL PO) Take by mouth.   omeprazole (PRILOSEC) 40 MG capsule TAKE 1 CAPSULE BY MOUTH DAILY TO PREVENT HEARTBURN AND INDIGESTION   PROCTO-MED HC 2.5 % rectal cream APPLY CREAM  RECTALLY 4 TIMES DAILY   rosuvastatin (CRESTOR) 20 MG tablet Take 1 tablet Daily for Cholesterol   No current facility-administered medications on file prior to visit.     Allergies:  Allergies  Allergen Reactions   Lipitor [Atorvastatin]     Myalgias   Prozac [Fluoxetine Hcl]    Rosuvastatin     myalgias   Simvastatin     Myalgias     Medical History:  Past Medical History:  Diagnosis Date   Allergy    GERD (gastroesophageal reflux disease)    Hyperlipidemia    Hypertension    IBS (irritable bowel syndrome)    Traumatic pneumothorax 12/04/2015   Vitamin D deficiency    Family history- Reviewed and unchanged Social history- Reviewed and  unchanged   Review of Systems  Constitutional:  Negative for chills, fever and weight loss.  HENT:  Negative for congestion and hearing loss.   Eyes:  Negative for blurred vision and double vision.  Respiratory:  Negative for cough and shortness of breath.   Cardiovascular:  Negative for chest pain, palpitations, orthopnea and leg swelling.  Gastrointestinal:  Negative for abdominal pain, constipation, diarrhea, heartburn, nausea and vomiting.  Musculoskeletal:  Positive for back pain (low back , occ shooting down right leg) and myalgias (occ muscle cramp lower legs). Negative for falls  and joint pain.  Skin:  Negative for rash.  Neurological:  Negative for dizziness, tingling, tremors, loss of consciousness and headaches.  Psychiatric/Behavioral:  Negative for depression, memory loss and suicidal ideas.        Physical Exam: There were no vitals taken for this visit. Wt Readings from Last 3 Encounters:  06/24/22 231 lb (104.8 kg)  05/01/22 225 lb 12.8 oz (102.4 kg)  12/25/21 221 lb 9.6 oz (100.5 kg)   General Appearance: Well nourished, in no apparent distress. Eyes: PERRLA, EOMs, conjunctiva no swelling or erythema Sinuses: No Frontal/maxillary tenderness ENT/Mouth: Ext aud canals clear, TMs without erythema, bulging. No erythema, swelling, or exudate on post pharynx.  Tonsils not swollen or erythematous. Hearing normal.  Neck: Supple, thyroid normal.  Respiratory: Respiratory effort normal, BS equal bilaterally without rales, rhonchi, wheezing or stridor.  Cardio: RRR with no MRGs. Brisk peripheral pulses without edema.  Abdomen: Soft, + BS.  Non tender, no guarding, rebound, hernias, masses. Lymphatics: Non tender without lymphadenopathy.  Musculoskeletal: Full ROM, 5/5 strength, Normal gait. Skin: Warm, dry without rashes, lesions, ecchymosis.  Neuro: Cranial nerves intact. No cerebellar symptoms.  Psych: Awake and oriented X 3, normal affect, Insight and Judgment  appropriate.    Magda Bernheim ANP-C  [facility]  08/19/2022

## 2022-08-20 ENCOUNTER — Ambulatory Visit (INDEPENDENT_AMBULATORY_CARE_PROVIDER_SITE_OTHER): Payer: BC Managed Care – PPO | Admitting: Nurse Practitioner

## 2022-08-20 ENCOUNTER — Encounter: Payer: Self-pay | Admitting: Nurse Practitioner

## 2022-08-20 VITALS — BP 112/72 | HR 79 | Temp 97.7°F | Ht 71.0 in | Wt 230.0 lb

## 2022-08-20 DIAGNOSIS — Z79899 Other long term (current) drug therapy: Secondary | ICD-10-CM

## 2022-08-20 DIAGNOSIS — I10 Essential (primary) hypertension: Secondary | ICD-10-CM | POA: Diagnosis not present

## 2022-08-20 DIAGNOSIS — E782 Mixed hyperlipidemia: Secondary | ICD-10-CM | POA: Diagnosis not present

## 2022-08-20 DIAGNOSIS — E66811 Obesity, class 1: Secondary | ICD-10-CM

## 2022-08-20 DIAGNOSIS — R7309 Other abnormal glucose: Secondary | ICD-10-CM

## 2022-08-20 DIAGNOSIS — E559 Vitamin D deficiency, unspecified: Secondary | ICD-10-CM | POA: Diagnosis not present

## 2022-08-20 DIAGNOSIS — G4700 Insomnia, unspecified: Secondary | ICD-10-CM

## 2022-08-20 DIAGNOSIS — E669 Obesity, unspecified: Secondary | ICD-10-CM

## 2022-08-20 DIAGNOSIS — G4733 Obstructive sleep apnea (adult) (pediatric): Secondary | ICD-10-CM

## 2022-08-20 DIAGNOSIS — F419 Anxiety disorder, unspecified: Secondary | ICD-10-CM

## 2022-08-20 NOTE — Patient Instructions (Signed)

## 2022-08-21 LAB — COMPLETE METABOLIC PANEL WITH GFR
AG Ratio: 1.9 (calc) (ref 1.0–2.5)
ALT: 31 U/L (ref 9–46)
AST: 25 U/L (ref 10–35)
Albumin: 4.9 g/dL (ref 3.6–5.1)
Alkaline phosphatase (APISO): 64 U/L (ref 35–144)
BUN: 13 mg/dL (ref 7–25)
CO2: 27 mmol/L (ref 20–32)
Calcium: 9.9 mg/dL (ref 8.6–10.3)
Chloride: 99 mmol/L (ref 98–110)
Creat: 0.87 mg/dL (ref 0.70–1.30)
Globulin: 2.6 g/dL (calc) (ref 1.9–3.7)
Glucose, Bld: 91 mg/dL (ref 65–99)
Potassium: 4 mmol/L (ref 3.5–5.3)
Sodium: 137 mmol/L (ref 135–146)
Total Bilirubin: 0.8 mg/dL (ref 0.2–1.2)
Total Protein: 7.5 g/dL (ref 6.1–8.1)
eGFR: 100 mL/min/{1.73_m2} (ref >=60)

## 2022-08-21 LAB — CBC WITH DIFFERENTIAL/PLATELET
Absolute Monocytes: 469 cells/uL (ref 200–950)
Basophils Absolute: 67 cells/uL (ref 0–200)
Basophils Relative: 1 %
Eosinophils Absolute: 107 cells/uL (ref 15–500)
Eosinophils Relative: 1.6 %
HCT: 44.7 % (ref 38.5–50.0)
Hemoglobin: 15.2 g/dL (ref 13.2–17.1)
Lymphs Abs: 1883 cells/uL (ref 850–3900)
MCH: 31.8 pg (ref 27.0–33.0)
MCHC: 34 g/dL (ref 32.0–36.0)
MCV: 93.5 fL (ref 80.0–100.0)
MPV: 11.1 fL (ref 7.5–12.5)
Monocytes Relative: 7 %
Neutro Abs: 4174 cells/uL (ref 1500–7800)
Neutrophils Relative %: 62.3 %
Platelets: 296 10*3/uL (ref 140–400)
RBC: 4.78 10*6/uL (ref 4.20–5.80)
RDW: 11.3 % (ref 11.0–15.0)
Total Lymphocyte: 28.1 %
WBC: 6.7 10*3/uL (ref 3.8–10.8)

## 2022-08-21 LAB — LIPID PANEL
Cholesterol: 253 mg/dL — ABNORMAL HIGH (ref <200)
HDL: 63 mg/dL (ref >=40)
LDL Cholesterol (Calc): 146 mg/dL (calc) — ABNORMAL HIGH
Non-HDL Cholesterol (Calc): 190 mg/dL (calc) — ABNORMAL HIGH (ref <130)
Total CHOL/HDL Ratio: 4 (calc) (ref <5.0)
Triglycerides: 271 mg/dL — ABNORMAL HIGH (ref <150)

## 2022-08-21 LAB — MAGNESIUM: Magnesium: 2 mg/dL (ref 1.5–2.5)

## 2022-09-11 ENCOUNTER — Encounter: Payer: Self-pay | Admitting: Neurology

## 2022-09-11 ENCOUNTER — Telehealth: Payer: Self-pay | Admitting: Nurse Practitioner

## 2022-09-11 ENCOUNTER — Telehealth: Payer: Self-pay | Admitting: Neurology

## 2022-09-11 NOTE — Telephone Encounter (Signed)
A letter was completed and uploaded to mychart. Will send a message letting the patient know.

## 2022-09-11 NOTE — Telephone Encounter (Signed)
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 He failed due to not being able to get 4 hrs on the CPAP machine, he spoke to [facility] and he says that they told him that they're hands are tied. They can not sign the form saying he has completed 4 hrs every night when the machine is saying he hasn't. He is wanting to know what if anything can he do to get cleared for this DOT Physical.

## 2022-09-11 NOTE — Telephone Encounter (Signed)
This is completely dependent on his CPAP machine use which is through [facility].  There is nothing our office can do to override his need to use the CPAP for his OSA nightly

## 2022-09-11 NOTE — Telephone Encounter (Signed)
Pt wife is asking for a letter stating pt is ok to drive while waiting for another sleep study, this has to do with DOT physical, please call wife to discuss further.  Wife added that the deadline is by tomorrow afternoon.

## 2022-09-16 ENCOUNTER — Telehealth: Payer: Self-pay | Admitting: Nurse Practitioner

## 2022-09-16 NOTE — Telephone Encounter (Signed)
Pt is requesting a refill on Xanax to go to [facility] on [address]

## 2022-09-17 ENCOUNTER — Ambulatory Visit: Payer: BC Managed Care – PPO | Admitting: Neurology

## 2022-09-17 ENCOUNTER — Other Ambulatory Visit: Payer: Self-pay | Admitting: Nurse Practitioner

## 2022-09-17 DIAGNOSIS — F419 Anxiety disorder, unspecified: Secondary | ICD-10-CM

## 2022-09-17 MED ORDER — ALPRAZOLAM 1 MG PO TABS: ORAL_TABLET | ORAL | 0 refills | Status: DC

## 2022-09-17 NOTE — Telephone Encounter (Signed)
Done

## 2022-11-12 ENCOUNTER — Ambulatory Visit: Payer: BC Managed Care – PPO | Admitting: Internal Medicine

## 2022-11-19 ENCOUNTER — Encounter: Payer: Self-pay | Admitting: Internal Medicine

## 2022-11-19 NOTE — Progress Notes (Unsigned)
Future Appointments  Date Time Provider Department  11/20/2022  9:30 AM Lucky Cowboy, MD [facility]  01/01/2023  1:30 PM Shawnie Dapper, NP [facility]  05/06/2023 11:00 AM Lucky Cowboy, MD [facility]    History of Present Illness:       This very nice 57 y.o. [name] presents for 6 month follow up with HTN, HLD, Pre-Diabetes and Vitamin D Deficiency.         Today patient is c/o chronic Low back pain with Rt sciatica > 1 year- Had Lumbar spine imaging in May 2023  showing mild nonspecific changes.  Now c/o worsening pains over the last 6 months & espedially worse over the last 2 month frequently awakening him at night , also making it difficult to stand from a sitting position and pain radiating to bilat buttocks with ambulation.         Patient is treated for HTN  since  2005 & BP has been controlled at home. Today's BP is at goal - 138/78. Patient has had no complaints of any cardiac type chest pain, palpitations, dyspnea / orthopnea / PND, dizziness, claudication, or dependent edema.        Hyperlipidemia is not controlled with diet & meds ( ? Compliance ) . Patient denies myalgias or other med SE's. Last Lipids were  not at goal :  Lab Results  Component Value Date   CHOL 253 (H) 08/20/2022   HDL 63 08/20/2022   LDLCALC 146 (H) 08/20/2022   TRIG 271 (H) 08/20/2022   CHOLHDL 4.0 08/20/2022     Also, the patient his followed expectantly for glucose intolerance and has had no symptoms of reactive hypoglycemia, diabetic polys, paresthesias or visual blurring.  Last A1c was Normal & at goal :  Lab Results  Component Value Date   HGBA1C 5.3 05/01/2022                                                        Further, the patient also has history of Vitamin D Deficiency ("34" /2016)  and supplements vitamin D . Last vitamin D was low  ( goal = 70-100) :  Lab Results  Component Value Date   VD25OH 44 05/01/2022     Current Outpatient Medications on File Prior to Visit   Medication Sig   ALPRAZolam (XANAX) 1 MG tablet Take  1/2 to 1 tablet  2 to 3 x / day  ONLY  if needed    celecoxib (CELEBREX) 100 MG capsule Take 1 capsule 2  times daily   VITAMIN D 5,000 u Take daily.    co-enzyme Q-10 30 MG capsule Take  3 times daily.   famotidine 20 MG tablet Take  1 tablet  2 x /day    lidocaine-prilocaine (EMLA) cream APPLY TOPICALLY AS NEEDED   lisinopril-hctz  20-25 MG tablet Take  1/2 tablet  Daily     MILK THISTLE  Take daily   montelukast 10 MG tablet Take 1 tablet Daily    Multiple Vitamins-Minerals  Take  daily   Multiple Vitamins-Minerals (ZINC PO) Take daily   mupirocin ointment (BACTROBAN) 2 % Apply  2  times daily.   nitroGLYCERIN (NITROGLYN) 2 % ointment Apply   1/2 "  to Hemorrhoid  every 6 hours  as needed   Omega-3  FISH  OIL  Take daily   omeprazole 40 MG capsule TAKE 1 CAPSULE DAILY    PROCTO-MED HC 2.5 % rectal cream APPLY CREAM  RECTALLY 4 TIMES DAILY   rosuvastatin 20 MG tablet Take 1 tablet Daily     Allergies  Allergen Reactions   Lipitor [Atorvastatin]     Myalgias   Prozac [Fluoxetine Hcl]    Rosuvastatin     myalgias   Simvastatin     Myalgias     PMHx:   Past Medical History:  Diagnosis Date   Allergy    GERD (gastroesophageal reflux disease)    Hyperlipidemia    Hypertension    IBS (irritable bowel syndrome)    Traumatic pneumothorax 12/04/2015   Vitamin D deficiency      Immunization History  Administered Date(s) Administered   PPD Test 06/29/2018, 10/28/2019, 01/24/2021   Pneumococcal - 23 01/06/2012   Td 06/10/2006   Tdap 03/27/2016     Past Surgical History:  Procedure Laterality Date   CHEST TUBE INSERTION Right 05-15-02   thoracostomy   ESOPHAGOGASTRODUODENOSCOPY (EGD) WITH PROPOFOL N/A 12/15/2020   Procedure: ESOPHAGOGASTRODUODENOSCOPY (EGD) WITH PROPOFOL;  Surgeon: Earline Mayotte, MD;  Location: [facility] ENDOSCOPY;  Service: Endoscopy;  Laterality: N/A;  with possible dilation   TOE SURGERY  2008    right big toe     FHx:    Reviewed / unchanged   SHx:    Reviewed / unchanged    Systems Review:  Constitutional: Denies fever, chills, wt changes, headaches, insomnia, fatigue, night sweats, change in appetite. Eyes: Denies redness, blurred vision, diplopia, discharge, itchy, watery eyes.  ENT: Denies discharge, congestion, post nasal drip, epistaxis, sore throat, earache, hearing loss, dental pain, tinnitus, vertigo, sinus pain, snoring.  CV: Denies chest pain, palpitations, irregular heartbeat, syncope, dyspnea, diaphoresis, orthopnea, PND, claudication or edema. Respiratory: denies cough, dyspnea, DOE, pleurisy, hoarseness, laryngitis, wheezing.  Gastrointestinal: Denies dysphagia, odynophagia, heartburn, reflux, water brash, abdominal pain or cramps, nausea, vomiting, bloating, diarrhea, constipation, hematemesis, melena, hematochezia  or hemorrhoids. Genitourinary: Denies dysuria, frequency, urgency, nocturia, hesitancy, discharge, hematuria or flank pain. Musculoskeletal: Denies arthralgias, myalgias, stiffness, jt. swelling, pain, limping or strain/sprain.  Skin: Denies pruritus, rash, hives, warts, acne, eczema or change in skin lesion(s). Neuro: No weakness, tremor, incoordination, spasms, paresthesia or pain. Psychiatric: Denies confusion, memory loss or sensory loss. Endo: Denies change in weight, skin or hair change.  Heme/Lymph: No excessive bleeding, bruising or enlarged lymph nodes.   Physical Exam  BP 138/78   Pulse 77   Temp 97.9 F (36.6 C)   Resp 16   Ht  (1.778 m)   Wt 228 lb 3.2 oz (103.5 kg)   SpO2 99%   BMI 32.74 kg/m   Appears  well nourished, well groomed  and in no distress.  Eyes: PERRLA, EOMs, conjunctiva no swelling or erythema. Sinuses: No frontal/maxillary tenderness ENT/Mouth: EAC's clear, TM's nl w/o erythema, bulging. Nares clear w/o erythema, swelling, exudates. Oropharynx clear without erythema or exudates. Oral hygiene is good.  Tongue normal, non obstructing. Hearing intact.  Neck: Supple. Thyroid not palpable. Car 2+/2+ without bruits, nodes or JVD. Chest: Respirations nl with BS clear & equal w/o rales, rhonchi, wheezing or stridor.  Cor: Heart sounds normal w/ regular rate and rhythm without sig. murmurs, gallops, clicks or rubs. Peripheral pulses normal and equal  without edema.  Abdomen: Soft & bowel sounds normal. Non-tender w/o guarding, rebound, hernias, masses or organomegaly.  Lymphatics: Unremarkable.  Musculoskeletal: Full ROM all  peripheral extremities, joint stability, 5/5 strength and normal gait.  Skin: Warm, dry without exposed rashes, lesions or ecchymosis apparent.  Neuro: Cranial nerves intact, reflexes equal bilaterally. Sensory-motor testing grossly intact. Tendon reflexes flat /guarded in LEs. Sl (+) SLR bilat.  Pysch: Alert & oriented x 3.  Insight and judgement nl & appropriate. No ideations.   Assessment and Plan:  1. Essential hypertension  - Continue medication, monitor blood pressure at home.  - Continue DASH diet.  Reminder to go to the ER if any CP,  SOB, nausea, dizziness, severe HA, changes vision/speech.   - CBC with Differential/Platelet - COMPLETE METABOLIC PANEL WITH GFR - Magnesium - TSH  2. Hyperlipidemia, mixed  - Continue diet/meds, exercise,& lifestyle modifications.  - Continue monitor periodic cholesterol/liver & renal functions    - Lipid panel - TSH  3. Abnormal glucose  - Continue diet, exercise  - Lifestyle modifications.  - Monitor appropriate labs   - Hemoglobin A1c - Insulin, random  4. Vitamin D deficiency  - Continue supplementation   - VITAMIN D 25 Hydroxy  5. Gastroesophageal reflux disease without esophagitis  - COMPLETE METABOLIC PANEL WITH GFR  6. Chronic bilateral low back pain with bilateral sciatica  - dexamethasone (DECADRON) 4 MG tablet;  Take 1 tab 3 x day - 3 days, then 2 x day - 3 days, then 1 tab daily   Dispense: 20  tablet; Refill: 0  - cyclobenzaprine (FLEXERIL) 10 MG tablet;  Take 1/2 to 1 tablet 3 x /day as needed for Muscle Spasm   Dispense: 90 tablet; Refill: 0  - MR Lumbar Spine Wo Contrast; Future  7. Medication management  - CBC with Differential/Platelet - COMPLETE METABOLIC PANEL WITH GFR - Magnesium - Lipid panel - TSH - Hemoglobin A1c - Insulin, random - VITAMIN D 25 Hydroxy          Discussed  regular exercise, BP monitoring, weight control to achieve/maintain BMI less than 25 and discussed med and SE's. Recommended labs to assess /monitor clinical status .  I discussed the assessment and treatment plan with the patient. The patient was provided an opportunity to ask questions and all were answered. The patient agreed with the plan and demonstrated an understanding of the instructions.  I provided over 30 minutes of exam, counseling, chart review and  complex critical decision making.        The patient was advised to call back or seek an in-person evaluation if the symptoms worsen or if the condition fails to improve as anticipated.   Marinus Maw, MD

## 2022-11-19 NOTE — Patient Instructions (Signed)

## 2022-11-20 ENCOUNTER — Ambulatory Visit: Payer: BC Managed Care – PPO | Admitting: Internal Medicine

## 2022-11-20 ENCOUNTER — Encounter: Payer: Self-pay | Admitting: Internal Medicine

## 2022-11-20 VITALS — BP 138/78 | HR 77 | Temp 97.9°F | Resp 16 | Ht 70.0 in | Wt 228.2 lb

## 2022-11-20 DIAGNOSIS — R7309 Other abnormal glucose: Secondary | ICD-10-CM | POA: Diagnosis not present

## 2022-11-20 DIAGNOSIS — G8929 Other chronic pain: Secondary | ICD-10-CM

## 2022-11-20 DIAGNOSIS — Z79899 Other long term (current) drug therapy: Secondary | ICD-10-CM | POA: Diagnosis not present

## 2022-11-20 DIAGNOSIS — I10 Essential (primary) hypertension: Secondary | ICD-10-CM | POA: Diagnosis not present

## 2022-11-20 DIAGNOSIS — K219 Gastro-esophageal reflux disease without esophagitis: Secondary | ICD-10-CM

## 2022-11-20 DIAGNOSIS — M5441 Lumbago with sciatica, right side: Secondary | ICD-10-CM

## 2022-11-20 DIAGNOSIS — E559 Vitamin D deficiency, unspecified: Secondary | ICD-10-CM

## 2022-11-20 DIAGNOSIS — M5442 Lumbago with sciatica, left side: Secondary | ICD-10-CM

## 2022-11-20 DIAGNOSIS — E782 Mixed hyperlipidemia: Secondary | ICD-10-CM | POA: Diagnosis not present

## 2022-11-20 LAB — CBC WITH DIFFERENTIAL/PLATELET
Basophils Absolute: 58 cells/uL (ref 0–200)
Basophils Relative: 1 %
Lymphs Abs: 1775 cells/uL (ref 850–3900)
MCHC: 33.5 g/dL (ref 32.0–36.0)
MCV: 93.9 fL (ref 80.0–100.0)
Neutro Abs: 3439 cells/uL (ref 1500–7800)
RBC: 4.74 10*6/uL (ref 4.20–5.80)
RDW: 11.8 % (ref 11.0–15.0)
Total Lymphocyte: 30.6 %

## 2022-11-20 MED ORDER — DEXAMETHASONE 4 MG PO TABS
ORAL_TABLET | ORAL | 0 refills | Status: DC
Start: 2022-11-20 — End: 2023-03-05

## 2022-11-20 MED ORDER — CYCLOBENZAPRINE HCL 10 MG PO TABS
ORAL_TABLET | ORAL | 0 refills | Status: DC
Start: 2022-11-20 — End: 2023-03-05

## 2022-11-21 LAB — VITAMIN D 25 HYDROXY (VIT D DEFICIENCY, FRACTURES): Vit D, 25-Hydroxy: 54 ng/mL (ref 30–100)

## 2022-11-21 LAB — HEMOGLOBIN A1C
Hgb A1c MFr Bld: 5.5 % of total Hgb (ref <5.7)
Mean Plasma Glucose: 111 mg/dL
eAG (mmol/L): 6.2 mmol/L

## 2022-11-21 LAB — COMPLETE METABOLIC PANEL WITH GFR
AG Ratio: 1.9 (calc) (ref 1.0–2.5)
ALT: 43 U/L (ref 9–46)
AST: 38 U/L — ABNORMAL HIGH (ref 10–35)
Albumin: 4.8 g/dL (ref 3.6–5.1)
Alkaline phosphatase (APISO): 61 U/L (ref 35–144)
BUN: 11 mg/dL (ref 7–25)
CO2: 26 mmol/L (ref 20–32)
Calcium: 9.7 mg/dL (ref 8.6–10.3)
Chloride: 99 mmol/L (ref 98–110)
Creat: 0.79 mg/dL (ref 0.70–1.30)
Globulin: 2.5 g/dL (calc) (ref 1.9–3.7)
Glucose, Bld: 104 mg/dL — ABNORMAL HIGH (ref 65–99)
Potassium: 4.6 mmol/L (ref 3.5–5.3)
Sodium: 136 mmol/L (ref 135–146)
Total Bilirubin: 0.7 mg/dL (ref 0.2–1.2)
Total Protein: 7.3 g/dL (ref 6.1–8.1)
eGFR: 103 mL/min/{1.73_m2} (ref >=60)

## 2022-11-21 LAB — CBC WITH DIFFERENTIAL/PLATELET
Absolute Monocytes: 458 cells/uL (ref 200–950)
Eosinophils Absolute: 70 cells/uL (ref 15–500)
Eosinophils Relative: 1.2 %
HCT: 44.5 % (ref 38.5–50.0)
Hemoglobin: 14.9 g/dL (ref 13.2–17.1)
MCH: 31.4 pg (ref 27.0–33.0)
MPV: 10.7 fL (ref 7.5–12.5)
Monocytes Relative: 7.9 %
Neutrophils Relative %: 59.3 %
Platelets: 331 10*3/uL (ref 140–400)
WBC: 5.8 10*3/uL (ref 3.8–10.8)

## 2022-11-21 LAB — LIPID PANEL
Cholesterol: 282 mg/dL — ABNORMAL HIGH (ref <200)
HDL: 56 mg/dL (ref >=40)
Non-HDL Cholesterol (Calc): 226 mg/dL (calc) — ABNORMAL HIGH (ref <130)
Total CHOL/HDL Ratio: 5 (calc) — ABNORMAL HIGH (ref <5.0)
Triglycerides: 502 mg/dL — ABNORMAL HIGH (ref <150)

## 2022-11-21 LAB — MAGNESIUM: Magnesium: 2 mg/dL (ref 1.5–2.5)

## 2022-11-21 LAB — INSULIN, RANDOM: Insulin: 12.4 u[IU]/mL

## 2022-11-21 LAB — TSH: TSH: 1.35 mIU/L (ref 0.40–4.50)

## 2022-11-21 NOTE — Progress Notes (Signed)
^<^<^<^<^<^<^<^<^<^<^<^<^<^<^<^<^<^<^<^<^<^<^<^<^<^<^<^<^<^<^<^<^<^<^<^<^ ^>^>^>^>^>^>^>^>^>^>^>>^>^>^>^>^>^>^>^>^>^>^>^>^>^>^>^>^>^>^>^>^>^>^>^>^ -Test results slightly outside the reference range are not unusual. If there is anything important, I will review this with you,  otherwise it is considered normal test values.  If you have further questions,  please do not hesitate to contact me at the office or via My Chart.  ^<^<^<^<^<^<^<^<^<^<^<^<^<^<^<^<^<^<^<^<^<^<^<^<^<^<^<^<^<^<^<^<^<^<^<^<^ ^>^>^>^>^>^>^>^>^>^>^>^>^>^>^>^>^>^>^>^>^>^>^>^>^>^>^>^>^>^>^>^>^>^>^>^>^  - Liver enzymes slightly elevated , So AVOID Alcohol ! ^>^>^>^>^>^>^>^>^>^>^>^>^>^>^>^>^>^>^>^>^>^>^>^>^>^>^>^>^>^>^>^>^>^>^>^>^ ^>^>^>^>^>^>^>^>^>^>^>^>^>^>^>^>^>^>^>^>^>^>^>^>^>^>^>^>^>^>^>^>^>^>^>^>^    - Total Chol = 282     is    very high risk   for Heart Attack /Stroke /Vascular Dementia     ( Ideal or Goal is less than 180 ! )  & - Bad /Dangerous LDL Chol =  146   - - >> Sitting on a time Bomb !     ( Ideal or Goal is less than 70 ! )    - The cause is Bad Diet !  - Please call office or send me a MyChart message                                                     - if you are taking your cholesterol medicine &   if you are - how are you taking it ! & let me know   - Read or listen to   Dr Gerri Spore 's book    " How Not to Die ! "    - Recommend a stricter plant based low cholesterol diet   - Cholesterol only comes from animal sources                                                                 - ie. meat, dairy, egg yolks  - Eat all the vegetables you want.  - Avoid Meat, Avoid Meat , Avoid Meat  ! ! !                                                  -especially red meat - Beef AND Pork  - Avoid cheese & dairy - milk & ice cream.   - Cheese is the most concentrated form of trans-fats which                                                    is the worst thing to  clog up our arteries.   - Veggie cheese is OK which can be found in                                              the fresh produce section at  [employer] or [employer] or [employer] ^>^>^>^>^>^>^>^>^>^>^>^>^>^>^>^>^>^>^>^>^>^>^>^>^>^>^>^>^>^>^>^>^>^>^>^>^ ^>^>^>^>^>^>^>^>^>^>^>^>^>^>^>^>^>^>^>^>^>^>^>^>^>^>^>^>^>^>^>^>^>^>^>^>^  - Also Triglycerides (  502    ) or fats in blood are    also    DANGEROUSLY    too high                 (   Ideal or  Goal is less than 150  !  )    - Recommend avoid fried & greasy foods,  sweets / candy,   - Avoid white rice  (brown or wild rice or Quinoa is OK),   - Avoid white potatoes  (sweet potatoes are OK)   - Avoid anything made from white flour  - bagels, doughnuts, rolls, buns, biscuits, white and   wheat breads, pizza crust and traditional  pasta made of white flour & egg white  - (vegetarian pasta or spinach or wheat pasta is OK).    - Multi-grain bread is OK - like multi-grain flat bread or sandwich thins.   - Avoid alcohol in excess.   - Exercise is also important. ^>^>^>^>^>^>^>^>^>^>^>^>^>^>^>^>^>^>^>^>^>^>^>^>^>^>^>^>^>^>^>^>^>^>^>^>^ ^>^>^>^>^>^>^>^>^>^>^>^>^>^>^>^>^>^>^>^>^>^>^>^>^>^>^>^>^>^>^>^>^>^>^>^>^  - A1c - Normal - No Diabetes  - Great !  ^>^>^>^>^>^>^>^>^>^>^>^>^>^>^>^>^>^>^>^>^>^>^>^>^>^>^>^>^>^>^>^>^>^>^>^>^  -  Vitamin D = 54 - slightly LOW   - Recommend Increase your Vitamin D 5,000 unit capsules                                                                         to take 2 capsules = 10,000 units  /day  ! ^>^>^>^>^>^>^>^>^>^>^>^>^>^>^>^>^>^>^>^>^>^>^>^>^>^>^>^>^>^>^>^>^>^>^>^>^ ^>^>^>^>^>^>^>^>^>^>^>^>^>^>^>^>^>^>^>^>^>^>^>^>^>^>^>^>^>^>^>^>^>^>^>^>^  -  All Else - CBC - Kidneys - Electrolytes - Liver - Magnesium & Thyroid    - all  Normal /  OK  ^>^>^>^>^>^>^>^>^>^>^>^>^>^>^>^>^>^>^>^>^>^>^>^>^>^>^>^>^>^>^>^>^>^>^>^>^ ^>^>^>^>^>^>^>^>^>^>^>^>^>^>^>^>^>^>^>^>^>^>^>^>^>^>^>^>^>^>^>^>^>^>^>^>^             -

## 2022-11-25 ENCOUNTER — Other Ambulatory Visit: Payer: Self-pay | Admitting: Nurse Practitioner

## 2022-11-25 ENCOUNTER — Other Ambulatory Visit: Payer: Self-pay | Admitting: Internal Medicine

## 2022-11-25 DIAGNOSIS — M5440 Lumbago with sciatica, unspecified side: Secondary | ICD-10-CM

## 2022-11-25 DIAGNOSIS — Z1211 Encounter for screening for malignant neoplasm of colon: Secondary | ICD-10-CM

## 2022-11-29 ENCOUNTER — Ambulatory Visit (HOSPITAL_BASED_OUTPATIENT_CLINIC_OR_DEPARTMENT_OTHER): Payer: BC Managed Care – PPO

## 2022-12-10 ENCOUNTER — Ambulatory Visit (INDEPENDENT_AMBULATORY_CARE_PROVIDER_SITE_OTHER): Payer: BC Managed Care – PPO

## 2022-12-10 DIAGNOSIS — M5442 Lumbago with sciatica, left side: Secondary | ICD-10-CM | POA: Diagnosis not present

## 2022-12-10 DIAGNOSIS — M5441 Lumbago with sciatica, right side: Secondary | ICD-10-CM | POA: Diagnosis not present

## 2022-12-10 DIAGNOSIS — G8929 Other chronic pain: Secondary | ICD-10-CM | POA: Diagnosis not present

## 2022-12-15 ENCOUNTER — Other Ambulatory Visit: Payer: Self-pay | Admitting: Internal Medicine

## 2022-12-15 DIAGNOSIS — M48062 Spinal stenosis, lumbar region with neurogenic claudication: Secondary | ICD-10-CM

## 2022-12-15 NOTE — Progress Notes (Signed)
^<^<^<^<^<^<^<^<^<^<^<^<^<^<^<^<^<^<^<^<^<^<^<^<^<^<^<^<^<^<^<^<^<^<^<^<^ ^>^>^>^>^>^>^>^>^>^>^>>^>^>^>^>^>^>^>^>^>^>^>^>^>^>^>^>^>^>^>^>^>^>^>^>^>  -   MRI shows moderate Lumbar Spinal Stenosis   - Referral made to se Neurosurgeon   ^<^<^<^<^<^<^<^<^<^<^<^<^<^<^<^<^<^<^<^<^<^<^<^<^<^<^<^<^<^<^<^<^<^<^<^<^ ^>^>^>^>^>^>^>^>^>^>^>^>^>^>^>^>^>^>^>^>^>^>^>^>^>^>^>^>^>^>^>^>^>^>^>^>^

## 2022-12-17 ENCOUNTER — Ambulatory Visit: Payer: BC Managed Care – PPO | Admitting: Neurosurgery

## 2022-12-17 ENCOUNTER — Encounter: Payer: Self-pay | Admitting: Neurosurgery

## 2022-12-17 VITALS — BP 127/87 | Ht 71.0 in | Wt 230.4 lb

## 2022-12-17 DIAGNOSIS — M48062 Spinal stenosis, lumbar region with neurogenic claudication: Secondary | ICD-10-CM

## 2022-12-17 MED ORDER — GABAPENTIN 300 MG PO CAPS: 300.0000 mg | ORAL_CAPSULE | Freq: Every day | ORAL | 0 refills | Status: DC

## 2022-12-17 NOTE — Progress Notes (Signed)
Referring Physician:  Lucky Cowboy, MD [address] [place],  [place] [address]  Primary Physician:  Lucky Cowboy, MD  History of Present Illness: 12/17/2022 Mr. Nkrumah Hackert is a 57 year old with a history of anxiety, GERD, hypertension, CAD, and hyperlipidemia who is here today with a chief complaint of 6+ months of low back pain with intermittent radiating leg pain.  He states that this started without any particular inciting event and has worsened over the last couple of months.  He describes pain that starts in his lower back and radiates into his buttocks down the posterior lateral aspect of his legs and into his ankle.  Describes his pain as a burning/stinging sensation with associated numbness.  This occurs on both the left and right side interchangeably.  He states it is worse with activity particularly with bending to do things such as gardening.  He has been taking Tylenol, Flexeril, and NSAIDs with minimal relief.  He did see his primary care provider who ordered an MRI of his lumbar spine and prescribed Decadron which she has not taken yet.  He denies any significant lower extremity weakness.  Conservative measures:  Physical therapy:  denies Multimodal medical therapy including regular antiinflammatories: celebrex, Flexeril, and Tylenol Injections:  denies epidural steroid injections  Past Surgery: No history of lumbosacral surgeries  LEXX ETZEL has no symptoms of cervical myelopathy.  The symptoms are causing a significant impact on the patient's life.   Review of Systems:  A 10 point review of systems is negative, except for the pertinent positives and negatives detailed in the HPI.  Past Medical History: Past Medical History:  Diagnosis Date   Allergy    GERD (gastroesophageal reflux disease)    Hyperlipidemia    Hypertension    IBS (irritable bowel syndrome)    Traumatic pneumothorax 12/04/2015   Vitamin D deficiency     Past  Surgical History: Past Surgical History:  Procedure Laterality Date   CHEST TUBE INSERTION Right 05-15-02   thoracostomy   ESOPHAGOGASTRODUODENOSCOPY (EGD) WITH PROPOFOL N/A 12/15/2020   Procedure: ESOPHAGOGASTRODUODENOSCOPY (EGD) WITH PROPOFOL;  Surgeon: Earline Mayotte, MD;  Location: [facility] END[facility]COPY;  Service: Endoscopy;  Laterality: N/A;  with possible dilation   TOE SURGERY  2008   right big toe    Allergies: Allergies as of 12/17/2022 - Review Complete 11/20/2022  Allergen Reaction Noted   Lipitor [atorvastatin]  05/16/2014   Prozac [fluoxetine hcl]  07/13/2013   Rosuvastatin  01/25/2019   Simvastatin  05/16/2014    Medications: Outpatient Encounter Medications as of 12/17/2022  Medication Sig   ALPRAZolam (XANAX) 1 MG tablet Take  1/2 to 1 tablet  2 to 3 x / day  ONLY  if needed for Anxiety Attack & please try to limit to 5 days /week to avoid Addiction & Dementia                                                              /                                     TAKE  BY                   MOUTH   celecoxib (CELEBREX) 100 MG capsule Take 1 tab q 12 hours as needed   cholecalciferol (VITAMIN D) 1000 UNITS tablet Take 5,000 Units by mouth daily.    co-enzyme Q-10 30 MG capsule Take 30 mg by mouth 3 (three) times daily.   cyclobenzaprine (FLEXERIL) 10 MG tablet Take 1/2 to 1 tablet 3 x /day as needed for Muscle Spasm   dexamethasone (DECADRON) 4 MG tablet Take 1 tab 3 x day - 3 days, then 2 x day - 3 days, then 1 tab daily   famotidine (PEPCID) 20 MG tablet Take  1 tablet  2 x /day to Prevent Heart burn & Indigestion                                                           /                                       TAKE                                            BY                                              MOUTH   lidocaine-prilocaine (EMLA) cream APPLY TOPICALLY AS NEEDED   lisinopril-hydrochlorothiazide (ZESTORETIC) 20-25 MG tablet Take  1/2 tablet  Daily  for BP                                                                  /                                   TAKE                              BY                               MOUTH                                  ONCE DAILY   MILK THISTLE PO Take by mouth.   montelukast (SINGULAIR) 10 MG tablet Take 1 tablet Daily for Allergies & Asthma / Patient knows to take by mouth   Multiple Vitamins-Minerals (MULTIVITAMIN PO) Take by mouth.   Multiple Vitamins-Minerals (ZINC PO) Take by mouth.   mupirocin ointment (BACTROBAN) 2 % Apply  1 application. topically 2 (two) times daily.   nitroGLYCERIN (NITROGLYN) 2 % ointment Apply   1/2 "  to Hemorrhoid  every 6 hours  as needed   Omega-3 Fatty Acids (FISH OIL PO) Take by mouth.   omeprazole (PRILOSEC) 40 MG capsule TAKE 1 CAPSULE BY MOUTH DAILY TO PREVENT HEARTBURN AND INDIGESTION   PROCTO-MED HC 2.5 % rectal cream APPLY CREAM  RECTALLY 4 TIMES DAILY   rosuvastatin (CRESTOR) 20 MG tablet Take 1 tablet Daily for Cholesterol   No facility-administered encounter medications on file as of 12/17/2022.    Social History: Social History   Tobacco Use   Smoking status: Former    Types: Cigarettes    Quit date: 08/05/2001    Years since quitting: 21.3   Smokeless tobacco: Never   Tobacco comments:    Smoked 5-10 cigarettes per day since age 69  Substance Use Topics   Alcohol use: Yes    Alcohol/week: 10.0 standard drinks of alcohol    Types: 10 Standard drinks or equivalent per week   Drug use: No    Family Medical History: Family History  Problem Relation Age of Onset   Cancer Mother        lung   Hyperlipidemia Mother    Hypertension Father    Hyperlipidemia Father    Dementia Brother    Hyperlipidemia Brother    Hyperlipidemia Brother    Hyperlipidemia Brother    Colon cancer Maternal Grandmother    Diabetes Maternal Grandfather     Physical Examination: Today's Vitals   12/17/22 1320  BP: 127/87  Weight: 104.5 kg  Height: 5\' 11"  (1.803 m)   PainSc: 4   PainLoc: Back   Body mass index is 32.13 kg/m.   General: Patient is well developed, well nourished, calm, collected, and in no apparent distress. Attention to examination is appropriate.  Psychiatric: Patient is non-anxious.  Head:  Pupils equal, round, and reactive to light.  ENT:  Oral mucosa appears well hydrated.  Neck:   Supple.    Respiratory: Patient is breathing without any difficulty.  Extremities: No edema.  Vascular: Palpable dorsal pedal pulses.  Skin:   On exposed skin, there are no abnormal skin lesions.  NEUROLOGICAL:     Awake, alert, oriented to person, place, and time.  Speech is clear and fluent. Fund of knowledge is appropriate.   Cranial Nerves: Pupils equal round and reactive to light.  Facial tone is symmetric.  Facial sensation is symmetric.     Strength: Side Biceps Triceps Deltoid Interossei Grip Wrist Ext. Wrist Flex.  R 5 5 5 5 5 5 5   L 5 5 5 5 5 5 5    Side Iliopsoas Quads Hamstring PF DF EHL  R 5 5 5 5 5 5   L 5 5 5 5 5 5    Reflexes are 2+ and symmetric at the biceps, triceps, brachioradialis, patella and achilles.   Hoffman's is absent.  Clonus is not present.  Toes are down-going.  Bilateral upper and lower extremity sensation is intact to light touch.    Ambulates with a mildly antalgic gait  Medical Decision Making  Imaging: 12/10/22 MRI L spine  IMPRESSION: 1. L3-L4 moderate spinal canal stenosis with mild-to-moderate left and mild right neural foraminal narrowing. Effacement of the lateral recesses at this level likely compresses the descending L4 nerve roots. In addition, the disc bulge at this level may contact the exiting L3 nerves. 2. L4-L5 mild-to-moderate spinal canal stenosis. Effacement of the  left lateral recess likely compresses the descending left L5 nerve roots; narrowing of the right lateral recess at this level could also affect the descending right L5 nerve roots. 3. L2-L3 mild spinal canal  stenosis and mild left neural foraminal narrowing.     Electronically Signed   By: Wiliam Ke M.D.   On: 12/14/2022 18:38  I have personally reviewed the images and agree with the above interpretation.  Assessment and Plan: Mr. Camper is a pleasant 57 y.o. 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  He has attempted multiple medications with minimal relief.  I have started him on gabapentin 300 mg nightly to see if this provides additional relief.  He currently works as a Naval architect and his symptoms are interfering with his quality of life.  We discussed different treatment options including physical therapy and injections.  He would like to start with physical therapy and reserve injections for if this is ineffective.  I have referred him to [facility] for [facility] therapy and will follow-up with him via telephone visit in about 4 weeks to evaluate his progress.  If he worsens in that time he will let us know and we will place a referral for an injection sooner.  He was encouraged to call us with any questions or concerns.  Thank you for involving me in the care of this patient.   I spent a total of 30 minutes in both face-to-face and non-face-to-face activities for this visit on the date of this encounter eluding review of outside records, review of imaging, discussion of symptoms, documentation, and order placement.   [facility]

## 2022-12-26 NOTE — Patient Instructions (Signed)
Below is our plan:  We will continue using oral appliance. I will order a home sleep study to evaluate response to oral appliance.   Please make sure you are staying well hydrated. I recommend 50-60 ounces daily. Well balanced diet and regular exercise encouraged. Consistent sleep schedule with 6-8 hours recommended.   Please continue follow up with care team as directed.   Follow up with me pending sleep study results.   You may receive a survey regarding today's visit. I encourage you to leave honest feed back as I do use this information to improve patient care. Thank you for seeing me today!

## 2022-12-26 NOTE — Progress Notes (Signed)
PATIENT: Matthew Salinas DOB: April 12, 1966  REASON FOR VISIT: follow up HISTORY FROM: patient  Chief Complaint  Patient presents with   Follow-up    Pt in room 2, pt wife in room. Here for cpap follow up. Pt uses dental mouth guard, pt said this works great.      HISTORY OF PRESENT ILLNESS:  01/01/23 ALL:  Matthew Salinas is a 57 y.o. male here today for follow up for OSA. He was last seen by Dr Vickey Huger 06/2022 and unable to tolerate CPAP. Referral was placed to dentistry for dental appliance. He was able to get an oral appliance through his family dentist. He is wearing oral appliance ever night. He reports snoring is nearly resolved. He feels well rested. He denies excessive daytime sleepiness. He keeps CDL. Regular DOT physicals.   HISTORY: (copied from Dr Dohmeier's previous note)  BREWSTER KUZARA is a 57 y.o. 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  So we are meeting to discuss alternatives.   He still describes having restless sleep which is keeping him moving at night, displacing the CPAP interface, creating air leaks,. He is not able to tolerate supine sleep because of back pain and leg pain.  He has used a mouthguard which according to him and his spouse has reduced his snoring this is not a major to measure device. Could he use a dental device ?  He does not want to have his large tonsils removed and he is not a smoker. He had a pneumothorax in 2000.   REVIEW OF SYSTEMS: Out of a complete 14 system review of symptoms, the patient complains only of the following symptoms, none and all other reviewed systems are  negative.  ESS: 2/24   ALLERGIES: Allergies  Allergen Reactions   Lipitor [Atorvastatin]     Myalgias   Prozac [Fluoxetine Hcl]    Rosuvastatin     myalgias   Simvastatin     Myalgias    HOME MEDICATIONS: Outpatient Medications Prior to Visit  Medication Sig Dispense Refill   ALPRAZolam (XANAX) 1 MG tablet Take  1/2 to 1 tablet  2 to 3 x / day  ONLY  if needed for Anxiety Attack & please try to limit to 5 days /week to avoid Addiction & Dementia                                                              /                                     TAKE                    BY                   MOUTH 30 tablet 0  celecoxib (CELEBREX) 100 MG capsule Take 1 tab q 12 hours as needed 60 capsule 2   cholecalciferol (VITAMIN D) 1000 UNITS tablet Take 5,000 Units by mouth daily.      co-enzyme Q-10 30 MG capsule Take 30 mg by mouth 3 (three) times daily.     cyclobenzaprine (FLEXERIL) 10 MG tablet Take 1/2 to 1 tablet 3 x /day as needed for Muscle Spasm 90 tablet 0   famotidine (PEPCID) 20 MG tablet Take  1 tablet  2 x /day to Prevent Heart burn & Indigestion                                                           /                                       TAKE                                            BY                                              MOUTH 180 tablet 3   lidocaine-prilocaine (EMLA) cream APPLY TOPICALLY AS NEEDED 90 g 11   lisinopril-hydrochlorothiazide (ZESTORETIC) 20-25 MG tablet Take  1/2 tablet  Daily  for BP                                                                 /                                   TAKE                              BY                               MOUTH                                  ONCE DAILY (Patient taking differently: Take 12.5 tablets by mouth daily. Take 12.5mg   Daily  for BP                                                                 /  TAKE                              BY                               MOUTH                                   ONCE DAILY) 46 tablet 3   montelukast (SINGULAIR) 10 MG tablet Take 1 tablet Daily for Allergies & Asthma / Patient knows to take by mouth 90 tablet 3   Multiple Vitamins-Minerals (MULTIVITAMIN PO) Take by mouth.     Multiple Vitamins-Minerals (ZINC PO) Take by mouth.     mupirocin ointment (BACTROBAN) 2 % Apply 1 application. topically 2 (two) times daily. 22 g 0   nitroGLYCERIN (NITROGLYN) 2 % ointment Apply   1/2 "  to Hemorrhoid  every 6 hours  as needed 60 g 3   Omega-3 Fatty Acids (FISH OIL PO) Take by mouth.     omeprazole (PRILOSEC) 40 MG capsule TAKE 1 CAPSULE BY MOUTH DAILY TO PREVENT HEARTBURN AND INDIGESTION 90 capsule 1   PROCTO-MED HC 2.5 % rectal cream APPLY CREAM  RECTALLY 4 TIMES DAILY 56 g 3   rosuvastatin (CRESTOR) 10 MG tablet Take 10 mg by mouth daily.     dexamethasone (DECADRON) 4 MG tablet Take 1 tab 3 x day - 3 days, then 2 x day - 3 days, then 1 tab daily (Patient not taking: Reported on 01/01/2023) 20 tablet 0   gabapentin (NEURONTIN) 300 MG capsule Take 1 capsule (300 mg total) by mouth at bedtime. (Patient not taking: Reported on 01/01/2023) 30 capsule 0   MILK THISTLE PO Take by mouth. (Patient not taking: Reported on 01/01/2023)     rosuvastatin (CRESTOR) 20 MG tablet Take 1 tablet Daily for Cholesterol (Patient not taking: Reported on 01/01/2023) 90 tablet 3   No facility-administered medications prior to visit.    PAST MEDICAL HISTORY: Past Medical History:  Diagnosis Date   Allergy    GERD (gastroesophageal reflux disease)    Hyperlipidemia    Hypertension    IBS (irritable bowel syndrome)    Traumatic pneumothorax 12/04/2015   Vitamin D deficiency     PAST SURGICAL HISTORY: Past Surgical History:  Procedure Laterality Date   CHEST TUBE INSERTION Right 05-15-02   thoracostomy   ESOPHAGOGASTRODUODENOSCOPY (EGD) WITH PROPOFOL N/A 12/15/2020   Procedure: ESOPHAGOGASTRODUODENOSCOPY (EGD) WITH PROPOFOL;  Surgeon: Earline Mayotte, MD;  Location: [facility] ENDOSCOPY;  Service: Endoscopy;  Laterality: N/A;  with possible dilation   TOE SURGERY  2008   right big toe    FAMILY HISTORY: Family History  Problem Relation Age of Onset   Cancer Mother        lung   Hyperlipidemia Mother    Hypertension Father    Hyperlipidemia Father    Dementia Brother    Hyperlipidemia Brother    Hyperlipidemia Brother    Hyperlipidemia Brother    Colon cancer Maternal Grandmother    Diabetes Maternal Grandfather     SOCIAL HISTORY: Social History   Socioeconomic History   Marital status: Divorced    Spouse name: Not on file   Number of children: Not on file   Years of education: Not on file  Highest education level: Not on file  Occupational History   Not on file  Tobacco Use   Smoking status: Former    Types: Cigarettes    Quit date: 08/05/2001    Years since quitting: 21.4   Smokeless tobacco: Never   Tobacco comments:    Smoked 5-10 cigarettes per day since age 22  Substance and Sexual Activity   Alcohol use: Yes    Alcohol/week: 10.0 standard drinks of alcohol    Types: 10 Standard drinks or equivalent per week   Drug use: No   Sexual activity: Not on file  Other Topics Concern   Not on file  Social History Narrative   Not on file   Social Determinants of Health   Financial Resource Strain: Not on file  Food Insecurity: Not on file  Transportation Needs: Not on file  Physical Activity: Not on file  Stress: Not on file  Social Connections: Not on file  Intimate Partner Violence: Not on file     PHYSICAL EXAM  Vitals:   01/01/23 1313  BP: (!) 146/94  Pulse: 97  Weight: 225 lb (102.1 kg)  Height: 5\' 11"  (1.803 m)   Body mass index is 31.38 kg/m.  Generalized: Well developed, in no acute distress  Cardiology: normal rate and rhythm, no murmur noted Respiratory: clear to auscultation bilaterally  Neurological examination  Mentation: Alert oriented to time, place, history taking. Follows all  commands speech and language fluent Cranial nerve II-XII: Pupils were equal round reactive to light. Extraocular movements were full, visual field were full  Motor: The motor testing reveals 5 over 5 strength of all 4 extremities. Good symmetric motor tone is noted throughout.  Gait and station: Gait is normal.    DIAGNOSTIC DATA (LABS, IMAGING, TESTING) - I reviewed patient records, labs, notes, testing and imaging myself where available.      No data to display           Lab Results  Component Value Date   WBC 5.8 11/20/2022   HGB 14.9 11/20/2022   HCT 44.5 11/20/2022   MCV 93.9 11/20/2022   PLT 331 11/20/2022      Component Value Date/Time   NA 136 11/20/2022 1028   K 4.6 11/20/2022 1028   CL 99 11/20/2022 1028   CO2 26 11/20/2022 1028   GLUCOSE 104 (H) 11/20/2022 1028   BUN 11 11/20/2022 1028   CREATININE 0.79 11/20/2022 1028   CALCIUM 9.7 11/20/2022 1028   PROT 7.3 11/20/2022 1028   ALBUMIN 4.4 10/09/2016 1030   AST 38 (H) 11/20/2022 1028   ALT 43 11/20/2022 1028   ALKPHOS 48 10/09/2016 1030   BILITOT 0.7 11/20/2022 1028   GFRNONAA 97 01/24/2021 1221   GFRAA 113 01/24/2021 1221   Lab Results  Component Value Date   CHOL 282 (H) 11/20/2022   HDL 56 11/20/2022   LDLCALC  11/20/2022     Comment:     . LDL cholesterol not calculated. Triglyceride levels greater than 400 mg/dL invalidate calculated LDL results. . Reference range: <100 . Desirable range <100 mg/dL for primary prevention;   <70 mg/dL for patients with CHD or diabetic patients  with > or = 2 CHD risk factors. Marland Kitchen LDL-C is now calculated using the Martin-Hopkins  calculation, which is a validated novel method providing  better accuracy than the Friedewald equation in the  estimation of LDL-C.  Horald Pollen et al. Lenox Ahr. 0981;191(47): 2061-2068  (http://education.[facility].com/faq/FAQ164)    TRIG 502 (H)  11/20/2022   CHOLHDL 5.0 (H) 11/20/2022   Lab Results  Component Value Date    HGBA1C 5.5 11/20/2022   Lab Results  Component Value Date   VITAMINB12 799 05/01/2022   Lab Results  Component Value Date   TSH 1.35 11/20/2022     ASSESSMENT AND PLAN 57 y.o. year old male  has a past medical history of Allergy, GERD (gastroesophageal reflux disease), Hyperlipidemia, Hypertension, IBS (irritable bowel syndrome), Traumatic pneumothorax (12/04/2015), and Vitamin D deficiency. here with     ICD-10-CM   1. Severe obstructive sleep apnea-hypopnea syndrome  G47.33 Home sleep test        RONEN KAMMER is doing well with daily use of his oral appliance. Snoring has nearly resolved and he feels well rested. We will repeat HST while using oral appliance to assess how well OSA is managed. He is unable to tolerate CPAP. He was encouraged to continue using his oral appliance nightly. Risks of untreated sleep apnea review and education materials provided. Healthy lifestyle habits encouraged. He will follow up pending sleep study results. He verbalizes understanding and agreement with this plan.    Orders Placed This Encounter  Procedures   Home sleep test    Standing Status:   Future    Standing Expiration Date:   01/01/2024    Order Specific Question:   Where should this test be performed:    Answer:   [facility]     No orders of the defined types were placed in this encounter.     Shawnie Dapper, FNP-C 01/01/2023, 1:53 PM [facility] [address] [place], [place] [address] [phone]

## 2023-01-01 ENCOUNTER — Encounter: Payer: Self-pay | Admitting: Family Medicine

## 2023-01-01 ENCOUNTER — Ambulatory Visit: Payer: BC Managed Care – PPO | Admitting: Family Medicine

## 2023-01-01 VITALS — BP 146/94 | HR 97 | Ht 71.0 in | Wt 225.0 lb

## 2023-01-01 DIAGNOSIS — G4733 Obstructive sleep apnea (adult) (pediatric): Secondary | ICD-10-CM

## 2023-01-06 ENCOUNTER — Telehealth: Payer: Self-pay

## 2023-01-06 NOTE — Telephone Encounter (Signed)
LVM for pt to call back to schedule sleep study.  

## 2023-01-14 ENCOUNTER — Ambulatory Visit (INDEPENDENT_AMBULATORY_CARE_PROVIDER_SITE_OTHER): Payer: BC Managed Care – PPO | Admitting: Neurosurgery

## 2023-01-14 DIAGNOSIS — M48062 Spinal stenosis, lumbar region with neurogenic claudication: Secondary | ICD-10-CM | POA: Diagnosis not present

## 2023-01-14 NOTE — Progress Notes (Signed)
Neurosurgery Telephone (Audio-Only) Note  Requesting Provider     Lucky Cowboy, MD [address] [place],  [place] [address] T: [phone] F: [phone]  Primary Care Provider Lucky Cowboy, MD [address] [place] [address] T: [phone] F: [phone]  Telehealth visit was conducted with Matthew Salinas, a 57 y.o. male via telephone.  History of Present Illness: Matthew Salinas is a 57 year old presenting today via telephone visit.  Overall he reports improvement of his back and buttock pain with physical therapy.  He states he has 1 more session this week.  He feels that his current symptoms are manageable.  12/17/2022 Matthew Salinas is a 57 year old with a history of anxiety, GERD, hypertension, CAD, and hyperlipidemia who is here today with a chief complaint of 6+ months of low back pain with intermittent radiating leg pain.  He states that this started without any particular inciting event and has worsened over the last couple of months.  He describes pain that starts in his lower back and radiates into his buttocks down the posterior lateral aspect of his legs and into his ankle.  Describes his pain as a burning/stinging sensation with associated numbness.  This occurs on both the left and right side interchangeably.  He states it is worse with activity particularly with bending to do things such as gardening.  He has been taking Tylenol, Flexeril, and NSAIDs with minimal relief.  He did see his primary care provider who ordered an MRI of his lumbar spine and prescribed Decadron which she has not taken yet.  He denies any significant lower extremity weakness.   Conservative measures:  Physical therapy:  denies Multimodal medical therapy including regular antiinflammatories: celebrex, Flexeril, and Tylenol Injections:  denies epidural steroid injections   Past Surgery: No history of lumbosacral surgeries   Matthew Salinas  has no symptoms of cervical myelopathy.   The symptoms are causing a significant impact on the patient's life.   General Review of Systems:  A ROS was performed including pertinent positive and negatives as documented.  All other systems are negative.   Prior to Admission medications   Medication Sig Start Date End Date Taking? Authorizing Provider  ALPRAZolam Prudy Feeler) 1 MG tablet Take  1/2 to 1 tablet  2 to 3 x / day  ONLY  if needed for Anxiety Attack & please try to limit to 5 days /week to avoid Addiction & Dementia                                                              /                                     TAKE                    BY                   MOUTH 09/17/22   Raynelle Dick, NP  celecoxib (CELEBREX) 100 MG capsule Take 1 tab q 12 hours as needed 11/25/22   Raynelle Dick, NP  cholecalciferol (VITAMIN D) 1000 UNITS tablet Take 5,000 Units by mouth daily.  [provider]  co-enzyme Q-10 30 MG capsule Take 30 mg by mouth 3 (three) times daily.    [provider]  cyclobenzaprine (FLEXERIL) 10 MG tablet Take 1/2 to 1 tablet 3 x /day as needed for Muscle Spasm 11/20/22   Lucky Cowboy, MD  dexamethasone (DECADRON) 4 MG tablet Take 1 tab 3 x day - 3 days, then 2 x day - 3 days, then 1 tab daily Patient not taking: Reported on 01/01/2023 11/20/22   Lucky Cowboy, MD  famotidine (PEPCID) 20 MG tablet Take  1 tablet  2 x /day to Prevent Heart burn & Indigestion                                                           /                                       TAKE                                            BY                                              MOUTH 11/25/22   Lucky Cowboy, MD  gabapentin (NEURONTIN) 300 MG capsule Take 1 capsule (300 mg total) by mouth at bedtime. Patient not taking: Reported on 01/01/2023 12/17/22   Susanne Borders, PA  lidocaine-prilocaine (EMLA) cream APPLY TOPICALLY AS NEEDED 01/15/18   Lucky Cowboy, MD   lisinopril-hydrochlorothiazide (ZESTORETIC) 20-25 MG tablet Take  1/2 tablet  Daily  for BP                                                                 /                                   TAKE                              BY                               MOUTH                                  ONCE DAILY Patient taking differently: Take 12.5 tablets by mouth daily. Take 12.5mg   Daily  for BP                                                                 /  TAKE                              BY                               MOUTH                                  ONCE DAILY 03/18/22   Lucky Cowboy, MD  MILK THISTLE PO Take by mouth. Patient not taking: Reported on 01/01/2023    [provider]  montelukast (SINGULAIR) 10 MG tablet Take 1 tablet Daily for Allergies & Asthma / Patient knows to take by mouth 03/11/21   Lucky Cowboy, MD  Multiple Vitamins-Minerals (MULTIVITAMIN PO) Take by mouth.    [provider]  Multiple Vitamins-Minerals (ZINC PO) Take by mouth.    [provider]  mupirocin ointment (BACTROBAN) 2 % Apply 1 application. topically 2 (two) times daily. 11/21/21   Raynelle Dick, NP  nitroGLYCERIN (NITROGLYN) 2 % ointment Apply   1/2 "  to Hemorrhoid  every 6 hours  as needed 05/02/22   Lucky Cowboy, MD  Omega-3 Fatty Acids (FISH OIL PO) Take by mouth.    [provider]  omeprazole (PRILOSEC) 40 MG capsule TAKE 1 CAPSULE BY MOUTH DAILY TO PREVENT HEARTBURN AND INDIGESTION 07/22/22   Adela Glimpse, NP  PROCTO-MED HC 2.5 % rectal cream APPLY CREAM  RECTALLY 4 TIMES DAILY 07/04/20   Lucky Cowboy, MD  rosuvastatin (CRESTOR) 10 MG tablet Take 10 mg by mouth daily.    [provider]  rosuvastatin (CRESTOR) 20 MG tablet Take 1 tablet Daily for Cholesterol Patient not taking: Reported on 01/01/2023 01/08/22   Raynelle Dick, NP    DATA REVIEWED    Imaging Studies  MRI L spine IMPRESSION: 1.  L3-L4 moderate spinal canal stenosis with mild-to-moderate left and mild right neural foraminal narrowing. Effacement of the lateral recesses at this level likely compresses the descending L4 nerve roots. In addition, the disc bulge at this level may contact the exiting L3 nerves. 2. L4-L5 mild-to-moderate spinal canal stenosis. Effacement of the left lateral recess likely compresses the descending left L5 nerve roots; narrowing of the right lateral recess at this level could also affect the descending right L5 nerve roots. 3. L2-L3 mild spinal canal stenosis and mild left neural foraminal narrowing.     Electronically Signed   By: Wiliam Ke M.D.   On: 12/14/2022 18:38  IMPRESSION  Matthew Salinas is a 57 y.o. male who I performed a telephone encounter today for evaluation and management of: Lumbar stenosis with neurogenic claudication  PLAN  Matthew Salinas is a pleasant 57 year old presenting with lumbar stenosis and corresponding neurogenic claudication.  We discussed his MRI results again and further plan of care.  As of now he feels like his current symptoms are manageable.  I recommend he continue with physical therapy as tolerated.  Should his symptoms recur or worsen, we are more than happy to discuss referring him for injections versus getting him scheduled with Dr. Marcell Barlow to discuss surgery depending on his preference.  I will otherwise see him going forward on an as-needed basis.  He expressed understanding and was in agreement with this plan.  No orders of the defined types were placed in this encounter.  DISPOSITION  Follow up: In person appointment in  PRN  Susanne Borders, PA   TELEPHONE DOCUMENTATION   This visit was performed via telephone.  Patient location: home Provider location: office  I spent a total of 10 minutes non-face-to-face activities for this visit on the date of this encounter including review of current clinical condition and response to  treatment.  The patient is aware of and accepts the limits of this telehealth visit.

## 2023-01-17 ENCOUNTER — Other Ambulatory Visit: Payer: Self-pay | Admitting: Internal Medicine

## 2023-01-17 DIAGNOSIS — J3089 Other allergic rhinitis: Secondary | ICD-10-CM

## 2023-02-14 ENCOUNTER — Other Ambulatory Visit: Payer: Self-pay | Admitting: Nurse Practitioner

## 2023-02-20 ENCOUNTER — Telehealth: Payer: Self-pay | Admitting: Nurse Practitioner

## 2023-02-24 ENCOUNTER — Other Ambulatory Visit: Payer: Self-pay | Admitting: Internal Medicine

## 2023-02-24 ENCOUNTER — Ambulatory Visit
Admission: RE | Admit: 2023-02-24 | Discharge: 2023-02-24 | Disposition: A | Discharge status: Home / Self Care | Payer: BC Managed Care – PPO | Attending: Cardiology | Admitting: Cardiology

## 2023-02-24 ENCOUNTER — Other Ambulatory Visit: Payer: Self-pay | Admitting: Family Medicine

## 2023-02-24 ENCOUNTER — Ambulatory Visit: Admission: RE | Admit: 2023-02-24 | Payer: BC Managed Care – PPO | Source: Ambulatory Visit

## 2023-02-24 DIAGNOSIS — M5412 Radiculopathy, cervical region: Secondary | ICD-10-CM

## 2023-02-24 DIAGNOSIS — M9901 Segmental and somatic dysfunction of cervical region: Secondary | ICD-10-CM | POA: Diagnosis present

## 2023-02-24 DIAGNOSIS — G4700 Insomnia, unspecified: Secondary | ICD-10-CM

## 2023-02-24 MED ORDER — TRAZODONE HCL 150 MG PO TABS
ORAL_TABLET | ORAL | 0 refills | Status: DC
Start: 2023-02-24 — End: 2023-05-29

## 2023-02-24 NOTE — Progress Notes (Deleted)
FOLLOW UP 3 MONTH  Assessment and Plan:   Hypertension Well controlled with current medications - 1/2 tab lisinopril/hctz 20/25mg  QD Monitor blood pressure at home; patient to call if consistently greater than 130/80 Continue DASH diet.   Reminder to go to the ER if any CP, SOB, nausea, dizziness, severe HA, changes vision/speech, left arm numbness and tingling and jaw pain. Check CBC  Cholesterol Currently taking Rosuvastatin 20 mg QD, has had some leg cramps but uncertain if related to meds- will monitor Continue low cholesterol diet and exercise.  Check lipid panel  Other abnormal glucose Recent A1Cs well controlled , wants A1c checked as he is concerned cholesterol medication(Zetia) previously raised his blood sugar- currently on Rosuvastatin Continue diet and exercise.  Perform daily foot/skin check, notify office of any concerning changes.  Check A1C; check CMP  Obesity Long discussion about weight loss, diet, and exercise Recommended diet heavy in fruits and veggies and low in animal meats, cheeses, and dairy products, appropriate calorie intake Discussed ideal weight for height and  weight goal (200 lb) Will follow up in 3 months  OSA on CPAP Continue to follow with Dr. Vickey Huger Use CPAP nightly  Vitamin D Def Near goal at last visit; continue supplementation to maintain goal of 60-100 Defer Vit D level  Anxiety/Insomnia Well managed by current regimen; continue medications Using Alprazolam 1mg , half tablet at night for sleep. Stress management techniques discussed, increase water, good sleep hygiene discussed, increase exercise, and increase veggies.   Medication Management  Continued  Chronic bilateral back pain     Continue diet and meds as discussed. Further disposition pending results of labs. Discussed med's effects and SE's.   Over 30 minutes of face to face interview, exam, counseling, chart review, and critical decision making was performed.    Future Appointments  Date Time Provider Department Center  02/25/2023 10:30 AM Raynelle Dick, NP [facility] None  03/11/2023 10:00 AM [facility] None  05/06/2023 11:00 AM Lucky Cowboy, MD [facility] None    ----------------------------------------------------------------------------------------------------------------------  HPI 57 y.o. male  presents for 3 month follow up on hypertension, cholesterol, glucose management, obesity and vitamin D deficiency.    Patient was evaluated  by Dr Vickey Huger and sleep study which showed sleep apnea. He is trying to use CPAP- approx 3-5 hours on CPAP  He is feeling more stressed since his father passed away 2022/08/17.  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  Insomina he is using alprazolam 1/2 tablet which is helping.  He has some lower back pain, has seen [facility], has MRI with "arthritis"; has been taking tylenol and meloxicam, has had some hip spasms. He has been having some pain that will go down his right leg that is sharp shooting.   BMI is There is no height or weight on file to calculate BMI., he has been working on diet and exercise, he is very active in his yard/garden, also physical job. Eating lots of veggies.  Wt Readings from Last 3 Encounters:  01/01/23 225 lb (102.1 kg)  12/17/22 230 lb 6.4 oz (104.5 kg)  11/20/22 228 lb 3.2 oz (103.5 kg)    His blood pressure has been controlled at home (115-130s/70-80s), Currently on Zestroetic 20/25mg  1/2 tab daily, today their BP is    BP Readings from Last 3 Encounters:  01/01/23 (!) 146/94  12/17/22 127/87  11/20/22 138/78  He does workout. He denies chest pain, shortness of breath, dizziness.    He  is on cholesterol medication Rosuvastatin 20 mg 1/2 tab daily(was on zetia 10 mg daily and livalo 2 mg daily but reports had severe fatigue, also some leg pain and stopped taking). Hx of statin myalgias with other statins.   His cholesterol is not at goal, remains  severely elevated. He had cardio IQ panel in 05/2017 which showed LDL particle 2305, LDL small 529, apolipoprotein B of 146. Family history of hyperlipidemia but no MI/CVA. The cholesterol last visit was:   Lab Results  Component Value Date   CHOL 282 (H) 11/20/2022   HDL 56 11/20/2022   LDLCALC  11/20/2022     Comment:     . LDL cholesterol not calculated. Triglyceride levels greater than 400 mg/dL invalidate calculated LDL results. . Reference range: <100 . Desirable range <100 mg/dL for primary prevention;   <70 mg/dL for patients with CHD or diabetic patients  with > or = 2 CHD risk factors. Marland Kitchen LDL-C is now calculated using the Martin-Hopkins  calculation, which is a validated novel method providing  better accuracy than the Friedewald equation in the  estimation of LDL-C.  Horald Pollen et al. Lenox Ahr. 1610;960(45): 2061-2068  (http://education.[facility].com/faq/FAQ164)    TRIG 502 (H) 11/20/2022   CHOLHDL 5.0 (H) 11/20/2022    He has been working on diet and exercise for glucose management, and denies foot ulcerations, increased appetite, nausea, paresthesia of the feet, polydipsia, polyuria, visual disturbances, vomiting and weight loss. Last A1C in the office was:  Lab Results  Component Value Date   HGBA1C 5.5 11/20/2022    Patient is on Vitamin D supplement.   Lab Results  Component Value Date   VD25OH 54 11/20/2022       Current Medications:  Current Outpatient Medications on File Prior to Visit  Medication Sig   ALPRAZolam (XANAX) 1 MG tablet Take  1/2 to 1 tablet  2 to 3 x / day  ONLY  if needed for Anxiety Attack & please try to limit to 5 days /week to avoid Addiction & Dementia                                                              /                                     TAKE                    BY                   MOUTH   celecoxib (CELEBREX) 100 MG capsule Take 1 tab q 12 hours as needed   cholecalciferol (VITAMIN D) 1000 UNITS tablet Take 5,000 Units  by mouth daily.    co-enzyme Q-10 30 MG capsule Take 30 mg by mouth 3 (three) times daily.   cyclobenzaprine (FLEXERIL) 10 MG tablet Take 1/2 to 1 tablet 3 x /day as needed for Muscle Spasm   dexamethasone (DECADRON) 4 MG tablet Take 1 tab 3 x day - 3 days, then 2 x day - 3 days, then 1 tab daily (Patient not taking: Reported on 01/01/2023)   famotidine (PEPCID) 20 MG tablet Take  1 tablet  2 x /day to Prevent Heart burn & Indigestion                                                           /                                       TAKE                                            BY                                              MOUTH   gabapentin (NEURONTIN) 300 MG capsule Take 1 capsule (300 mg total) by mouth at bedtime. (Patient not taking: Reported on 01/01/2023)   lidocaine-prilocaine (EMLA) cream APPLY TOPICALLY AS NEEDED   lisinopril-hydrochlorothiazide (ZESTORETIC) 20-25 MG tablet Take  1/2 tablet  Daily  for BP                                                                 /                                   TAKE                              BY                               MOUTH                                  ONCE DAILY (Patient taking differently: Take 12.5 tablets by mouth daily. Take 12.5mg   Daily  for BP                                                                 /                                   TAKE                              BY  MOUTH                                  ONCE DAILY)   MILK THISTLE PO Take by mouth. (Patient not taking: Reported on 01/01/2023)   montelukast (SINGULAIR) 10 MG tablet TAKE 1 TABLET BY MOUTH ONCE DAILY FOR ALLERGIES AND  ASTHMA   Multiple Vitamins-Minerals (MULTIVITAMIN PO) Take by mouth.   Multiple Vitamins-Minerals (ZINC PO) Take by mouth.   mupirocin ointment (BACTROBAN) 2 % Apply 1 application. topically 2 (two) times daily.   nitroGLYCERIN (NITROGLYN) 2 % ointment Apply   1/2 "  to Hemorrhoid  every 6 hours  as needed    Omega-3 Fatty Acids (FISH OIL PO) Take by mouth.   omeprazole (PRILOSEC) 40 MG capsule TAKE 1 CAPSULE BY MOUTH DAILY TO PREVENT HEARTBURN AND INDIGESTION   PROCTO-MED HC 2.5 % rectal cream APPLY CREAM  RECTALLY 4 TIMES DAILY   rosuvastatin (CRESTOR) 10 MG tablet Take 10 mg by mouth daily.   rosuvastatin (CRESTOR) 20 MG tablet TAKE 1 TABLET BY MOUTH ONCE DAILY FOR CHOLESTEROL   No current facility-administered medications on file prior to visit.     Allergies:  Allergies  Allergen Reactions   Lipitor [Atorvastatin]     Myalgias   Prozac [Fluoxetine Hcl]    Rosuvastatin     myalgias   Simvastatin     Myalgias     Medical History:  Past Medical History:  Diagnosis Date   Allergy    GERD (gastroesophageal reflux disease)    Hyperlipidemia    Hypertension    IBS (irritable bowel syndrome)    Traumatic pneumothorax 12/04/2015   Vitamin D deficiency    Family history- Reviewed and unchanged Social history- Reviewed and unchanged   Review of Systems  Constitutional:  Negative for chills, fever and weight loss.  HENT:  Negative for congestion and hearing loss.   Eyes:  Negative for blurred vision and double vision.  Respiratory:  Negative for cough and shortness of breath.   Cardiovascular:  Negative for chest pain, palpitations, orthopnea and leg swelling.  Gastrointestinal:  Negative for abdominal pain, constipation, diarrhea, heartburn, nausea and vomiting.  Musculoskeletal:  Positive for back pain (low back , occ shooting down right leg) and myalgias (occ muscle cramp lower legs). Negative for falls and joint pain.  Skin:  Negative for rash.  Neurological:  Negative for dizziness, tingling, tremors, loss of consciousness and headaches.  Psychiatric/Behavioral:  Negative for depression, memory loss and suicidal ideas. The patient is nervous/anxious and has insomnia.        Physical Exam: There were no vitals taken for this visit. Wt Readings from Last 3 Encounters:   01/01/23 225 lb (102.1 kg)  12/17/22 230 lb 6.4 oz (104.5 kg)  11/20/22 228 lb 3.2 oz (103.5 kg)   General Appearance: Well nourished, in no apparent distress. Eyes: PERRLA, EOMs, conjunctiva no swelling or erythema Sinuses: No Frontal/maxillary tenderness ENT/Mouth: Ext aud canals clear, TMs without erythema, bulging. No erythema, swelling, or exudate on post pharynx.  Tonsils not swollen or erythematous. Hearing normal.  Neck: Supple, thyroid normal.  Respiratory: Respiratory effort normal, BS equal bilaterally without rales, rhonchi, wheezing or stridor.  Cardio: RRR with no MRGs. Brisk peripheral pulses without edema.  Abdomen: Soft, + BS.  Non tender, no guarding, rebound, hernias, masses. Lymphatics: Non tender without lymphadenopathy.  Musculoskeletal: Full ROM, 5/5 strength, Normal gait. Skin: Warm, dry  without rashes, lesions, ecchymosis.  Neuro: Cranial nerves intact. No cerebellar symptoms.  Psych: Awake and oriented X 3, normal affect, Insight and Judgment appropriate.    Revonda Humphrey ANP-C  [facility]  02/24/2023

## 2023-02-25 ENCOUNTER — Ambulatory Visit: Payer: BC Managed Care – PPO | Admitting: Nurse Practitioner

## 2023-02-26 ENCOUNTER — Other Ambulatory Visit: Payer: Self-pay | Admitting: Internal Medicine

## 2023-02-26 DIAGNOSIS — I10 Essential (primary) hypertension: Secondary | ICD-10-CM

## 2023-02-27 ENCOUNTER — Ambulatory Visit: Payer: BC Managed Care – PPO | Admitting: Nurse Practitioner

## 2023-03-05 ENCOUNTER — Ambulatory Visit (INDEPENDENT_AMBULATORY_CARE_PROVIDER_SITE_OTHER): Payer: BC Managed Care – PPO | Admitting: Nurse Practitioner

## 2023-03-05 ENCOUNTER — Encounter: Payer: Self-pay | Admitting: Nurse Practitioner

## 2023-03-05 VITALS — BP 128/82 | HR 73 | Temp 97.7°F | Ht 71.0 in | Wt 223.6 lb

## 2023-03-05 DIAGNOSIS — M5442 Lumbago with sciatica, left side: Secondary | ICD-10-CM

## 2023-03-05 DIAGNOSIS — R7309 Other abnormal glucose: Secondary | ICD-10-CM | POA: Diagnosis not present

## 2023-03-05 DIAGNOSIS — E782 Mixed hyperlipidemia: Secondary | ICD-10-CM

## 2023-03-05 DIAGNOSIS — G4733 Obstructive sleep apnea (adult) (pediatric): Secondary | ICD-10-CM

## 2023-03-05 DIAGNOSIS — Z79899 Other long term (current) drug therapy: Secondary | ICD-10-CM

## 2023-03-05 DIAGNOSIS — F419 Anxiety disorder, unspecified: Secondary | ICD-10-CM

## 2023-03-05 DIAGNOSIS — G4700 Insomnia, unspecified: Secondary | ICD-10-CM

## 2023-03-05 DIAGNOSIS — E669 Obesity, unspecified: Secondary | ICD-10-CM

## 2023-03-05 DIAGNOSIS — I10 Essential (primary) hypertension: Secondary | ICD-10-CM

## 2023-03-05 DIAGNOSIS — M5441 Lumbago with sciatica, right side: Secondary | ICD-10-CM

## 2023-03-05 DIAGNOSIS — E559 Vitamin D deficiency, unspecified: Secondary | ICD-10-CM

## 2023-03-05 DIAGNOSIS — G8929 Other chronic pain: Secondary | ICD-10-CM

## 2023-03-05 LAB — CBC WITH DIFFERENTIAL/PLATELET
Absolute Monocytes: 478 cells/uL (ref 200–950)
Basophils Absolute: 71 cells/uL (ref 0–200)
Basophils Relative: 1.2 %
Eosinophils Absolute: 100 cells/uL (ref 15–500)
Eosinophils Relative: 1.7 %
HCT: 44.3 % (ref 38.5–50.0)
Hemoglobin: 14.6 g/dL (ref 13.2–17.1)
Lymphs Abs: 1752 cells/uL (ref 850–3900)
MCH: 31.7 pg (ref 27.0–33.0)
MCHC: 33 g/dL (ref 32.0–36.0)
MCV: 96.1 fL (ref 80.0–100.0)
MPV: 11 fL (ref 7.5–12.5)
Monocytes Relative: 8.1 %
Neutro Abs: 3499 cells/uL (ref 1500–7800)
Neutrophils Relative %: 59.3 %
Platelets: 303 10*3/uL (ref 140–400)
RBC: 4.61 10*6/uL (ref 4.20–5.80)
RDW: 11.9 % (ref 11.0–15.0)
Total Lymphocyte: 29.7 %
WBC: 5.9 10*3/uL (ref 3.8–10.8)

## 2023-03-05 NOTE — Patient Instructions (Signed)

## 2023-03-05 NOTE — Progress Notes (Signed)
FOLLOW UP 3 MONTH  Assessment and Plan:   Hypertension Well controlled with current medications - 1/2 tab lisinopril/hctz 20/25mg  QD Monitor blood pressure at home; patient to call if consistently greater than 130/80 Continue DASH diet.   Reminder to go to the ER if any CP, SOB, nausea, dizziness, severe HA, changes vision/speech, left arm numbness and tingling and jaw pain. Check CBC  Cholesterol Currently taking Rosuvastatin 20 mg QD, has had some leg cramps but uncertain if related to meds- will monitor Continue low cholesterol diet and exercise.  Check lipid panel  Other abnormal glucose Recent A1Cs well controlled , wants A1c checked as he is concerned cholesterol medication(Zetia) previously raised his blood sugar- currently on Rosuvastatin Continue diet and exercise.  Perform daily foot/skin check, notify office of any concerning changes.  Check A1C; check CMP  Obesity Long discussion about weight loss, diet, and exercise Recommended diet heavy in fruits and veggies and low in animal meats, cheeses, and dairy products, appropriate calorie intake Discussed ideal weight for height and  weight goal (200 lb) Will follow up in 3 months  OSA on CPAP Continue to follow with Dr. Vickey Huger Use CPAP nightly  Vitamin D Def Near goal at last visit; continue supplementation to maintain goal of 60-100 Defer Vit D level  Anxiety/Insomnia Well managed by current regimen; continue medications Using Alprazolam 1mg , half tablet at night for sleep. Stress management techniques discussed, increase water, good sleep hygiene discussed, increase exercise, and increase veggies.   Medication Management  Continued  Chronic bilateral back pain      Notify office for further evaluation and treatment, questions or concerns if any reported s/s fail to improve.   The patient was advised to call back or seek an in-person evaluation if any symptoms worsen or if the condition fails to improve  as anticipated.   Further disposition pending results of labs. Discussed med's effects and SE's.    I discussed the assessment and treatment plan with the patient. The patient was provided an opportunity to ask questions and all were answered. The patient agreed with the plan and demonstrated an understanding of the instructions.  Discussed med's effects and SE's. Screening labs and tests as requested with regular follow-up as recommended.  I provided 25 minutes of face-to-face time during this encounter including counseling, chart review, and critical decision making was preformed.  Today's Plan of Care is based on a patient-centered health care approach known as shared decision making - the decisions, tests and treatments allow for patient preferences and values to be balanced with clinical evidence.     Future Appointments  Date Time Provider Department Center  03/05/2023  9:00 AM Adela Glimpse, NP [facility] None  03/11/2023 10:00 AM [facility] None  05/06/2023 11:00 AM Lucky Cowboy, MD [facility] None    ----------------------------------------------------------------------------------------------------------------------  HPI 57 y.o. male  presents for 3 month follow up on hypertension, cholesterol, glucose management, obesity and vitamin D deficiency.   He has a hx of spinal stenosis, sees Neurosurgery, last via tele-visit 01/14/23.  He has been attending PT and reported improvement in back and buttocks.  He is continued weekly therapy but has now finished.  Possible injections versus surgery.  Has started with a chiropractor which he plans to continue to move forward with for now.  Takes Celebrex and Tylenol which are not very effective.    Patient was evaluated  by Dr Vickey Huger and sleep study which showed sleep apnea. He is trying to use  CPAP- approx 3-5 hours on CPAP  He is feeling more stressed since his father passed away Aug 28, 2022.  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  Insomina he is using alprazolam 1/2 tablet which is helping.  He has some lower back pain, has seen [facility], has MRI with "arthritis"; has been taking tylenol and meloxicam, has had some hip spasms. He has been having some pain that will go down his right leg that is sharp shooting.   BMI is There is no height or weight on file to calculate BMI., he has been working on diet and exercise, he is very active in his yard/garden, also physical job. Eating lots of veggies.  Wt Readings from Last 3 Encounters:  01/01/23 225 lb (102.1 kg)  12/17/22 230 lb 6.4 oz (104.5 kg)  11/20/22 228 lb 3.2 oz (103.5 kg)    His blood pressure has been controlled at home (115-130s/70-80s), Currently on Zestroetic 20/25mg  1/2 tab daily, today their BP is    BP Readings from Last 3 Encounters:  01/01/23 (!) 146/94  12/17/22 127/87  11/20/22 138/78  He does workout. He denies chest pain, shortness of breath, dizziness.    He is on cholesterol medication Rosuvastatin 20 mg 1/2 tab daily(was on zetia 10 mg daily and livalo 2 mg daily but reports had severe fatigue, also some leg pain and stopped taking). Hx of statin myalgias with other statins.   His cholesterol is not at goal, remains severely elevated. He had cardio IQ panel in 05/2017 which showed LDL particle 2305, LDL small 529, apolipoprotein B of 146. Family history of hyperlipidemia but no MI/CVA. The cholesterol last visit was:   Lab Results  Component Value Date   CHOL 282 (H) 11/20/2022   HDL 56 11/20/2022   LDLCALC  11/20/2022     Comment:     . LDL cholesterol not calculated. Triglyceride levels greater than 400 mg/dL invalidate calculated LDL results. . Reference range: <100 . Desirable range <100 mg/dL for primary prevention;   <70 mg/dL for patients with CHD or diabetic patients  with > or = 2 CHD risk factors. Marland Kitchen LDL-C is now calculated using the Martin-Hopkins  calculation, which is a validated novel method providing   better accuracy than the Friedewald equation in the  estimation of LDL-C.  Horald Pollen et al. Lenox Ahr. 9562;130(86): 2061-2068  (http://education.[facility].com/faq/FAQ164)    TRIG 502 (H) 11/20/2022   CHOLHDL 5.0 (H) 11/20/2022    He has been working on diet and exercise for glucose management, and denies foot ulcerations, increased appetite, nausea, paresthesia of the feet, polydipsia, polyuria, visual disturbances, vomiting and weight loss. Last A1C in the office was:  Lab Results  Component Value Date   HGBA1C 5.5 11/20/2022    Patient is on Vitamin D supplement.   Lab Results  Component Value Date   VD25OH 54 11/20/2022       Current Medications:  Current Outpatient Medications on File Prior to Visit  Medication Sig   celecoxib (CELEBREX) 100 MG capsule Take 1 tab q 12 hours as needed   cholecalciferol (VITAMIN D) 1000 UNITS tablet Take 5,000 Units by mouth daily.    co-enzyme Q-10 30 MG capsule Take 30 mg by mouth 3 (three) times daily.   cyclobenzaprine (FLEXERIL) 10 MG tablet Take 1/2 to 1 tablet 3 x /day as needed for Muscle Spasm   dexamethasone (DECADRON) 4 MG tablet Take 1 tab 3 x day - 3 days, then 2 x  day - 3 days, then 1 tab daily (Patient not taking: Reported on 01/01/2023)   famotidine (PEPCID) 20 MG tablet Take  1 tablet  2 x /day to Prevent Heart burn & Indigestion                                                           /                                       TAKE                                            BY                                              MOUTH   gabapentin (NEURONTIN) 300 MG capsule Take 1 capsule (300 mg total) by mouth at bedtime. (Patient not taking: Reported on 01/01/2023)   lidocaine-prilocaine (EMLA) cream APPLY TOPICALLY AS NEEDED   lisinopril-hydrochlorothiazide (ZESTORETIC) 20-25 MG tablet TAKE 1/2 (ONE-HALF) TABLET BY MOUTH ONCE DAILY FOR BLOOD PRESSURE   MILK THISTLE PO Take by mouth. (Patient not taking: Reported on 01/01/2023)    montelukast (SINGULAIR) 10 MG tablet TAKE 1 TABLET BY MOUTH ONCE DAILY FOR ALLERGIES AND  ASTHMA   Multiple Vitamins-Minerals (MULTIVITAMIN PO) Take by mouth.   Multiple Vitamins-Minerals (ZINC PO) Take by mouth.   mupirocin ointment (BACTROBAN) 2 % Apply 1 application. topically 2 (two) times daily.   nitroGLYCERIN (NITROGLYN) 2 % ointment Apply   1/2 "  to Hemorrhoid  every 6 hours  as needed   Omega-3 Fatty Acids (FISH OIL PO) Take by mouth.   omeprazole (PRILOSEC) 40 MG capsule TAKE 1 CAPSULE BY MOUTH DAILY TO PREVENT HEARTBURN AND INDIGESTION   PROCTO-MED HC 2.5 % rectal cream APPLY CREAM  RECTALLY 4 TIMES DAILY   rosuvastatin (CRESTOR) 10 MG tablet Take 10 mg by mouth daily.   rosuvastatin (CRESTOR) 20 MG tablet TAKE 1 TABLET BY MOUTH ONCE DAILY FOR CHOLESTEROL   traZODone (DESYREL) 150 MG tablet Take 1/2 to 1 tablet   1 to 2 hours before Bedtime as needed for Sleep  ( Replaces Xanax / Alprazolam )   No current facility-administered medications on file prior to visit.     Allergies:  Allergies  Allergen Reactions   Lipitor [Atorvastatin]     Myalgias   Prozac [Fluoxetine Hcl]    Rosuvastatin     myalgias   Simvastatin     Myalgias     Medical History:  Past Medical History:  Diagnosis Date   Allergy    GERD (gastroesophageal reflux disease)    Hyperlipidemia    Hypertension    IBS (irritable bowel syndrome)    Traumatic pneumothorax 12/04/2015   Vitamin D deficiency    Family history- Reviewed and unchanged Social history- Reviewed and unchanged   Review of Systems  Constitutional:  Negative for chills, fever and weight loss.  HENT:  Negative for  congestion and hearing loss.   Eyes:  Negative for blurred vision and double vision.  Respiratory:  Negative for cough and shortness of breath.   Cardiovascular:  Negative for chest pain, palpitations, orthopnea and leg swelling.  Gastrointestinal:  Negative for abdominal pain, constipation, diarrhea, heartburn, nausea  and vomiting.  Musculoskeletal:  Positive for back pain (low back , occ shooting down right leg) and myalgias (occ muscle cramp lower legs). Negative for falls and joint pain.  Skin:  Negative for rash.  Neurological:  Negative for dizziness, tingling, tremors, loss of consciousness and headaches.  Psychiatric/Behavioral:  Negative for depression, memory loss and suicidal ideas. The patient is nervous/anxious and has insomnia.        Physical Exam: There were no vitals taken for this visit. Wt Readings from Last 3 Encounters:  01/01/23 225 lb (102.1 kg)  12/17/22 230 lb 6.4 oz (104.5 kg)  11/20/22 228 lb 3.2 oz (103.5 kg)   General Appearance: Well nourished, in no apparent distress. Eyes: PERRLA, EOMs, conjunctiva no swelling or erythema Sinuses: No Frontal/maxillary tenderness ENT/Mouth: Ext aud canals clear, TMs without erythema, bulging. No erythema, swelling, or exudate on post pharynx.  Tonsils not swollen or erythematous. Hearing normal.  Neck: Supple, thyroid normal.  Respiratory: Respiratory effort normal, BS equal bilaterally without rales, rhonchi, wheezing or stridor.  Cardio: RRR with no MRGs. Brisk peripheral pulses without edema.  Abdomen: Soft, + BS.  Non tender, no guarding, rebound, hernias, masses. Lymphatics: Non tender without lymphadenopathy.  Musculoskeletal: Full ROM, 5/5 strength, Normal gait. Skin: Warm, dry without rashes, lesions, ecchymosis.  Neuro: Cranial nerves intact. No cerebellar symptoms.  Psych: Awake and oriented X 3, normal affect, Insight and Judgment appropriate.    Revonda Humphrey ANP-C  [facility]  03/05/2023

## 2023-03-11 ENCOUNTER — Ambulatory Visit: Payer: BC Managed Care – PPO | Admitting: Neurology

## 2023-03-11 DIAGNOSIS — G478 Other sleep disorders: Secondary | ICD-10-CM

## 2023-03-11 DIAGNOSIS — G720 Drug-induced myopathy: Secondary | ICD-10-CM

## 2023-03-11 DIAGNOSIS — G4733 Obstructive sleep apnea (adult) (pediatric): Secondary | ICD-10-CM

## 2023-03-11 DIAGNOSIS — G4700 Insomnia, unspecified: Secondary | ICD-10-CM

## 2023-03-12 NOTE — Progress Notes (Signed)
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 He was last seen by Dr Vickey Huger 06/2022 and is reporting to be unable to tolerate CPAP.  Referral was placed to dentistry for dental appliance. He was able to get an oral appliance through his family dentist.  He is wearing oral appliance ever night. He reports snoring is nearly resolved. He feels well rested. He denies excessive daytime sleepiness. He keeps CDL. Regular DOT physicals     Epworth sleepiness score: 3/24.   BMI: 31 kg/m   Neck Circumference: 18"   FINDINGS:   Sleep Summary: The patient is using a mouthguard doing this home sleep test.   Total Recording Time (hours, min): 8 hours 30 minutes  Total Sleep Time (hours, min):     7 hours 49 minutes            Percent REM (%):    11.6%                                    Respiratory Indices:   Calculated pAHI (per hour) by AASM criteria:    2.5/h                         REM pAHI:    1.1/h                                             NREM pAHI: 2.6/h                            Supine AHI: This patient slept 160 minutes in prone position associated with an AHI of 0.8/h followed by 130 minutes in supine position with an AHI of 4.6/h.  Snoring level reached a mean volume of 41 dB and was present for 66% of total sleep time.                                                 Oxygen Saturation Statistics:   O2 Saturation Range (%): Between the nadir of 91% of the maximum of 99% with a mean saturation of 96%                                      O2 Saturation (minutes) <89%:   0 minutes        Pulse Rate  Statistics:   Pulse Mean (bpm): 82 bpm               Pulse Range:    Between 63 and 132 bpm  IMPRESSION:  This HST does not detect clinically significant sleep apnea.  The overall AHI is now below 5 and would not be considered treated rarely by CPAP.  There are no prolonged hypoxic events, his obstructive sleep apnea is resolved under dental device use.   RECOMMENDATION: Continue the use of a dental device.    INTERPRETING PHYSICIAN:   Melvyn Novas, MD

## 2023-03-28 ENCOUNTER — Other Ambulatory Visit: Payer: Self-pay | Admitting: Internal Medicine

## 2023-03-28 DIAGNOSIS — I10 Essential (primary) hypertension: Secondary | ICD-10-CM

## 2023-03-31 NOTE — Procedures (Signed)
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 He was last seen by Dr Vickey Huger 06/2022 and is reporting to be unable to tolerate CPAP.  Referral was placed to dentistry for dental appliance. He was able to get an oral appliance through his family dentist.  He is wearing oral appliance ever night. He reports snoring is nearly resolved. He feels well rested. He denies excessive daytime sleepiness. He keeps CDL. Regular DOT physicals     Epworth sleepiness score: 3/24.   BMI: 31 kg/m   Neck Circumference: 18"   FINDINGS:   Sleep Summary: The patient is using a mouthguard doing this home sleep test.   Total Recording Time (hours, min): 8 hours 30 minutes  Total Sleep Time (hours, min):     7 hours 49 minutes            Percent REM (%):    11.6%                                    Respiratory Indices:   Calculated pAHI (per hour) by AASM criteria:    2.5/h                         REM pAHI:    1.1/h                                             NREM pAHI: 2.6/h                            Supine AHI: This patient slept 160 minutes in prone position associated with an AHI of 0.8/h followed by 130 minutes in supine position with an AHI of 4.6/h.  Snoring level reached a mean volume of 41 dB and was present for 66% of total sleep time.                                                 Oxygen Saturation Statistics:   O2 Saturation Range (%): Between the nadir of 91% of the maximum of 99% with a mean saturation of 96%                                      O2 Saturation (minutes) <89%:   0 minutes        Pulse Rate Statistics:    Pulse Mean (bpm): 82 bpm               Pulse Range:    Between 63 and 132 bpm  IMPRESSION:  This HST does not detect clinically significant sleep apnea.  The overall AHI is now below 5 and would not be considered treated rarely by CPAP.  There are no prolonged hypoxic events, his obstructive sleep apnea is resolved under dental device use.   RECOMMENDATION: Continue the use of a dental device.   Amy Lomax: Follow up in the sleep clinic would not be needed unless demanded by DOT.     INTERPRETING PHYSICIAN:   Melvyn Novas, MD

## 2023-04-01 ENCOUNTER — Encounter: Payer: BC Managed Care – PPO | Admitting: Internal Medicine

## 2023-04-24 ENCOUNTER — Other Ambulatory Visit: Payer: Self-pay | Admitting: Nurse Practitioner

## 2023-04-24 DIAGNOSIS — J3089 Other allergic rhinitis: Secondary | ICD-10-CM

## 2023-04-28 ENCOUNTER — Telehealth: Payer: BC Managed Care – PPO | Admitting: Physician Assistant

## 2023-04-28 DIAGNOSIS — J019 Acute sinusitis, unspecified: Secondary | ICD-10-CM | POA: Diagnosis not present

## 2023-04-28 DIAGNOSIS — B9689 Other specified bacterial agents as the cause of diseases classified elsewhere: Secondary | ICD-10-CM | POA: Diagnosis not present

## 2023-04-28 MED ORDER — AMOXICILLIN-POT CLAVULANATE 875-125 MG PO TABS
1.0000 | ORAL_TABLET | Freq: Two times a day (BID) | ORAL | 0 refills | Status: DC
Start: 2023-04-28 — End: 2023-05-29

## 2023-04-28 NOTE — Progress Notes (Signed)
Virtual Visit Consent   Matthew Salinas, you are scheduled for a virtual visit with a [facility] provider today. Just as with appointments in the office, your consent must be obtained to participate. Your consent will be active for this visit and any virtual visit you may have with one of our providers in the next 365 days. If you have a MyChart account, a copy of this consent can be sent to you electronically.  As this is a virtual visit, video technology does not allow for your provider to perform a traditional examination. This may limit your provider's ability to fully assess your condition. If your provider identifies any concerns that need to be evaluated in person or the need to arrange testing (such as labs, EKG, etc.), we will make arrangements to do so. Although advances in technology are sophisticated, we cannot ensure that it will always work on either your end or our end. If the connection with a video visit is poor, the visit may have to be switched to a telephone visit. With either a video or telephone visit, we are not always able to ensure that we have a secure connection.  By engaging in this virtual visit, you consent to the provision of healthcare and authorize for your insurance to be billed (if applicable) for the services provided during this visit. Depending on your insurance coverage, you may receive a charge related to this service.  I need to obtain your verbal consent now. Are you willing to proceed with your visit today? Matthew Salinas has provided verbal consent on 04/28/2023 for a virtual visit (video or telephone). Margaretann Loveless, PA-C  Date: 04/28/2023 5:37 PM  Virtual Visit via Video Note   I, Margaretann Loveless, connected with  Matthew Salinas  ([id number], [birth year]) on 04/28/23 at  5:30 PM EDT by a video-enabled telemedicine application and verified that I am speaking with the correct person using two identifiers.  Location: Patient: Virtual Visit  Location Patient: Home Provider: Virtual Visit Location Provider: Home Office   I discussed the limitations of evaluation and management by telemedicine and the availability of in person appointments. The patient expressed understanding and agreed to proceed.    History of Present Illness: Matthew Salinas is a 57 y.o. who identifies as a male who was assigned male at birth, and is being seen today for head congestion.  HPI: Sinusitis This is a new problem. The current episode started in the past 7 days. The problem has been gradually worsening since onset. There has been no fever. Associated symptoms include congestion, ear pain (full), headaches, sinus pressure and a sore throat (scratchy with post nasal drainage). Pertinent negatives include no chills, coughing, diaphoresis or swollen glands. (Noticing streaks of blood when blowing nose) Past treatments include oral decongestants and acetaminophen (tylenol sinus). The treatment provided no relief.      Problems:  Patient Active Problem List   Diagnosis Date Noted   Severe sleep apnea 06/05/2021   Snoring 05/07/2021   Choking 05/07/2021   Difficulty staying asleep 05/07/2021   Non-restorative sleep 05/07/2021   Tonsillar hypertrophy 05/07/2021   Statin myopathy 03/01/2020   Overweight (BMI 25.0-29.9) 08/17/2017   Snorings (without sleep apnea) 12/04/2015   Rhinitis due to pollen 12/04/2015   GERD (gastroesophageal reflux disease) 09/19/2015   Chronic anxiety 09/19/2015   Hx of pneumothorax 05/16/2014   Essential hypertension 09/19/2013   Hyperlipidemia, mixed 09/19/2013   Abnormal glucose 09/19/2013   Vitamin D deficiency 09/19/2013  Medication management 09/19/2013    Allergies:  Allergies  Allergen Reactions   Lipitor [Atorvastatin]     Myalgias   Prozac [Fluoxetine Hcl]    Rosuvastatin     myalgias   Simvastatin     Myalgias   Medications:  Current Outpatient Medications:    amoxicillin-clavulanate (AUGMENTIN)  875-125 MG tablet, Take 1 tablet by mouth 2 (two) times daily., Disp: 20 tablet, Rfl: 0   celecoxib (CELEBREX) 100 MG capsule, Take 1 tab q 12 hours as needed, Disp: 60 capsule, Rfl: 2   cholecalciferol (VITAMIN D) 1000 UNITS tablet, Take 5,000 Units by mouth daily. , Disp: , Rfl:    co-enzyme Q-10 30 MG capsule, Take 30 mg by mouth 3 (three) times daily., Disp: , Rfl:    famotidine (PEPCID) 20 MG tablet, Take  1 tablet  2 x /day to Prevent Heart burn & Indigestion                                                           /                                       TAKE                                            BY                                              MOUTH, Disp: 180 tablet, Rfl: 3   lidocaine-prilocaine (EMLA) cream, APPLY TOPICALLY AS NEEDED, Disp: 90 g, Rfl: 11   lisinopril-hydrochlorothiazide (ZESTORETIC) 20-25 MG tablet, Take 1/2 tablet Daily for BP                                                                  /                                                                   TAKE                                         BY                                                 MOUTH, Disp: 46 tablet, Rfl:  0   MELOXICAM PO, Take by mouth as needed., Disp: , Rfl:    montelukast (SINGULAIR) 10 MG tablet, TAKE 1 TABLET BY MOUTH ONCE DAILY FOR ALLERGIES AND FOR ASTHMA, Disp: 90 tablet, Rfl: 0   Multiple Vitamins-Minerals (MULTIVITAMIN PO), Take by mouth., Disp: , Rfl:    Multiple Vitamins-Minerals (ZINC PO), Take by mouth., Disp: , Rfl:    mupirocin ointment (BACTROBAN) 2 %, Apply 1 application. topically 2 (two) times daily., Disp: 22 g, Rfl: 0   nitroGLYCERIN (NITROGLYN) 2 % ointment, Apply   1/2 "  to Hemorrhoid  every 6 hours  as needed, Disp: 60 g, Rfl: 3   Omega-3 Fatty Acids (FISH OIL PO), Take by mouth., Disp: , Rfl:    omeprazole (PRILOSEC) 40 MG capsule, TAKE 1 CAPSULE BY MOUTH DAILY TO PREVENT HEARTBURN AND INDIGESTION, Disp: 90 capsule, Rfl: 1   PROCTO-MED HC 2.5 % rectal cream, APPLY  CREAM  RECTALLY 4 TIMES DAILY, Disp: 56 g, Rfl: 3   rosuvastatin (CRESTOR) 10 MG tablet, Take 10 mg by mouth daily., Disp: , Rfl:    rosuvastatin (CRESTOR) 20 MG tablet, TAKE 1 TABLET BY MOUTH ONCE DAILY FOR CHOLESTEROL (Patient not taking: Reported on 03/05/2023), Disp: 90 tablet, Rfl: 0   traZODone (DESYREL) 150 MG tablet, Take 1/2 to 1 tablet   1 to 2 hours before Bedtime as needed for Sleep  ( Replaces Xanax / Alprazolam ), Disp: 90 tablet, Rfl: 0  Observations/Objective: Patient is well-developed, well-nourished in no acute distress.  Resting comfortably at home.  Head is normocephalic, atraumatic.  No labored breathing.  Speech is clear and coherent with logical content.  Patient is alert and oriented at baseline.    Assessment and Plan: 1. Acute bacterial sinusitis - amoxicillin-clavulanate (AUGMENTIN) 875-125 MG tablet; Take 1 tablet by mouth 2 (two) times daily.  Dispense: 20 tablet; Refill: 0  - Worsening symptoms that have not responded to OTC medications.  - Will give Augmentin - Continue allergy medications.  - Steam and humidifier can help - Stay well hydrated and get plenty of rest.  - Seek in person evaluation if no symptom improvement or if symptoms worsen   Follow Up Instructions: I discussed the assessment and treatment plan with the patient. The patient was provided an opportunity to ask questions and all were answered. The patient agreed with the plan and demonstrated an understanding of the instructions.  A copy of instructions were sent to the patient via MyChart unless otherwise noted below.    The patient was advised to call back or seek an in-person evaluation if the symptoms worsen or if the condition fails to improve as anticipated.  Time:  I spent 10 minutes with the patient via telehealth technology discussing the above problems/concerns.    Margaretann Loveless, PA-C

## 2023-04-28 NOTE — Patient Instructions (Signed)
[name], thank you for joining Margaretann Loveless, PA-C for today's virtual visit.  While this provider is not your primary care provider (PCP), if your PCP is located in our provider database this encounter information will be shared with them immediately following your visit.   A [facility] MyChart account gives you access to today's visit and all your visits, tests, and labs performed at [facility] " click here if you don't have a [facility] MyChart account or go to [email or web address]  Consent: (Patient) MARWIN COTE provided verbal consent for this virtual visit at the beginning of the encounter.  Current Medications:  Current Outpatient Medications:    amoxicillin-clavulanate (AUGMENTIN) 875-125 MG tablet, Take 1 tablet by mouth 2 (two) times daily., Disp: 20 tablet, Rfl: 0   celecoxib (CELEBREX) 100 MG capsule, Take 1 tab q 12 hours as needed, Disp: 60 capsule, Rfl: 2   cholecalciferol (VITAMIN D) 1000 UNITS tablet, Take 5,000 Units by mouth daily. , Disp: , Rfl:    co-enzyme Q-10 30 MG capsule, Take 30 mg by mouth 3 (three) times daily., Disp: , Rfl:    famotidine (PEPCID) 20 MG tablet, Take  1 tablet  2 x /day to Prevent Heart burn & Indigestion                                                           /                                       TAKE                                            BY                                              MOUTH, Disp: 180 tablet, Rfl: 3   lidocaine-prilocaine (EMLA) cream, APPLY TOPICALLY AS NEEDED, Disp: 90 g, Rfl: 11   lisinopril-hydrochlorothiazide (ZESTORETIC) 20-25 MG tablet, Take 1/2 tablet Daily for BP                                                                  /                                                                   TAKE  BY                                                 MOUTH, Disp: 46 tablet, Rfl: 0   MELOXICAM PO, Take by mouth as needed., Disp: , Rfl:     montelukast (SINGULAIR) 10 MG tablet, TAKE 1 TABLET BY MOUTH ONCE DAILY FOR ALLERGIES AND FOR ASTHMA, Disp: 90 tablet, Rfl: 0   Multiple Vitamins-Minerals (MULTIVITAMIN PO), Take by mouth., Disp: , Rfl:    Multiple Vitamins-Minerals (ZINC PO), Take by mouth., Disp: , Rfl:    mupirocin ointment (BACTROBAN) 2 %, Apply 1 application. topically 2 (two) times daily., Disp: 22 g, Rfl: 0   nitroGLYCERIN (NITROGLYN) 2 % ointment, Apply   1/2 "  to Hemorrhoid  every 6 hours  as needed, Disp: 60 g, Rfl: 3   Omega-3 Fatty Acids (FISH OIL PO), Take by mouth., Disp: , Rfl:    omeprazole (PRILOSEC) 40 MG capsule, TAKE 1 CAPSULE BY MOUTH DAILY TO PREVENT HEARTBURN AND INDIGESTION, Disp: 90 capsule, Rfl: 1   PROCTO-MED HC 2.5 % rectal cream, APPLY CREAM  RECTALLY 4 TIMES DAILY, Disp: 56 g, Rfl: 3   rosuvastatin (CRESTOR) 10 MG tablet, Take 10 mg by mouth daily., Disp: , Rfl:    rosuvastatin (CRESTOR) 20 MG tablet, TAKE 1 TABLET BY MOUTH ONCE DAILY FOR CHOLESTEROL (Patient not taking: Reported on 03/05/2023), Disp: 90 tablet, Rfl: 0   traZODone (DESYREL) 150 MG tablet, Take 1/2 to 1 tablet   1 to 2 hours before Bedtime as needed for Sleep  ( Replaces Xanax / Alprazolam ), Disp: 90 tablet, Rfl: 0   Medications ordered in this encounter:  Meds ordered this encounter  Medications   amoxicillin-clavulanate (AUGMENTIN) 875-125 MG tablet    Sig: Take 1 tablet by mouth 2 (two) times daily.    Dispense:  20 tablet    Refill:  0    Order Specific Question:   Supervising Provider    Answer:   Merrilee Jansky [id number]     *If you need refills on other medications prior to your next appointment, please contact your pharmacy*  Follow-Up: Call back or seek an in-person evaluation if the symptoms worsen or if the condition fails to improve as anticipated.  [facility] [phone]  Other Instructions Sinus Infection, Adult A sinus infection, also called sinusitis, is inflammation of your sinuses.  Sinuses are hollow spaces in the bones around your face. Your sinuses are located: Around your eyes. In the middle of your forehead. Behind your nose. In your cheekbones. Mucus normally drains out of your sinuses. When your nasal tissues become inflamed or swollen, mucus can become trapped or blocked. This allows bacteria, viruses, and fungi to grow, which leads to infection. Most infections of the sinuses are caused by a virus. A sinus infection can develop quickly. It can last for up to 4 weeks (acute) or for more than 12 weeks (chronic). A sinus infection often develops after a cold. What are the causes? This condition is caused by anything that creates swelling in the sinuses or stops mucus from draining. This includes: Allergies. Asthma. Infection from bacteria or viruses. Deformities or blockages in your nose or sinuses. Abnormal growths in the nose (nasal polyps). Pollutants, such as chemicals or irritants in the air. Infection from fungi. This is rare.  What increases the risk? You are more likely to develop this condition if you: Have a weak body defense system (immune system). Do a lot of swimming or diving. Overuse nasal sprays. Smoke. What are the signs or symptoms? The main symptoms of this condition are pain and a feeling of pressure around the affected sinuses. Other symptoms include: Stuffy nose or congestion that makes it difficult to breathe through your nose. Thick yellow or greenish drainage from your nose. Tenderness, swelling, and warmth over the affected sinuses. A cough that may get worse at night. Decreased sense of smell and taste. Extra mucus that collects in the throat or the back of the nose (postnasal drip) causing a sore throat or bad breath. Tiredness (fatigue). Fever. How is this diagnosed? This condition is diagnosed based on: Your symptoms. Your medical history. A physical exam. Tests to find out if your condition is acute or chronic. This may  include: Checking your nose for nasal polyps. Viewing your sinuses using a device that has a light (endoscope). Testing for allergies or bacteria. Imaging tests, such as an MRI or CT scan. In rare cases, a bone biopsy may be done to rule out more serious types of fungal sinus disease. How is this treated? Treatment for a sinus infection depends on the cause and whether your condition is chronic or acute. If caused by a virus, your symptoms should go away on their own within 10 days. You may be given medicines to relieve symptoms. They include: Medicines that shrink swollen nasal passages (decongestants). A spray that eases inflammation of the nostrils (topical intranasal corticosteroids). Rinses that help get rid of thick mucus in your nose (nasal saline washes). Medicines that treat allergies (antihistamines). Over-the-counter pain relievers. If caused by bacteria, your health care provider may recommend waiting to see if your symptoms improve. Most bacterial infections will get better without antibiotic medicine. You may be given antibiotics if you have: A severe infection. A weak immune system. If caused by narrow nasal passages or nasal polyps, surgery may be needed. Follow these instructions at home: Medicines Take, use, or apply over-the-counter and prescription medicines only as told by your health care provider. These may include nasal sprays. If you were prescribed an antibiotic medicine, take it as told by your health care provider. Do not stop taking the antibiotic even if you start to feel better. Hydrate and humidify  Drink enough fluid to keep your urine pale yellow. Staying hydrated will help to thin your mucus. Use a cool mist humidifier to keep the humidity level in your home above 50%. Inhale steam for 10-15 minutes, 3-4 times a day, or as told by your health care provider. You can do this in the bathroom while a hot shower is running. Limit your exposure to cool or dry  air. Rest Rest as much as possible. Sleep with your head raised (elevated). Make sure you get enough sleep each night. General instructions  Apply a warm, moist washcloth to your face 3-4 times a day or as told by your health care provider. This will help with discomfort. Use nasal saline washes as often as told by your health care provider. Wash your hands often with soap and water to reduce your exposure to germs. If soap and water are not available, use hand sanitizer. Do not smoke. Avoid being around people who are smoking (secondhand smoke). Keep all follow-up visits. This is important. Contact a health care provider if: You have a fever. Your symptoms get worse.  Your symptoms do not improve within 10 days. Get help right away if: You have a severe headache. You have persistent vomiting. You have severe pain or swelling around your face or eyes. You have vision problems. You develop confusion. Your neck is stiff. You have trouble breathing. These symptoms may be an emergency. Get help right away. Call 911. Do not wait to see if the symptoms will go away. Do not drive yourself to the hospital. Summary A sinus infection is soreness and inflammation of your sinuses. Sinuses are hollow spaces in the bones around your face. This condition is caused by nasal tissues that become inflamed or swollen. The swelling traps or blocks the flow of mucus. This allows bacteria, viruses, and fungi to grow, which leads to infection. If you were prescribed an antibiotic medicine, take it as told by your health care provider. Do not stop taking the antibiotic even if you start to feel better. Keep all follow-up visits. This is important. This information is not intended to replace advice given to you by your health care provider. Make sure you discuss any questions you have with your health care provider. Document Revised: 06/26/2021 Document Reviewed: 06/26/2021 Elsevier Patient Education  2024  [employer]    If you have been instructed to have an in-person evaluation today at a local Urgent Care facility, please use the link below. It will take you to a list of all of our available [facility], including address, phone number and hours of operation. Please do not delay care.  [facility]  If you or a family member do not have a primary care provider, use the link below to schedule a visit and establish care. When you choose a [facility] primary care physician or advanced practice provider, you gain a long-term partner in health. Find a Primary Care Provider  Learn more about [facility] in-office and virtual care options: [facility] Get Care Now

## 2023-05-06 ENCOUNTER — Encounter: Payer: BC Managed Care – PPO | Admitting: Internal Medicine

## 2023-05-08 ENCOUNTER — Encounter: Payer: Self-pay | Admitting: Internal Medicine

## 2023-05-29 ENCOUNTER — Ambulatory Visit: Payer: BC Managed Care – PPO | Admitting: Neurosurgery

## 2023-05-29 ENCOUNTER — Encounter: Payer: Self-pay | Admitting: Neurosurgery

## 2023-05-29 VITALS — BP 142/90 | Ht 71.0 in | Wt 223.0 lb

## 2023-05-29 DIAGNOSIS — M545 Low back pain, unspecified: Secondary | ICD-10-CM | POA: Diagnosis not present

## 2023-05-29 DIAGNOSIS — R200 Anesthesia of skin: Secondary | ICD-10-CM | POA: Diagnosis not present

## 2023-05-29 DIAGNOSIS — M48062 Spinal stenosis, lumbar region with neurogenic claudication: Secondary | ICD-10-CM

## 2023-05-29 DIAGNOSIS — G8929 Other chronic pain: Secondary | ICD-10-CM

## 2023-05-29 MED ORDER — METHOCARBAMOL 750 MG PO TABS: 750.0000 mg | ORAL_TABLET | Freq: Three times a day (TID) | ORAL | 0 refills | Status: DC | PRN

## 2023-05-29 MED ORDER — METHYLPREDNISOLONE 4 MG PO TBPK
ORAL_TABLET | ORAL | 0 refills | Status: DC
Start: 2023-05-29 — End: 2023-06-19

## 2023-05-29 NOTE — Progress Notes (Signed)
Follow-up note: Referring Physician:  Lucky Cowboy, MD [address] [place],  [place] [address]  Primary Physician:  Lucky Cowboy, MD  Chief Complaint:  persistent back pain  History of Present Illness: Matthew Salinas is a 57 y.o. male who presents with the chief complaint of ongoing back pain.  He reports he was doing relatively well until Monday of this week when he was moving his camper and felt a pop in his back and worsening mid and lower back pain with numbness down his right leg.  He denies any focal weakness, bowel or bladder dysfunction.  He has continued the exercises that were given to him in physical therapy and has taken anti-inflammatories.  01/14/23 Matthew Salinas is a 57 year old presenting today via telephone visit.  Overall he reports improvement of his back and buttock pain with physical therapy.  He states he has 1 more session this week.  He feels that his current symptoms are manageable.   12/17/2022 Matthew Salinas is a 57 year old with a history of anxiety, GERD, hypertension, CAD, and hyperlipidemia who is here today with a chief complaint of 6+ months of low back pain with intermittent radiating leg pain.  He states that this started without any particular inciting event and has worsened over the last couple of months.  He describes pain that starts in his lower back and radiates into his buttocks down the posterior lateral aspect of his legs and into his ankle.  Describes his pain as a burning/stinging sensation with associated numbness.  This occurs on both the left and right side interchangeably.  He states it is worse with activity particularly with bending to do things such as gardening.  He has been taking Tylenol, Flexeril, and NSAIDs with minimal relief.  He did see his primary care provider who ordered an MRI of his lumbar spine and prescribed Decadron which she has not taken yet.  He denies any significant lower extremity  weakness.  Review of Systems:  A 10 point review of systems is negative,  and the pertinent positives and negatives detailed in the HPI.  Past Medical History: Past Medical History:  Diagnosis Date   Allergy    GERD (gastroesophageal reflux disease)    Hyperlipidemia    Hypertension    IBS (irritable bowel syndrome)    Traumatic pneumothorax 12/04/2015   Vitamin D deficiency     Past Surgical History: Past Surgical History:  Procedure Laterality Date   CHEST TUBE INSERTION Right 05-15-02   thoracostomy   ESOPHAGOGASTRODUODENOSCOPY (EGD) WITH PROPOFOL N/A 12/15/2020   Procedure: ESOPHAGOGASTRODUODENOSCOPY (EGD) WITH PROPOFOL;  Surgeon: Earline Mayotte, MD;  Location: [facility] ENDOSCOPY;  Service: Endoscopy;  Laterality: N/A;  with possible dilation   TOE SURGERY  2008   right big toe    Allergies: Allergies as of 05/29/2023 - Review Complete 04/28/2023  Allergen Reaction Noted   Lipitor [atorvastatin]  05/16/2014   Prozac [fluoxetine hcl]  07/13/2013   Rosuvastatin  01/25/2019   Simvastatin  05/16/2014    Medications: Outpatient Encounter Medications as of 05/29/2023  Medication Sig   amoxicillin-clavulanate (AUGMENTIN) 875-125 MG tablet Take 1 tablet by mouth 2 (two) times daily.   celecoxib (CELEBREX) 100 MG capsule Take 1 tab q 12 hours as needed   cholecalciferol (VITAMIN D) 1000 UNITS tablet Take 5,000 Units by mouth daily.    co-enzyme Q-10 30 MG capsule Take 30 mg by mouth 3 (three) times daily.   famotidine (PEPCID) 20 MG tablet Take  1 tablet  2 x /day to Prevent Heart burn & Indigestion                                                           /                                       TAKE                                            BY                                              MOUTH   lidocaine-prilocaine (EMLA) cream APPLY TOPICALLY AS NEEDED   lisinopril-hydrochlorothiazide (ZESTORETIC) 20-25 MG tablet Take 1/2 tablet Daily for BP                                                                   /                                                                   TAKE                                         BY                                                 MOUTH   MELOXICAM PO Take by mouth as needed.   montelukast (SINGULAIR) 10 MG tablet TAKE 1 TABLET BY MOUTH ONCE DAILY FOR ALLERGIES AND FOR ASTHMA   Multiple Vitamins-Minerals (MULTIVITAMIN PO) Take by mouth.   Multiple Vitamins-Minerals (ZINC PO) Take by mouth.   mupirocin ointment (BACTROBAN) 2 % Apply 1 application. topically 2 (two) times daily.   nitroGLYCERIN (NITROGLYN) 2 % ointment Apply   1/2 "  to Hemorrhoid  every 6 hours  as needed   Omega-3 Fatty Acids (FISH OIL PO) Take by mouth.   omeprazole (PRILOSEC) 40 MG capsule TAKE 1 CAPSULE BY MOUTH DAILY TO PREVENT HEARTBURN AND INDIGESTION   PROCTO-MED HC 2.5 % rectal cream APPLY CREAM  RECTALLY 4 TIMES DAILY   rosuvastatin (CRESTOR) 10 MG tablet Take 10 mg by mouth daily.   rosuvastatin (CRESTOR) 20 MG tablet TAKE 1 TABLET BY MOUTH ONCE DAILY  FOR CHOLESTEROL (Patient not taking: Reported on 03/05/2023)   traZODone (DESYREL) 150 MG tablet Take 1/2 to 1 tablet   1 to 2 hours before Bedtime as needed for Sleep  ( Replaces Xanax / Alprazolam )   No facility-administered encounter medications on file as of 05/29/2023.    Social History: Social History   Tobacco Use   Smoking status: Former    Current packs/day: 0.00    Types: Cigarettes    Quit date: 08/05/2001    Years since quitting: 21.8   Smokeless tobacco: Never   Tobacco comments:    Smoked 5-10 cigarettes per day since age 73  Substance Use Topics   Alcohol use: Yes    Alcohol/week: 10.0 standard drinks of alcohol    Types: 10 Standard drinks or equivalent per week   Drug use: No    Family Medical History: Family History  Problem Relation Age of Onset   Cancer Mother        lung   Hyperlipidemia Mother    Hypertension Father    Hyperlipidemia Father    Dementia Brother     Hyperlipidemia Brother    Hyperlipidemia Brother    Hyperlipidemia Brother    Colon cancer Maternal Grandmother    Diabetes Maternal Grandfather     Exam: Vitals:   05/29/23 1121  BP: (!) 142/90   5/5 throughout BLE Sensation intact throughout bilateral lower extremities. 2+ patellar and Achilles reflexes bilaterally.  Imaging: MRI L spine 01/10/23 Narrative & Impression  CLINICAL DATA:  Pain in low back, groin bilaterally, and bilateral posterior thighs   EXAM: MRI LUMBAR SPINE WITHOUT CONTRAST   TECHNIQUE: Multiplanar, multisequence MR imaging of the lumbar spine was performed. No intravenous contrast was administered.   COMPARISON:  None Available.   FINDINGS: Segmentation:  5 lumbar type vertebral bodies.   Alignment: Dextrocurvature. Trace anterolisthesis of L3 on L4 and trace retrolisthesis of L4 on L5 and L5 on S1.   Vertebrae: No acute fracture, evidence of discitis, or suspicious osseous lesion. Congenitally short pedicles, which narrow the AP diameter of the spinal canal.   Conus medullaris and cauda equina: Conus extends to the T12-L1 level. Conus and cauda equina appear normal.   Paraspinal and other soft tissues: No acute finding. 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   Disc levels:   T12-L1: No significant disc bulge. No spinal canal stenosis or neural foraminal narrowing.   L1-L2: No significant disc bulge. No spinal canal stenosis or neural foraminal narrowing.   L2-L3: No significant disc bulge. Mild facet arthropathy. Mild spinal canal stenosis. Mild left neural foraminal narrowing.   L3-L4: Trace anterolisthesis and disc unroofing with mild disc bulge, which may contact the exiting L3 nerves. Moderate to severe facet arthropathy. Ligamentum flavum hypertrophy. Moderate spinal canal stenosis. Effacement of the lateral recesses. Mild-to-moderate left and mild right neural foraminal narrowing.   L4-L5: Mild disc bulge with superimposed  left paracentral and subarticular protrusion, which effaces the left lateral recess and narrows the right lateral recess. Mild facet arthropathy. Mild-to-moderate spinal canal stenosis. No neural foraminal narrowing.   L5-S1: Central disc protrusion with small annular fissure. No spinal canal stenosis or neural foraminal narrowing.   IMPRESSION: 1. L3-L4 moderate spinal canal stenosis with mild-to-moderate left and mild right neural foraminal narrowing. Effacement of the lateral recesses at this level likely compresses the descending L4 nerve roots. In addition, the disc bulge at this level may contact the exiting L3 nerves. 2. L4-L5 mild-to-moderate spinal canal  stenosis. Effacement of the left lateral recess likely compresses the descending left L5 nerve roots; narrowing of the right lateral recess at this level could also affect the descending right L5 nerve roots. 3. L2-L3 mild spinal canal stenosis and mild left neural foraminal narrowing.     Electronically Signed   By: Wiliam Ke M.D.   On: 12/14/2022 18:38    I have personally reviewed the images and agree with the above interpretation.  Assessment and Plan: Matthew Salinas is a pleasant 57 y.o. male with acute on chronic lumbosacral pain with a new onset of right leg numbness since Monday.  He does not have any neurologic deficits on exam I therefore recommended conservative management with a steroid taper and Robaxin.  We discussed medication side effects.  He was instructed not to take any NSAIDs while on the steroid taper.  He was encouraged to call the office should he have any new or worsening symptoms.  I will otherwise see him via telephone visit next week.  Should his symptoms persist we discussed obtaining an MRI to evaluate for any acute changes.  He expressed understanding and was in agreement with this plan.  I spent a total of 30 minutes in both face-to-face and non-face-to-face activities for this visit on the  date of this encounter including review of previous imaging, review of records, discussion of symptoms, discussion of differential diagnosis, discussion of treatment options, discussion of medication side effects, physical exam, and documentation  Manning Charity PA-C Neurosurgery

## 2023-06-05 ENCOUNTER — Ambulatory Visit: Payer: BC Managed Care – PPO | Admitting: Neurosurgery

## 2023-06-05 DIAGNOSIS — M5416 Radiculopathy, lumbar region: Secondary | ICD-10-CM

## 2023-06-05 DIAGNOSIS — M48062 Spinal stenosis, lumbar region with neurogenic claudication: Secondary | ICD-10-CM

## 2023-06-05 DIAGNOSIS — M48061 Spinal stenosis, lumbar region without neurogenic claudication: Secondary | ICD-10-CM

## 2023-06-05 DIAGNOSIS — R531 Weakness: Secondary | ICD-10-CM

## 2023-06-05 NOTE — Progress Notes (Signed)
Neurosurgery Telephone (Audio-Only) Note  Requesting Provider     Lucky Cowboy, MD [address] [place],  [place] [address] T: [phone] F: [phone]  Primary Care Provider Lucky Cowboy, MD [address] [place] [address] T: [phone] F: [phone]  Telehealth visit was conducted with Matthew Salinas, a 57 y.o. male via telephone.   History of Present Illness: Matthew Salinas is a 57 year old presenting today via telephone visit to review his progress after Medrol Dosepak and Robaxin.  He states that he had some mild improvement for 2 or 3 days when starting his Medrol Dosepak but that his symptoms have returned.  He states he feels as if he is worse than he was prior to his initial evaluation.  He now feels as if his leg is weak and he has having episodes of feeling as though his leg is giving out on him.  05/29/23 Matthew Salinas is a 57 y.o. male who presents with the chief complaint of ongoing back pain.  He reports he was doing relatively well until Monday of this week when he was moving his camper and felt a pop in his back and worsening mid and lower back pain with numbness down his right leg.  He denies any focal weakness, bowel or bladder dysfunction.  He has continued the exercises that were given to him in physical therapy and has taken anti-inflammatories.   01/14/23 Matthew Salinas is a 57 year old presenting today via telephone visit.  Overall he reports improvement of his back and buttock pain with physical therapy.  He states he has 1 more session this week.  He feels that his current symptoms are manageable.   12/17/2022 Matthew Salinas is a 57 year old with a history of anxiety, GERD, hypertension, CAD, and hyperlipidemia who is here today with a chief complaint of 6+ months of low back pain with intermittent radiating leg pain.  He states that this started without any particular inciting event and has worsened over  the last couple of months.  He describes pain that starts in his lower back and radiates into his buttocks down the posterior lateral aspect of his legs and into his ankle.  Describes his pain as a burning/stinging sensation with associated numbness.  This occurs on both the left and right side interchangeably.  He states it is worse with activity particularly with bending to do things such as gardening.  He has been taking Tylenol, Flexeril, and NSAIDs with minimal relief.  He did see his primary care provider who ordered an MRI of his lumbar spine and prescribed Decadron which she has not taken yet.  He denies any significant lower extremity weakness.  General Review of Systems:  A ROS was performed including pertinent positive and negatives as documented.  All other systems are negative.  @ALLERGYCOLLAPSE @   Prior to Admission medications   Medication Sig Start Date End Date Taking? Authorizing Provider  celecoxib (CELEBREX) 100 MG capsule Take 1 tab q 12 hours as needed 11/25/22   Raynelle Dick, NP  cholecalciferol (VITAMIN D) 1000 UNITS tablet Take 5,000 Units by mouth daily.     [provider]  co-enzyme Q-10 30 MG capsule Take 30 mg by mouth 3 (three) times daily.    [provider]  famotidine (PEPCID) 20 MG tablet Take  1 tablet  2 x /day to Prevent Heart burn & Indigestion                                                           /  TAKE                                            BY                                              MOUTH 11/25/22   Lucky Cowboy, MD  lidocaine-prilocaine (EMLA) cream APPLY TOPICALLY AS NEEDED 01/15/18   Lucky Cowboy, MD  lisinopril-hydrochlorothiazide (ZESTORETIC) 20-25 MG tablet Take 1/2 tablet Daily for BP                                                                  /                                                                   TAKE                                         BY                                                  MOUTH 03/29/23   Lucky Cowboy, MD  methocarbamol (ROBAXIN-750) 750 MG tablet Take 1 tablet (750 mg total) by mouth 3 (three) times daily as needed for muscle spasms. 05/29/23   Susanne Borders, PA  methylPREDNISolone (MEDROL DOSEPAK) 4 MG TBPK tablet Take as directed on box 05/29/23   Susanne Borders, PA  montelukast (SINGULAIR) 10 MG tablet TAKE 1 TABLET BY MOUTH ONCE DAILY FOR ALLERGIES AND FOR ASTHMA 04/24/23   Raynelle Dick, NP  Multiple Vitamins-Minerals (MULTIVITAMIN PO) Take by mouth.    [provider]  mupirocin ointment (BACTROBAN) 2 % Apply 1 application. topically 2 (two) times daily. 11/21/21   Raynelle Dick, NP  nitroGLYCERIN (NITROGLYN) 2 % ointment Apply   1/2 "  to Hemorrhoid  every 6 hours  as needed 05/02/22   Lucky Cowboy, MD  Omega-3 Fatty Acids (FISH OIL PO) Take by mouth.    [provider]  omeprazole (PRILOSEC) 40 MG capsule TAKE 1 CAPSULE BY MOUTH DAILY TO PREVENT HEARTBURN AND INDIGESTION 07/22/22   Adela Glimpse, NP  PROCTO-MED HC 2.5 % rectal cream APPLY CREAM  RECTALLY 4 TIMES DAILY 07/04/20   Lucky Cowboy, MD  rosuvastatin (CRESTOR) 10 MG tablet Take 10 mg by mouth daily.    [provider]  rosuvastatin (CRESTOR) 20 MG tablet TAKE 1 TABLET BY MOUTH ONCE DAILY FOR CHOLESTEROL 02/14/23   Raynelle Dick, NP    DATA REVIEWED    Imaging Studies  MRI L spine 12/10/22 IMPRESSION: 1. L3-L4 moderate spinal canal stenosis with mild-to-moderate left and mild right neural foraminal narrowing. Effacement of the lateral recesses at this level likely compresses the descending L4 nerve roots. In addition, the disc bulge at this level may contact the exiting L3 nerves. 2. L4-L5 mild-to-moderate spinal canal stenosis. Effacement of the left lateral recess likely compresses the descending left L5 nerve roots; narrowing of the right lateral recess at this level could also affect the descending  right L5 nerve roots. 3. L2-L3 mild spinal canal stenosis and mild left neural foraminal narrowing.     Electronically Signed   By: Wiliam Ke M.D.   On: 12/14/2022 18:38    IMPRESSION  Matthew Salinas is a 57 y.o. male who I performed a telephone encounter today for evaluation and management of lumbar stenosis with acute worsening of back and radiating leg pain.  PLAN  Mr. Schoepp is a pleasant 57 year old with a history of symptomatic lumbar stenosis which improved with conservative management however his symptoms recurred after moving a camper a couple of weeks ago and he has had persistent symptoms since despite time and medication management.  We discussed further conservative management however he does not feel that he can participate in any additional physical therapy due to the severity of his pain.  Given the changing in the characteristics of his pain and the acute onset and mechanism, I recommended an updated MRI to evaluate for any changes however his known lumbar stenosis certainly could be the cause of his current symptoms.  I will contact him with the results of the MRI to discuss further plan of care.  He was cautioned regarding red flag symptoms.  He would like his lumbar MRI done at the [facility] facility in [place] as this is convenient for him.  No orders of the defined types were placed in this encounter.    DISPOSITION  Follow up: via telephone visit after MRI   Susanne Borders, PA Neurosurgery  TELEPHONE DOCUMENTATION   This visit was performed via telephone.  Patient location: home Provider location: office  I spent a total of 10 minutes non-face-to-face activities for this visit on the date of this encounter including review of current clinical condition and response to treatment.  The patient is aware of and accepts the limits of this telehealth visit.

## 2023-06-19 ENCOUNTER — Encounter: Payer: Self-pay | Admitting: Internal Medicine

## 2023-06-19 NOTE — Patient Instructions (Signed)

## 2023-06-19 NOTE — Progress Notes (Signed)
Annual  Screening/Preventative Visit  & Comprehensive Evaluation & Examination   Future Appointments  Date Time Provider Department  06/20/2023 10:00 AM Lucky Cowboy, MD [facility]  06/22/2023 11:00 AM [facility]  07/07/2024 10:00 AM Lucky Cowboy, MD [facility]            This very nice 57 y.o. [name] presents for a Screening /Preventative Visit & comprehensive evaluation and management of multiple medical co-morbidities.  Patient has been followed for HTN, HLD, Prediabetes and Vitamin D Deficiency.  Patient has Lumbar Spinal Stenosis with Rt sciatica with Lumbar MRI scheduled for Nov 17        HTN predates since 2005. Patient's BP has been controlled at home.  Today's BP is at goal - 138/80. Patient denies any cardiac symptoms as chest pain, palpitations, shortness of breath, dizziness or ankle swelling.        Patient's hyperlipidemia is controlled with diet and rosuvastatin. Patient denies myalgias or other medication SE's. Last lipids were not at goal:  Lab Results  Component Value Date   CHOL 205 (H) 03/05/2023   HDL 56 03/05/2023   LDLCALC 107 (H) 03/05/2023   TRIG 315 (H) 03/05/2023   CHOLHDL 3.7 03/05/2023         Patient is followed expectantly for glucose intolerance  and patient denies reactive hypoglycemic symptoms, visual blurring, diabetic polys or paresthesias. Last A1c was normal & at goal:   Lab Results  Component Value Date   HGBA1C 5.5 11/20/2022         Finally, patient has history of Vitamin D Deficiency ("34" /2016) and last vitamin D was at goal:   Lab Results  Component Value Date   VD25OH 54 11/20/2022       Current Outpatient Medications  Medication Instructions   celecoxib  100 MG capsule Take 1 tab q 12 hours as needed   VITAMIN D 5,000 Units  Daily   co-enzyme Q-10   30 mg  3 times daily   famotidine  20 MG tablet Take  1 tablet  2 x /day    lidocaine-prilocaine (EMLA) cream APPLY TOPICALLY AS NEEDED    lisinopril-hctz 20-25 MG tablet Take 1/2 tablet Daily    methocarbamol -750 mg 3 times daily PRN   montelukast  10 MG tablet TAKE 1 TABLET DAILY    Multiple Vitamins-Minerals  Take by mouth.   nitroGLYCERIN 2 % ointment Apply   1/2 "  to Hemorrhoid  every 6 hrs  as needed   Omega-3   FISH OIL  Daily   PROCTO-MED HC 2.5 % rectal crm APPLY CREAM  RECTALLY 4 TIMES DAILY   rosuvastatin  20 MG tablet TAKE 1 TABLET DAILY      Allergies  Allergen Reactions   Lipitor Myalgias   Prozac  Myalgias   Rosuvastatin Myalgias   Simvastatin Myalgias    Past Medical History:  Diagnosis Date   Allergy    GERD (gastroesophageal reflux disease)    Hyperlipidemia    Hypertension    IBS (irritable bowel syndrome)    Traumatic pneumothorax 12/04/2015   Vitamin D deficiency     Health Maintenance  Topic Date Due   COVID-19 Vaccine (1) Never done   Pneumococcal Vaccine  Never done   Zoster Vaccines- Shingrix (1 of 2) Never done   INFLUENZA VACCINE  03/05/2021   COLONOSCOPY  06/27/2025   TETANUS/TDAP  03/27/2026   Hepatitis C Screening  Completed   HIV Screening  Completed   HPV VACCINES  Aged Out     Immunization History  Administered Date(s) Administered   PPD Test 04/28/2017, 06/29/2018, 10/28/2019   Pneumococcal-23 01/06/2012   Td 06/10/2006   Tdap 03/27/2016    Last Colon - 06/28/2015 - Dr Lavon Paganini - Recc 10 year f/u due Dec 2026   Past Surgical History:  Procedure Laterality Date   CHEST TUBE INSERTION Right 05-15-02   thoracostomy   ESOPHAGOGASTRODUODENOSCOPY (EGD)  N/A 12/15/2020   ESOPHAGOGASTRODUODENOSCOPY (EGD) ;  Surgeon: Earline Mayotte, MD   TOE SURGERY  2008   right big toe     Family History  Problem Relation Age of Onset   Cancer Mother        lung   Hyperlipidemia Mother    Hypertension Father    Hyperlipidemia Father    Dementia Brother    Hyperlipidemia Brother    Hyperlipidemia Brother    Hyperlipidemia Brother    Colon cancer Maternal  Grandmother    Diabetes Maternal Grandfather     Social History   Socioeconomic History   Marital status: Divorced   Number of children: Not on file  Occupational History   Not on file  Tobacco Use   Smoking status: Former    Pack years: 0.00    Types: Cigarettes    Quit date: 08/05/2001    Years since quitting: 19.4   Smokeless tobacco: Never   Tobacco comments:    Smoked 5-10 cigarettes per day since age 66  Substance and Sexual Activity   Alcohol use: Yes    Alcohol/week: 10.0 standard drinks    Types: 10 Standard drinks or equivalent per week   Drug use: No   Sexual activity: Not on file    ROS Constitutional: Denies fever, chills, weight loss/gain, headaches, insomnia,  night sweats or change in appetite. Does c/o fatigue. Eyes: Denies redness, blurred vision, diplopia, discharge, itchy or watery eyes.  ENT: Denies discharge, congestion, post nasal drip, epistaxis, sore throat, earache, hearing loss, dental pain, Tinnitus, Vertigo, Sinus pain or snoring.  Cardio: Denies chest pain, palpitations, irregular heartbeat, syncope, dyspnea, diaphoresis, orthopnea, PND, claudication or edema Respiratory: denies cough, dyspnea, DOE, pleurisy, hoarseness, laryngitis or wheezing.  Gastrointestinal: Denies dysphagia, heartburn, reflux, water brash, pain, cramps, nausea, vomiting, bloating, diarrhea, constipation, hematemesis, melena, hematochezia, jaundice or hemorrhoids Genitourinary: Denies dysuria, frequency, urgency, nocturia, hesitancy, discharge, hematuria or flank pain Musculoskeletal: Denies arthralgia, myalgia, stiffness, Jt. Swelling, pain, limp or strain/sprain. Denies Falls. Skin: Denies puritis, rash, hives, warts, acne, eczema or change in skin lesion Neuro: No weakness, tremor, incoordination, spasms, paresthesia or pain Psychiatric: Denies confusion, memory loss or sensory loss. Denies Depression. Endocrine: Denies change in weight, skin, hair change, nocturia, and  paresthesia, diabetic polys, visual blurring or hyper / hypo glycemic episodes.  Heme/Lymph: No excessive bleeding, bruising or enlarged lymph nodes.   Physical Exam  BP 138/80   Pulse 89   Temp (!) 97.4 F (36.3 C)   Resp 16   Ht 5\' 11"  (1.803 m)   Wt 229 lb 9.6 oz (104.1 kg)   SpO2 98%   BMI 32.02 kg/m   General Appearance: Well nourished and well groomed and in no apparent distress.  Eyes: PERRLA, EOMs, conjunctiva no swelling or erythema, normal fundi and vessels. Sinuses: No frontal/maxillary tenderness ENT/Mouth: EACs patent / TMs  nl. Nares clear without erythema, swelling, mucoid exudates. Oral hygiene is good. No erythema, swelling, or exudate. Tongue normal, non-obstructing. Tonsils not swollen or erythematous. Hearing  normal.  Neck: Supple, thyroid not palpable. No bruits, nodes or JVD. Respiratory: Respiratory effort normal.  BS equal and clear bilateral without rales, rhonci, wheezing or stridor. Cardio: Heart sounds are normal with regular rate and rhythm and no murmurs, rubs or gallops. Peripheral pulses are normal and equal bilaterally without edema. No aortic or femoral bruits. Chest: symmetric with normal excursions and percussion.  Abdomen: Soft, with Nl bowel sounds. Nontender, no guarding, rebound, hernias, masses, or organomegaly.  Lymphatics: Non tender without lymphadenopathy.  Musculoskeletal: Full ROM all peripheral extremities, joint stability, 5/5 strength, and normal gait. Skin: Warm and dry without rashes, lesions, cyanosis, clubbing or  ecchymosis.  Neuro: Cranial nerves intact, reflexes equal bilaterally. Normal muscle tone, no cerebellar symptoms. Sensation intact.  Pysch: Alert and oriented X 3 with normal affect, insight and judgment appropriate.   Assessment and Plan  1. Annual Preventative/Screening Exam    2. Essential hypertension  - EKG 12-Lead - [place], RETROPERITNL ABD,  LTD - Urinalysis, Routine w reflex microscopic - Microalbumin /  creatinine urine ratio - CBC with Differential/Platelet - COMPLETE METABOLIC PANEL WITH GFR - Magnesium - TSH   3. Hyperlipidemia, mixed  - EKG 12-Lead - [place], RETROPERITNL ABD,  LTD - Lipid panel - TSH   4. Abnormal glucose  - EKG 12-Lead - [place], RETROPERITNL ABD,  LTD - Hemoglobin A1c - Insulin, random   5. Vitamin D deficiency  - VITAMIN D 25 Hydroxy   6. Gastroesophageal reflux disease    7. Screening for colorectal cancer  - POC Hemoccult Bld/Stl  - CBC with Differential/Platelet   8. Prostate cancer screening  - PSA   9. Screening for heart disease  - EKG 12-Lead   10. FHx: heart disease  - EKG 12-Lead - [place], RETROPERITNL ABD,  LTD   11. Former smoker  - EKG 12-Lead - [place], RETROPERITNL ABD,  LTD   12. Screening for AAA (aortic abdominal aneurysm)  - [place], RETROPERITNL ABD,  LTD   13. Fatigue, unspecified type  - Iron, Total/Total Iron Binding Cap - Vitamin B12 - Testosterone - CBC with Differential/Platelet - TSH   14. Medication management  - Urinalysis, Routine w reflex microscopic - Microalbumin / creatinine urine ratio - CBC with Differential/Platelet - COMPLETE METABOLIC PANEL WITH GFR - Magnesium - Lipid panel - TSH - Hemoglobin A1c - Insulin, random - VITAMIN D 25 Hydroxy         Patient was counseled in prudent diet, weight control to achieve/maintain BMI less than 25, BP monitoring, regular exercise and medications as discussed.  Discussed med effects and SE's. Routine screening labs and tests as requested with regular follow-up as recommended. Over 40 minutes of exam, counseling, chart review and high complex critical decision making was performed   Matthew Maw, MD

## 2023-06-20 ENCOUNTER — Ambulatory Visit (INDEPENDENT_AMBULATORY_CARE_PROVIDER_SITE_OTHER): Payer: BC Managed Care – PPO | Admitting: Internal Medicine

## 2023-06-20 ENCOUNTER — Encounter: Payer: Self-pay | Admitting: Internal Medicine

## 2023-06-20 VITALS — BP 138/80 | HR 89 | Temp 97.4°F | Resp 16 | Ht 71.0 in | Wt 229.6 lb

## 2023-06-20 DIAGNOSIS — Z0000 Encounter for general adult medical examination without abnormal findings: Secondary | ICD-10-CM | POA: Diagnosis not present

## 2023-06-20 DIAGNOSIS — E559 Vitamin D deficiency, unspecified: Secondary | ICD-10-CM | POA: Diagnosis not present

## 2023-06-20 DIAGNOSIS — Z136 Encounter for screening for cardiovascular disorders: Secondary | ICD-10-CM

## 2023-06-20 DIAGNOSIS — Z13220 Encounter for screening for lipoid disorders: Secondary | ICD-10-CM

## 2023-06-20 DIAGNOSIS — Z125 Encounter for screening for malignant neoplasm of prostate: Secondary | ICD-10-CM

## 2023-06-20 DIAGNOSIS — R7309 Other abnormal glucose: Secondary | ICD-10-CM

## 2023-06-20 DIAGNOSIS — Z0001 Encounter for general adult medical examination with abnormal findings: Secondary | ICD-10-CM

## 2023-06-20 DIAGNOSIS — Z8249 Family history of ischemic heart disease and other diseases of the circulatory system: Secondary | ICD-10-CM

## 2023-06-20 DIAGNOSIS — R5383 Other fatigue: Secondary | ICD-10-CM

## 2023-06-20 DIAGNOSIS — Z1389 Encounter for screening for other disorder: Secondary | ICD-10-CM

## 2023-06-20 DIAGNOSIS — Z1211 Encounter for screening for malignant neoplasm of colon: Secondary | ICD-10-CM

## 2023-06-20 DIAGNOSIS — Z79899 Other long term (current) drug therapy: Secondary | ICD-10-CM | POA: Diagnosis not present

## 2023-06-20 DIAGNOSIS — Z131 Encounter for screening for diabetes mellitus: Secondary | ICD-10-CM | POA: Diagnosis not present

## 2023-06-20 DIAGNOSIS — I700 Atherosclerosis of aorta: Secondary | ICD-10-CM

## 2023-06-20 DIAGNOSIS — N401 Enlarged prostate with lower urinary tract symptoms: Secondary | ICD-10-CM | POA: Diagnosis not present

## 2023-06-20 DIAGNOSIS — Z130 Encounter for screening for diseases of the blood and blood-forming organs and certain disorders involving the immune mechanism: Secondary | ICD-10-CM | POA: Diagnosis not present

## 2023-06-20 DIAGNOSIS — R350 Frequency of micturition: Secondary | ICD-10-CM

## 2023-06-20 DIAGNOSIS — Z1329 Encounter for screening for other suspected endocrine disorder: Secondary | ICD-10-CM | POA: Diagnosis not present

## 2023-06-20 DIAGNOSIS — I10 Essential (primary) hypertension: Secondary | ICD-10-CM

## 2023-06-20 DIAGNOSIS — Z111 Encounter for screening for respiratory tuberculosis: Secondary | ICD-10-CM | POA: Diagnosis not present

## 2023-06-20 DIAGNOSIS — K219 Gastro-esophageal reflux disease without esophagitis: Secondary | ICD-10-CM

## 2023-06-20 DIAGNOSIS — Z87891 Personal history of nicotine dependence: Secondary | ICD-10-CM

## 2023-06-20 DIAGNOSIS — E782 Mixed hyperlipidemia: Secondary | ICD-10-CM

## 2023-06-21 ENCOUNTER — Other Ambulatory Visit: Payer: Self-pay | Admitting: Internal Medicine

## 2023-06-21 DIAGNOSIS — E782 Mixed hyperlipidemia: Secondary | ICD-10-CM

## 2023-06-21 MED ORDER — NEXLIZET 180-10 MG PO TABS: ORAL_TABLET | ORAL | 3 refills | Status: DC

## 2023-06-21 NOTE — Progress Notes (Signed)
<>*<>*<>*<>*<>*<>*<>*<>*<>*<>*<>*<>*<>*<>*<>*<>*<>*<>*<>*<>*<>*<>*<>*<>*<> <>*<>*<>*<>*<>*<>*<>*<>*<>*<>*<>*<>*<>*<>*<>*<>*<>*<>*<>*<>*<>*<>*<>*<>*<>  -Test results slightly outside the reference range are not unusual. If there is anything important, I will review this with you,  otherwise it is considered normal test values.  If you have further questions,  please do not hesitate to contact me at the office or via My Chart.   <>*<>*<>*<>*<>*<>*<>*<>*<>*<>*<>*<>*<>*<>*<>*<>*<>*<>*<>*<>*<>*<>*<>*<>*<> <>*<>*<>*<>*<>*<>*<>*<>*<>*<>*<>*<>*<>*<>*<>*<>*<>*<>*<>*<>*<>*<>*<>*<>*<>   - Total Chol = 243   is very high risk for Heart Attack /Stroke /Vascular Dementia     ( Ideal or Goal is less than 180 ! )  & - Bad /Dangerous LDL Chol =  122   - - >> Sitting on a time Bomb !     ( Ideal or Goal is less than 70 ! )   - Treating with meds to lower Cholesterol is treating the result                                           & NOT treating the cause  - The cause is Bad Diet !  - But need to go ahead & add another med  until get on a better diet to try &                                                        reverse some of the Damage already done   - So sent in a new med called Nexlezet   - Read or listen to   Dr Gerri Spore 's book    " How Not to Die ! "    - Recommend a stricter plant based low cholesterol diet   - Cholesterol only comes from animal sources                                                                  - ie. meat, dairy, egg yolks  - Eat all the vegetables you want.  - Avoid Meat, Avoid Meat , Avoid Meat  ! ! !                                                   -especially red meat - Beef AND Pork  - Avoid cheese & dairy - milk & ice cream.   - Cheese is the most concentrated form of trans-fats which                                                     is the worst thing to clog up our  arteries.   - Veggie cheese is OK which can be found in  the fresh produce section at                                                          [facility] or [employer] or [employer]  <>*<>*<>*<>*<>*<>*<>*<>*<>*<>*<>*<>*<>*<>*<>*<>*<>*<>*<>*<>*<>*<>*<>*<>*<> <>*<>*<>*<>*<>*<>*<>*<>*<>*<>*<>*<>*<>*<>*<>*<>*<>*<>*<>*<>*<>*<>*<>*<>*<>  -  Vitamin B 12   &   Iron  levels  are  Normal  & OK   <>*<>*<>*<>*<>*<>*<>*<>*<>*<>*<>*<>*<>*<>*<>*<>*<>*<>*<>*<>*<>*<>*<>*<>*<> <>*<>*<>*<>*<>*<>*<>*<>*<>*<>*<>*<>*<>*<>*<>*<>*<>*<>*<>*<>*<>*<>*<>*<>*<>  -  PSA  is Low  - No Prostate Cancer   -   Great !  <>*<>*<>*<>*<>*<>*<>*<>*<>*<>*<>*<>*<>*<>*<>*<>*<>*<>*<>*<>*<>*<>*<>*<>*<> <>*<>*<>*<>*<>*<>*<>*<>*<>*<>*<>*<>*<>*<>*<>*<>*<>*<>*<>*<>*<>*<>*<>*<>*<>  -  Testosterone Level I S Normal   <>*<>*<>*<>*<>*<>*<>*<>*<>*<>*<>*<>*<>*<>*<>*<>*<>*<>*<>*<>*<>*<>*<>*<>*<> <>*<>*<>*<>*<>*<>*<>*<>*<>*<>*<>*<>*<>*<>*<>*<>*<>*<>*<>*<>*<>*<>*<>*<>*<>  -  A1c  is Normal - No Diabetes      Great    !   <>*<>*<>*<>*<>*<>*<>*<>*<>*<>*<>*<>*<>*<>*<>*<>*<>*<>*<>*<>*<>*<>*<>*<>*<> <>*<>*<>*<>*<>*<>*<>*<>*<>*<>*<>*<>*<>*<>*<>*<>*<>*<>*<>*<>*<>*<>*<>*<>*<>  -  Vitamin D = 49  - is a little low   - Vitamin D goal is between 70-100.   - Please INCREASE your Vitamin D 5,000 u up to                                                                         # 2 capsules = 10,000 units Every Day  !    - It is very important as a natural anti-inflammatory and helping the               immune system protect against viral infections, like the Flu &  Covid-19    - Also  helps hair, skin, and nails, as well as reducing stroke and heart attack risk.   - It helps your bones and helps with mood.  - It also decreases numerous cancer risks so please                                                                                           take it as directed.   - Low Vit D is  associated with a 200-300% higher risk for CANCER   and 200-300% higher risk for HEART   ATTACK  &  STROKE.    - It is also associated with higher death rate at younger ages,   autoimmune diseases like Rheumatoid arthritis, Lupus, Multiple Sclerosis.     - Also many other serious conditions, like depression, Alzheimer's  Dementia,  muscle aches, fatigue, fibromyalgia   <>*<>*<>*<>*<>*<>*<>*<>*<>*<>*<>*<>*<>*<>*<>*<>*<>*<>*<>*<>*<>*<>*<>*<>*<> <>*<>*<>*<>*<>*<>*<>*<>*<>*<>*<>*<>*<>*<>*<>*<>*<>*<>*<>*<>*<>*<>*<>*<>*<>  -  All Else - CBC - Kidneys - Electrolytes - Liver - Magnesium & Thyroid    - all  Normal / OK  <>*<>*<>*<>*<>*<>*<>*<>*<>*<>*<>*<>*<>*<>*<>*<>*<>*<>*<>*<>*<>*<>*<>*<>*<> <>*<>*<>*<>*<>*<>*<>*<>*<>*<>*<>*<>*<>*<>*<>*<>*<>*<>*<>*<>*<>*<>*<>*<>*<>

## 2023-06-22 ENCOUNTER — Ambulatory Visit: Payer: BC Managed Care – PPO

## 2023-06-22 DIAGNOSIS — M48062 Spinal stenosis, lumbar region with neurogenic claudication: Secondary | ICD-10-CM | POA: Diagnosis not present

## 2023-06-22 DIAGNOSIS — M5416 Radiculopathy, lumbar region: Secondary | ICD-10-CM | POA: Diagnosis not present

## 2023-06-22 DIAGNOSIS — R531 Weakness: Secondary | ICD-10-CM

## 2023-06-23 LAB — INSULIN, RANDOM: Insulin: 9.3 u[IU]/mL

## 2023-06-23 LAB — VITAMIN B12: Vitamin B-12: 650 pg/mL (ref 200–1100)

## 2023-06-23 LAB — HEMOGLOBIN A1C
Hgb A1c MFr Bld: 5.6 %{Hb} (ref <5.7)
Mean Plasma Glucose: 114 mg/dL
eAG (mmol/L): 6.3 mmol/L

## 2023-06-23 LAB — CBC WITH DIFFERENTIAL/PLATELET
Absolute Lymphocytes: 1904 {cells}/uL (ref 850–3900)
Absolute Monocytes: 442 {cells}/uL (ref 200–950)
Basophils Absolute: 50 {cells}/uL (ref 0–200)
Basophils Relative: 0.9 %
Eosinophils Absolute: 90 {cells}/uL (ref 15–500)
Eosinophils Relative: 1.6 %
HCT: 44 % (ref 38.5–50.0)
Hemoglobin: 14.8 g/dL (ref 13.2–17.1)
MCH: 32.2 pg (ref 27.0–33.0)
MCHC: 33.6 g/dL (ref 32.0–36.0)
MCV: 95.7 fL (ref 80.0–100.0)
MPV: 10.9 fL (ref 7.5–12.5)
Monocytes Relative: 7.9 %
Neutro Abs: 3114 {cells}/uL (ref 1500–7800)
Neutrophils Relative %: 55.6 %
Platelets: 278 10*3/uL (ref 140–400)
RBC: 4.6 10*6/uL (ref 4.20–5.80)
RDW: 11.8 % (ref 11.0–15.0)
Total Lymphocyte: 34 %
WBC: 5.6 10*3/uL (ref 3.8–10.8)

## 2023-06-23 LAB — COMPLETE METABOLIC PANEL WITH GFR
AG Ratio: 1.9 (calc) (ref 1.0–2.5)
ALT: 23 U/L (ref 9–46)
AST: 25 U/L (ref 10–35)
Albumin: 4.7 g/dL (ref 3.6–5.1)
Alkaline phosphatase (APISO): 64 U/L (ref 35–144)
BUN: 9 mg/dL (ref 7–25)
CO2: 28 mmol/L (ref 20–32)
Calcium: 9.9 mg/dL (ref 8.6–10.3)
Chloride: 100 mmol/L (ref 98–110)
Creat: 0.87 mg/dL (ref 0.70–1.30)
Globulin: 2.5 g/dL (ref 1.9–3.7)
Glucose, Bld: 109 mg/dL — ABNORMAL HIGH (ref 65–99)
Potassium: 4.4 mmol/L (ref 3.5–5.3)
Sodium: 139 mmol/L (ref 135–146)
Total Bilirubin: 1.1 mg/dL (ref 0.2–1.2)
Total Protein: 7.2 g/dL (ref 6.1–8.1)
eGFR: 99 mL/min/{1.73_m2} (ref >=60)

## 2023-06-23 LAB — MICROALBUMIN / CREATININE URINE RATIO
Creatinine, Urine: 33 mg/dL (ref 20–320)
Microalb Creat Ratio: 12 mg/g{creat} (ref <30)
Microalb, Ur: 0.4 mg/dL

## 2023-06-23 LAB — URINALYSIS, ROUTINE W REFLEX MICROSCOPIC
Bilirubin Urine: NEGATIVE
Glucose, UA: NEGATIVE
Hgb urine dipstick: NEGATIVE
Ketones, ur: NEGATIVE
Leukocytes,Ua: NEGATIVE
Nitrite: NEGATIVE
Protein, ur: NEGATIVE
Specific Gravity, Urine: 1.006 (ref 1.001–1.035)
pH: 6 (ref 5.0–8.0)

## 2023-06-23 LAB — LIPID PANEL
Cholesterol: 243 mg/dL — ABNORMAL HIGH (ref <200)
HDL: 67 mg/dL (ref >=40)
LDL Cholesterol (Calc): 122 mg/dL — ABNORMAL HIGH
Non-HDL Cholesterol (Calc): 176 mg/dL — ABNORMAL HIGH (ref <130)
Total CHOL/HDL Ratio: 3.6 (calc) (ref <5.0)
Triglycerides: 379 mg/dL — ABNORMAL HIGH (ref <150)

## 2023-06-23 LAB — MAGNESIUM: Magnesium: 2.1 mg/dL (ref 1.5–2.5)

## 2023-06-23 LAB — IRON,TIBC AND FERRITIN PANEL
%SAT: 44 % (ref 20–48)
Ferritin: 298 ng/mL (ref 38–380)
Iron: 166 ug/dL (ref 50–180)
TIBC: 381 ug/dL (ref 250–425)

## 2023-06-23 LAB — VITAMIN D 25 HYDROXY (VIT D DEFICIENCY, FRACTURES): Vit D, 25-Hydroxy: 49 ng/mL (ref 30–100)

## 2023-06-23 LAB — TESTOSTERONE: Testosterone: 520 ng/dL (ref 250–827)

## 2023-06-23 LAB — PSA: PSA: 1 ng/mL (ref <=4.00)

## 2023-06-23 LAB — TSH: TSH: 1.28 m[IU]/L (ref 0.40–4.50)

## 2023-07-09 ENCOUNTER — Other Ambulatory Visit: Payer: Self-pay | Admitting: Internal Medicine

## 2023-07-09 DIAGNOSIS — I10 Essential (primary) hypertension: Secondary | ICD-10-CM

## 2023-07-15 ENCOUNTER — Ambulatory Visit: Payer: BC Managed Care – PPO | Admitting: Neurosurgery

## 2023-07-15 DIAGNOSIS — M48062 Spinal stenosis, lumbar region with neurogenic claudication: Secondary | ICD-10-CM | POA: Diagnosis not present

## 2023-07-15 NOTE — Progress Notes (Signed)
Neurosurgery Telephone (Audio-Only) Note  Requesting Provider     No referring provider defined for this encounter. T: N/A F:   Primary Care Provider Lucky Cowboy, MD [address] [place] [address] T: [phone] F: [phone]  Telehealth visit was conducted with [name], a 57 y.o. male via telephone.  History of Present Illness: [name] is a 57 y.o presenting today via telephone visit to review his most recent imaging studies.  He states that his symptoms are largely unchanged since his last visit.  He continues to have heaviness and subjective weakness in his lower extremities that make it difficult for him to do his daily tasks as well as intermittent pain into his bilateral hips.  He has not seen any improvement with physical therapy and feels that it has made things somewhat worse.  He would like to discuss possible surgical intervention.  06/05/23 Matthew Salinas is a 57 year old presenting today via telephone visit to review his progress after Medrol Dosepak and Robaxin.  He states that he had some mild improvement for 2 or 3 days when starting his Medrol Dosepak but that his symptoms have returned.  He states he feels as if he is worse than he was prior to his initial evaluation.  He now feels as if his leg is weak and he has having episodes of feeling as though his leg is giving out on him.   05/29/23 Matthew Salinas is a 57 y.o. male who presents with the chief complaint of ongoing back pain.  He reports he was doing relatively well until Monday of this week when he was moving his camper and felt a pop in his back and worsening mid and lower back pain with numbness down his right leg.  He denies any focal weakness, bowel or bladder dysfunction.  He has continued the exercises that were given to him in physical therapy and has taken anti-inflammatories.   01/14/23 Matthew Salinas is a 57 year old presenting today via telephone visit.  Overall  he reports improvement of his back and buttock pain with physical therapy.  He states he has 1 more session this week.  He feels that his current symptoms are manageable.   12/17/2022 [name] is a 57 year old with a history of anxiety, GERD, hypertension, CAD, and hyperlipidemia who is here today with a chief complaint of 6+ months of low back pain with intermittent radiating leg pain.  He states that this started without any particular inciting event and has worsened over the last couple of months.  He describes pain that starts in his lower back and radiates into his buttocks down the posterior lateral aspect of his legs and into his ankle.  Describes his pain as a burning/stinging sensation with associated numbness.  This occurs on both the left and right side interchangeably.  He states it is worse with activity particularly with bending to do things such as gardening.  He has been taking Tylenol, Flexeril, and NSAIDs with minimal relief.  He did see his primary care provider who ordered an MRI of his lumbar spine and prescribed Decadron which she has not taken yet.  He denies any significant lower extremity weakness.  General Review of Systems:  A ROS was performed including pertinent positive and negatives as documented.  All other systems are negative.   Prior to Admission medications   Medication Sig Start Date End Date Taking? Authorizing Provider  Bempedoic Acid-Ezetimibe (NEXLIZET) 180-10 MG TABS Take  1 tablet  Daily  for Cholesterol 06/21/23   Lucky Cowboy, MD  celecoxib (CELEBREX) 100 MG capsule Take 1 tab q 12 hours as needed 11/25/22   Raynelle Dick, NP  cholecalciferol (VITAMIN D) 1000 UNITS tablet Take 5,000 Units by mouth daily.     [provider]  co-enzyme Q-10 30 MG capsule Take 30 mg by mouth 3 (three) times daily.    [provider]  famotidine (PEPCID) 20 MG tablet Take  1 tablet  2 x /day to Prevent Heart burn & Indigestion                                                            /                                       TAKE                                            BY                                              MOUTH 11/25/22   Lucky Cowboy, MD  lidocaine-prilocaine (EMLA) cream APPLY TOPICALLY AS NEEDED 01/15/18   Lucky Cowboy, MD  lisinopril-hydrochlorothiazide (ZESTORETIC) 20-25 MG tablet TAKE 1/2 (ONE-HALF) TABLET BY MOUTH ONCE DAILY FOR BLOOD PRESSURE 07/09/23   Adela Glimpse, NP  methocarbamol (ROBAXIN-750) 750 MG tablet Take 1 tablet (750 mg total) by mouth 3 (three) times daily as needed for muscle spasms. 05/29/23   Susanne Borders, PA  montelukast (SINGULAIR) 10 MG tablet TAKE 1 TABLET BY MOUTH ONCE DAILY FOR ALLERGIES AND FOR ASTHMA 04/24/23   Raynelle Dick, NP  Multiple Vitamins-Minerals (MULTIVITAMIN PO) Take by mouth.    [provider]  nitroGLYCERIN (NITROGLYN) 2 % ointment Apply   1/2 "  to Hemorrhoid  every 6 hours  as needed 05/02/22   Lucky Cowboy, MD  Omega-3 Fatty Acids (FISH OIL PO) Take by mouth.    [provider]  PROCTO-MED HC 2.5 % rectal cream APPLY CREAM  RECTALLY 4 TIMES DAILY 07/04/20   Lucky Cowboy, MD  rosuvastatin (CRESTOR) 20 MG tablet TAKE 1 TABLET BY MOUTH ONCE DAILY FOR CHOLESTEROL 02/14/23   Raynelle Dick, NP     Imaging Studies  06/22/23 MRI L spine FINDINGS: Segmentation: The lowest lumbar type non-rib-bearing vertebra is labeled as L5.   Alignment:  3 mm degenerative grade 1 anterolisthesis at L3-4.   Vertebrae: Intervertebral disc desiccation is observed at all levels between L3 and S1 with loss of disc height at L5-S1.   Congenitally short pedicles in the lumbar spine.   Conus medullaris and cauda equina: Conus extends to the T12-L1 level. Conus and cauda equina appear normal.   Paraspinal and other soft tissues: Pelvic left kidney partially visualized.   Disc levels:   L1-2: Unremarkable   L2-3: Mild central narrowing of the  thecal sac and borderline left foraminal stenosis due  to short pedicles and mild disc bulge.   L3-4: Prominent central narrowing of the thecal sac and mild bilateral foraminal stenosis along with mild left subarticular lateral recess stenosis due to diffuse disc bulge, small left lateral recess disc protrusion, facet arthropathy, and short pedicles. The cross-sectional area of the thecal sac is 0.3 cm^2 at this level, similar to previous.   L4-5: Prominent central narrowing of the thecal sac with mild to moderate left subarticular lateral recess stenosis due to disc bulge, central disc protrusion, short pedicles, and mild facet arthropathy. Cross-sectional area of the thecal sac is 0.45 cm^2, minimally worsened from previous.   L5-S1: No impingement.  Small central disc protrusion.   IMPRESSION: 1. Lumbar spondylosis and degenerative disc disease, causing prominent impingement at L3-4 and L4-5; and mild impingement at L2-3. Impingement at L4-5 is minimally worsened from 12/10/2022. 2. Congenitally short pedicles in the lumbar spine. 3. Pelvic left kidney partially visualized.     Electronically Signed   By: Gaylyn Rong M.D.   On: 07/12/2023 10:04   [name] is a 57 y.o. male who I performed a telephone encounter today for evaluation and management of symptomatic lumbar stenosis PLAN  Matthew Salinas is a very pleasant 57 year old presenting with symptomatic lumbar stenosis despite conservative management.  This is affecting his quality of life and he is not made significant improvements.  He would like to discuss surgical intervention.  We briefly discussed injections however he is not having a significant and consistent degree of radiculopathy, rather symptoms now concerning for neurogenic claudication.  We discussed that while injections are not contraindicated, they may be less effective for this type of problem and he has expressed some disinterest in pursuing this intervention  in the past.  He will follow-up with Dr. Myer Haff in a couple of days to discuss possible surgical intervention.  He was encouraged to call our office in the interim with any questions or concerns.  He expressed understanding and was in agreement with this plan.  No orders of the defined types were placed in this encounter.   DISPOSITION  Follow up: In person appointment in  with Dr. Micheline Chapman to discuss surgical interventions.  He is scheduled on 07/17/2023 for this.  Susanne Borders, PA   TELEPHONE DOCUMENTATION   This visit was performed via telephone.  Patient location: home Provider location: office  I spent a total of 5 minutes non-face-to-face activities for this visit on the date of this encounter including review of current clinical condition and response to treatment.  The patient is aware of and accepts the limits of this telehealth visit.

## 2023-07-16 NOTE — H&P (View-Only) (Signed)
 Referring Physician:  Lucky Cowboy, MD [address] [place],  [place] [address]  Primary Physician:  Lucky Cowboy, MD  History of Present Illness: 07/17/2023 [name] is here today with a chief complaint of bilateral lower extremity discomfort.  This is particular bad when he stands or walks.  It worsens and then gets better when he sits or lays down.  He feels like his legs will not support him.  He tried physical therapy without improvement.  He is bending forward and reaches for a cart when he goes to the store.  Progress Note from Manning Charity, [place] on 07/15/23:  History of Present Illness: [name] is a 57 y.o presenting today via telephone visit to review his most recent imaging studies.  He states that his symptoms are largely unchanged since his last visit.  He continues to have heaviness and subjective weakness in his lower extremities that make it difficult for him to do his daily tasks as well as intermittent pain into his bilateral hips.  He has not seen any improvement with physical therapy and feels that it has made things somewhat worse.  He would like to discuss possible surgical intervention.   06/05/23 [name] is a 57 year old presenting today via telephone visit to review his progress after Medrol Dosepak and Robaxin.  He states that he had some mild improvement for 2 or 3 days when starting his Medrol Dosepak but that his symptoms have returned.  He states he feels as if he is worse than he was prior to his initial evaluation.  He now feels as if his leg is weak and he has having episodes of feeling as though his leg is giving out on him.   05/29/23 Matthew Salinas is a 57 y.o. male who presents with the chief complaint of ongoing back pain.  He reports he was doing relatively well until Monday of this week when he was moving his camper and felt a pop in his back and worsening mid and lower back pain with numbness down his  right leg.  He denies any focal weakness, bowel or bladder dysfunction.  He has continued the exercises that were given to him in physical therapy and has taken anti-inflammatories.   01/14/23 [name] is a 57 year old presenting today via telephone visit.  Overall he reports improvement of his back and buttock pain with physical therapy.  He states he has 1 more session this week.  He feels that his current symptoms are manageable.   12/17/2022 [name] is a 57 year old with a history of anxiety, GERD, hypertension, CAD, and hyperlipidemia who is here today with a chief complaint of 6+ months of low back pain with intermittent radiating leg pain.  He states that this started without any particular inciting event and has worsened over the last couple of months.  He describes pain that starts in his lower back and radiates into his buttocks down the posterior lateral aspect of his legs and into his ankle.  Describes his pain as a burning/stinging sensation with associated numbness.  This occurs on both the left and right side interchangeably.  He states it is worse with activity particularly with bending to do things such as gardening.  He has been taking Tylenol, Flexeril, and NSAIDs with minimal relief.  He did see his primary care provider who ordered an MRI of his lumbar spine and prescribed Decadron which she has not taken yet.  He denies any significant  lower extremity weakness.  Review of Systems:  A 10 point review of systems is negative, except for the pertinent positives and negatives detailed in the HPI.  Past Medical History: Past Medical History:  Diagnosis Date   Allergy    GERD (gastroesophageal reflux disease)    Hyperlipidemia    Hypertension    IBS (irritable bowel syndrome)    Traumatic pneumothorax 12/04/2015   Vitamin D deficiency     Past Surgical History: Past Surgical History:  Procedure Laterality Date   CHEST TUBE INSERTION Right 05-15-02   thoracostomy    ESOPHAGOGASTRODUODENOSCOPY (EGD) WITH PROPOFOL N/A 12/15/2020   Procedure: ESOPHAGOGASTRODUODENOSCOPY (EGD) WITH PROPOFOL;  Surgeon: Earline Mayotte, MD;  Location: [facility] ENDOSCOPY;  Service: Endoscopy;  Laterality: N/A;  with possible dilation   TOE SURGERY  2008   right big toe    Allergies: Allergies as of 07/17/2023 - Review Complete 07/17/2023  Allergen Reaction Noted   Lipitor [atorvastatin]  05/16/2014   Prozac [fluoxetine hcl]  07/13/2013   Rosuvastatin  01/25/2019   Simvastatin  05/16/2014    Medications:  Current Outpatient Medications:    celecoxib (CELEBREX) 100 MG capsule, Take 1 tab q 12 hours as needed, Disp: 60 capsule, Rfl: 2   cholecalciferol (VITAMIN D) 1000 UNITS tablet, Take 5,000 Units by mouth daily. , Disp: , Rfl:    co-enzyme Q-10 30 MG capsule, Take 30 mg by mouth 3 (three) times daily., Disp: , Rfl:    famotidine (PEPCID) 20 MG tablet, Take  1 tablet  2 x /day to Prevent Heart burn & Indigestion                                                           /                                       TAKE                                            BY                                              MOUTH (Patient taking differently: Take  1 tablet  2 x /day to Prevent Heart burn & Indigestion as needed), Disp: 180 tablet, Rfl: 3   lidocaine-prilocaine (EMLA) cream, APPLY TOPICALLY AS NEEDED, Disp: 90 g, Rfl: 11   lisinopril-hydrochlorothiazide (ZESTORETIC) 20-25 MG tablet, TAKE 1/2 (ONE-HALF) TABLET BY MOUTH ONCE DAILY FOR BLOOD PRESSURE, Disp: 46 tablet, Rfl: 0   methocarbamol (ROBAXIN-750) 750 MG tablet, Take 1 tablet (750 mg total) by mouth 3 (three) times daily as needed for muscle spasms., Disp: 90 tablet, Rfl: 0   montelukast (SINGULAIR) 10 MG tablet, TAKE 1 TABLET BY MOUTH ONCE DAILY FOR ALLERGIES AND FOR ASTHMA, Disp: 90 tablet, Rfl: 0   Multiple Vitamins-Minerals (MULTIVITAMIN PO), Take by mouth., Disp: , Rfl:    nitroGLYCERIN (NITROGLYN) 2 % ointment, Apply    1/2 "  to Hemorrhoid  every 6 hours  as needed, Disp: 60 g, Rfl: 3   Omega-3 Fatty Acids (FISH OIL PO), Take by mouth., Disp: , Rfl:    PROCTO-MED HC 2.5 % rectal cream, APPLY CREAM  RECTALLY 4 TIMES DAILY, Disp: 56 g, Rfl: 3   rosuvastatin (CRESTOR) 20 MG tablet, TAKE 1 TABLET BY MOUTH ONCE DAILY FOR CHOLESTEROL (Patient taking differently: 10 mg. TAKE 1/2 TABLET(10mg ) BY MOUTH ONCE DAILY FOR CHOLESTEROL), Disp: 90 tablet, Rfl: 0  Social History: Social History   Tobacco Use   Smoking status: Former    Current packs/day: 0.00    Types: Cigarettes    Quit date: 08/05/2001    Years since quitting: 21.9   Smokeless tobacco: Never   Tobacco comments:    Smoked 5-10 cigarettes per day since age 38  Substance Use Topics   Alcohol use: Yes    Alcohol/week: 10.0 standard drinks of alcohol    Types: 10 Standard drinks or equivalent per week   Drug use: No    Family Medical History: Family History  Problem Relation Age of Onset   Cancer Mother        lung   Hyperlipidemia Mother    Hypertension Father    Hyperlipidemia Father    Dementia Brother    Hyperlipidemia Brother    Hyperlipidemia Brother    Hyperlipidemia Brother    Colon cancer Maternal Grandmother    Diabetes Maternal Grandfather     Physical Examination: Vitals:   07/17/23 1255  BP: 138/88    General: Patient is in no apparent distress. Attention to examination is appropriate.  Neck:   Supple.  Full range of motion.  Respiratory: Patient is breathing without any difficulty.   NEUROLOGICAL:     Awake, alert, oriented to person, place, and time.  Speech is clear and fluent.   Cranial Nerves: Pupils equal round and reactive to light.  Facial tone is symmetric.  Facial sensation is symmetric. Shoulder shrug is symmetric. Tongue protrusion is midline.  There is no pronator drift.  Strength: Side Biceps Triceps Deltoid Interossei Grip Wrist Ext. Wrist Flex.  R 5 5 5 5 5 5 5   L 5 5 5 5 5 5 5    Side Iliopsoas  Quads Hamstring PF DF EHL  R 5 5 5 5 5 5   L 5 5 5 5 5 5    Reflexes are 1+ and symmetric at the biceps, triceps, brachioradialis, patella and achilles.   Hoffman's is absent.   Bilateral upper and lower extremity sensation is intact to light touch.    No evidence of dysmetria noted.  Gait is normal.     Medical Decision Making  Imaging: MRI L spine 06/22/2023 Disc levels:   L1-2: Unremarkable   L2-3: Mild central narrowing of the thecal sac and borderline left foraminal stenosis due to short pedicles and mild disc bulge.   L3-4: Prominent central narrowing of the thecal sac and mild bilateral foraminal stenosis along with mild left subarticular lateral recess stenosis due to diffuse disc bulge, small left lateral recess disc protrusion, facet arthropathy, and short pedicles. The cross-sectional area of the thecal sac is 0.3 cm^2 at this level, similar to previous.   L4-5: Prominent central narrowing of the thecal sac with mild to moderate left subarticular lateral recess stenosis due to disc bulge, central disc protrusion, short pedicles, and mild facet arthropathy. Cross-sectional area of the thecal sac is 0.45 cm^2, minimally worsened from previous.   L5-S1: No impingement.  Small central disc protrusion.   IMPRESSION: 1. Lumbar spondylosis and degenerative disc disease, causing prominent impingement at L3-4 and L4-5; and mild impingement at L2-3. Impingement at L4-5 is minimally worsened from 12/10/2022. 2. Congenitally short pedicles in the lumbar spine. 3. Pelvic left kidney partially visualized.     Electronically Signed   By: Gaylyn Rong M.D.   On: 07/12/2023 10:04    I have personally reviewed the images and agree with the above interpretation.  Assessment and Plan: Matthew Salinas is a pleasant 57 y.o. 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  He has tried physical therapy without  improvement.  There is no role for further conservative management.  I recommended that he consider lumbar decompression.  I have recommended L3-5 lumbar decompression.  I discussed the planned procedure at length with the patient, including the risks, benefits, alternatives, and indications. The risks discussed include but are not limited to bleeding, infection, need for reoperation, spinal fluid leak, stroke, vision loss, anesthetic complication, coma, paralysis, and even death. I also described in detail that improvement was not guaranteed.  The patient expressed understanding of these risks, and asked that we proceed with surgery. I described the surgery in layman's terms, and gave ample opportunity for questions, which were answered to the best of my ability.  I spent a total of 30 minutes in this patient's care today. This time was spent reviewing pertinent records including imaging studies, obtaining and confirming history, performing a directed evaluation, formulating and discussing my recommendations, and documenting the visit within the medical record.     Thank you for involving me in the care of this patient.      Yarelie Hams K. Myer Haff MD, [facility]

## 2023-07-16 NOTE — Progress Notes (Unsigned)
Referring Physician:  Lucky Cowboy, MD [address] [place],  [place] [address]  Primary Physician:  Lucky Cowboy, MD  History of Present Illness: 07/17/2023 Matthew Salinas is here today with a chief complaint of bilateral lower extremity discomfort.  This is particular bad when he stands or walks.  It worsens and then gets better when he sits or lays down.  He feels like his legs will not support him.  He tried physical therapy without improvement.  He is bending forward and reaches for a cart when he goes to the store.  Progress Note from Manning Charity, [place] on 07/15/23:  History of Present Illness: Matthew Salinas is a 57 y.o presenting today via telephone visit to review his most recent imaging studies.  He states that his symptoms are largely unchanged since his last visit.  He continues to have heaviness and subjective weakness in his lower extremities that make it difficult for him to do his daily tasks as well as intermittent pain into his bilateral hips.  He has not seen any improvement with physical therapy and feels that it has made things somewhat worse.  He would like to discuss possible surgical intervention.   06/05/23 Matthew Salinas is a 57 year old presenting today via telephone visit to review his progress after Medrol Dosepak and Robaxin.  He states that he had some mild improvement for 2 or 3 days when starting his Medrol Dosepak but that his symptoms have returned.  He states he feels as if he is worse than he was prior to his initial evaluation.  He now feels as if his leg is weak and he has having episodes of feeling as though his leg is giving out on him.   05/29/23 Matthew Salinas is a 57 y.o. male who presents with the chief complaint of ongoing back pain.  He reports he was doing relatively well until Monday of this week when he was moving his camper and felt a pop in his back and worsening mid and lower back pain with numbness down his  right leg.  He denies any focal weakness, bowel or bladder dysfunction.  He has continued the exercises that were given to him in physical therapy and has taken anti-inflammatories.   01/14/23 Matthew Salinas is a 57 year old presenting today via telephone visit.  Overall he reports improvement of his back and buttock pain with physical therapy.  He states he has 1 more session this week.  He feels that his current symptoms are manageable.   12/17/2022 Matthew Salinas is a 57 year old with a history of anxiety, GERD, hypertension, CAD, and hyperlipidemia who is here today with a chief complaint of 6+ months of low back pain with intermittent radiating leg pain.  He states that this started without any particular inciting event and has worsened over the last couple of months.  He describes pain that starts in his lower back and radiates into his buttocks down the posterior lateral aspect of his legs and into his ankle.  Describes his pain as a burning/stinging sensation with associated numbness.  This occurs on both the left and right side interchangeably.  He states it is worse with activity particularly with bending to do things such as gardening.  He has been taking Tylenol, Flexeril, and NSAIDs with minimal relief.  He did see his primary care provider who ordered an MRI of his lumbar spine and prescribed Decadron which she has not taken yet.  He denies any significant  lower extremity weakness.  Review of Systems:  A 10 point review of systems is negative, except for the pertinent positives and negatives detailed in the HPI.  Past Medical History: Past Medical History:  Diagnosis Date   Allergy    GERD (gastroesophageal reflux disease)    Hyperlipidemia    Hypertension    IBS (irritable bowel syndrome)    Traumatic pneumothorax 12/04/2015   Vitamin D deficiency     Past Surgical History: Past Surgical History:  Procedure Laterality Date   CHEST TUBE INSERTION Right 05-15-02   thoracostomy    ESOPHAGOGASTRODUODENOSCOPY (EGD) WITH PROPOFOL N/A 12/15/2020   Procedure: ESOPHAGOGASTRODUODENOSCOPY (EGD) WITH PROPOFOL;  Surgeon: Earline Mayotte, MD;  Location: [facility] ENDOSCOPY;  Service: Endoscopy;  Laterality: N/A;  with possible dilation   TOE SURGERY  2008   right big toe    Allergies: Allergies as of 07/17/2023 - Review Complete 07/17/2023  Allergen Reaction Noted   Lipitor [atorvastatin]  05/16/2014   Prozac [fluoxetine hcl]  07/13/2013   Rosuvastatin  01/25/2019   Simvastatin  05/16/2014    Medications:  Current Outpatient Medications:    celecoxib (CELEBREX) 100 MG capsule, Take 1 tab q 12 hours as needed, Disp: 60 capsule, Rfl: 2   cholecalciferol (VITAMIN D) 1000 UNITS tablet, Take 5,000 Units by mouth daily. , Disp: , Rfl:    co-enzyme Q-10 30 MG capsule, Take 30 mg by mouth 3 (three) times daily., Disp: , Rfl:    famotidine (PEPCID) 20 MG tablet, Take  1 tablet  2 x /day to Prevent Heart burn & Indigestion                                                           /                                       TAKE                                            BY                                              MOUTH (Patient taking differently: Take  1 tablet  2 x /day to Prevent Heart burn & Indigestion as needed), Disp: 180 tablet, Rfl: 3   lidocaine-prilocaine (EMLA) cream, APPLY TOPICALLY AS NEEDED, Disp: 90 g, Rfl: 11   lisinopril-hydrochlorothiazide (ZESTORETIC) 20-25 MG tablet, TAKE 1/2 (ONE-HALF) TABLET BY MOUTH ONCE DAILY FOR BLOOD PRESSURE, Disp: 46 tablet, Rfl: 0   methocarbamol (ROBAXIN-750) 750 MG tablet, Take 1 tablet (750 mg total) by mouth 3 (three) times daily as needed for muscle spasms., Disp: 90 tablet, Rfl: 0   montelukast (SINGULAIR) 10 MG tablet, TAKE 1 TABLET BY MOUTH ONCE DAILY FOR ALLERGIES AND FOR ASTHMA, Disp: 90 tablet, Rfl: 0   Multiple Vitamins-Minerals (MULTIVITAMIN PO), Take by mouth., Disp: , Rfl:    nitroGLYCERIN (NITROGLYN) 2 % ointment, Apply    1/2 "  to Hemorrhoid  every 6 hours  as needed, Disp: 60 g, Rfl: 3   Omega-3 Fatty Acids (FISH OIL PO), Take by mouth., Disp: , Rfl:    PROCTO-MED HC 2.5 % rectal cream, APPLY CREAM  RECTALLY 4 TIMES DAILY, Disp: 56 g, Rfl: 3   rosuvastatin (CRESTOR) 20 MG tablet, TAKE 1 TABLET BY MOUTH ONCE DAILY FOR CHOLESTEROL (Patient taking differently: 10 mg. TAKE 1/2 TABLET(10mg ) BY MOUTH ONCE DAILY FOR CHOLESTEROL), Disp: 90 tablet, Rfl: 0  Social History: Social History   Tobacco Use   Smoking status: Former    Current packs/day: 0.00    Types: Cigarettes    Quit date: 08/05/2001    Years since quitting: 21.9   Smokeless tobacco: Never   Tobacco comments:    Smoked 5-10 cigarettes per day since age 38  Substance Use Topics   Alcohol use: Yes    Alcohol/week: 10.0 standard drinks of alcohol    Types: 10 Standard drinks or equivalent per week   Drug use: No    Family Medical History: Family History  Problem Relation Age of Onset   Cancer Mother        lung   Hyperlipidemia Mother    Hypertension Father    Hyperlipidemia Father    Dementia Brother    Hyperlipidemia Brother    Hyperlipidemia Brother    Hyperlipidemia Brother    Colon cancer Maternal Grandmother    Diabetes Maternal Grandfather     Physical Examination: Vitals:   07/17/23 1255  BP: 138/88    General: Patient is in no apparent distress. Attention to examination is appropriate.  Neck:   Supple.  Full range of motion.  Respiratory: Patient is breathing without any difficulty.   NEUROLOGICAL:     Awake, alert, oriented to person, place, and time.  Speech is clear and fluent.   Cranial Nerves: Pupils equal round and reactive to light.  Facial tone is symmetric.  Facial sensation is symmetric. Shoulder shrug is symmetric. Tongue protrusion is midline.  There is no pronator drift.  Strength: Side Biceps Triceps Deltoid Interossei Grip Wrist Ext. Wrist Flex.  R 5 5 5 5 5 5 5   L 5 5 5 5 5 5 5    Side Iliopsoas  Quads Hamstring PF DF EHL  R 5 5 5 5 5 5   L 5 5 5 5 5 5    Reflexes are 1+ and symmetric at the biceps, triceps, brachioradialis, patella and achilles.   Hoffman's is absent.   Bilateral upper and lower extremity sensation is intact to light touch.    No evidence of dysmetria noted.  Gait is normal.     Medical Decision Making  Imaging: MRI L spine 06/22/2023 Disc levels:   L1-2: Unremarkable   L2-3: Mild central narrowing of the thecal sac and borderline left foraminal stenosis due to short pedicles and mild disc bulge.   L3-4: Prominent central narrowing of the thecal sac and mild bilateral foraminal stenosis along with mild left subarticular lateral recess stenosis due to diffuse disc bulge, small left lateral recess disc protrusion, facet arthropathy, and short pedicles. The cross-sectional area of the thecal sac is 0.3 cm^2 at this level, similar to previous.   L4-5: Prominent central narrowing of the thecal sac with mild to moderate left subarticular lateral recess stenosis due to disc bulge, central disc protrusion, short pedicles, and mild facet arthropathy. Cross-sectional area of the thecal sac is 0.45 cm^2, minimally worsened from previous.   L5-S1: No impingement.  Small central disc protrusion.   IMPRESSION: 1. Lumbar spondylosis and degenerative disc disease, causing prominent impingement at L3-4 and L4-5; and mild impingement at L2-3. Impingement at L4-5 is minimally worsened from 12/10/2022. 2. Congenitally short pedicles in the lumbar spine. 3. Pelvic left kidney partially visualized.     Electronically Signed   By: Gaylyn Rong M.D.   On: 07/12/2023 10:04    I have personally reviewed the images and agree with the above interpretation.  Assessment and Plan: Matthew Salinas is a pleasant 57 y.o. 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  He has tried physical therapy without  improvement.  There is no role for further conservative management.  I recommended that he consider lumbar decompression.  I have recommended L3-5 lumbar decompression.  I discussed the planned procedure at length with the patient, including the risks, benefits, alternatives, and indications. The risks discussed include but are not limited to bleeding, infection, need for reoperation, spinal fluid leak, stroke, vision loss, anesthetic complication, coma, paralysis, and even death. I also described in detail that improvement was not guaranteed.  The patient expressed understanding of these risks, and asked that we proceed with surgery. I described the surgery in layman's terms, and gave ample opportunity for questions, which were answered to the best of my ability.  I spent a total of 30 minutes in this patient's care today. This time was spent reviewing pertinent records including imaging studies, obtaining and confirming history, performing a directed evaluation, formulating and discussing my recommendations, and documenting the visit within the medical record.     Thank you for involving me in the care of this patient.      Matthew Hams K. Myer Haff MD, [facility]

## 2023-07-17 ENCOUNTER — Encounter: Payer: Self-pay | Admitting: Neurosurgery

## 2023-07-17 ENCOUNTER — Ambulatory Visit (INDEPENDENT_AMBULATORY_CARE_PROVIDER_SITE_OTHER): Payer: BC Managed Care – PPO | Admitting: Neurosurgery

## 2023-07-17 ENCOUNTER — Other Ambulatory Visit: Payer: Self-pay

## 2023-07-17 VITALS — BP 138/88 | Ht 71.0 in | Wt 231.0 lb

## 2023-07-17 DIAGNOSIS — M48062 Spinal stenosis, lumbar region with neurogenic claudication: Secondary | ICD-10-CM | POA: Diagnosis not present

## 2023-07-17 DIAGNOSIS — Z01818 Encounter for other preprocedural examination: Secondary | ICD-10-CM

## 2023-07-17 NOTE — Addendum Note (Signed)
Addended by: Sharlot Gowda on: 07/17/2023 01:57 PM   Modules accepted: Orders

## 2023-07-17 NOTE — Patient Instructions (Signed)
Please see below for information in regards to your upcoming surgery:   Planned surgery: L3-5 posterior spinal decompression   Surgery date: 07/28/23 at [facility] (Medical Mall: [address], [place], [place] [address]) - you will find out your arrival time the business day before your surgery.   Pre-op appointment at [facility] Pre-admit Testing: we will call you with a date/time for this. If you are scheduled for an in person appointment, Pre-admit Testing is located on the first floor of the [facility] building, [address], [address]. Please bring all prescriptions in the original prescription bottles to your appointment. During this appointment, they will advise you which medications you can take the morning of surgery, and which medications you will need to hold for surgery. Labs (such as blood work, EKG) may be done at your pre-op appointment. You are not required to fast for these labs. Should you need to change your pre-op appointment, please call Pre-admit testing at [phone].      Surgical clearance: we will send a clearance form to Dr Oneta Rack. They may wish to see you in their office prior to signing the clearance form. If so, they may call you to schedule an appointment.    Common restrictions after surgery: No bending, lifting, or twisting ("BLT"). Avoid lifting objects heavier than 10 pounds for the first 6 weeks after surgery. Where possible, avoid household activities that involve lifting, bending, reaching, pushing, or pulling such as laundry, vacuuming, grocery shopping, and childcare. Try to arrange for help from friends and family for these activities while you heal. Do not drive while taking prescription pain medication. Weeks 6 through 12 after surgery: avoid lifting more than 25 pounds.     How to contact us:  If you have any questions/concerns before or after surgery, you can reach [place] at [phone], or you can send  a mychart message. We can be reached by phone or mychart 8am-4pm, Monday-Friday.  *Please note: Calls after 4pm are forwarded to a third party answering service. Mychart messages are not routinely monitored during evenings, weekends, and holidays. Please call our office to contact the answering service for urgent concerns during non-business hours.   If you have FMLA/disability paperwork, please drop it off or fax it to [phone], attention Patty.   Appointments/FMLA & disability paperwork: Joycelyn Rua, & Flonnie Hailstone Registered Nurse/Surgery scheduler: Royston Cowper Medical Assistants: Nash Mantis Physician Assistants: Joan Flores, PA-C, Manning Charity, PA-C & Drake Leach, PA-C Surgeons: Venetia Night, MD & Ernestine Mcmurray, MD

## 2023-07-22 ENCOUNTER — Other Ambulatory Visit: Payer: Self-pay

## 2023-07-22 ENCOUNTER — Encounter
Admission: RE | Admit: 2023-07-22 | Discharge: 2023-07-22 | Disposition: A | Discharge status: Home / Self Care | Payer: BC Managed Care – PPO | Source: Ambulatory Visit | Attending: Neurosurgery | Admitting: Neurosurgery

## 2023-07-22 VITALS — BP 130/81 | HR 100 | Resp 16 | Ht 71.0 in | Wt 233.2 lb

## 2023-07-22 DIAGNOSIS — Z01818 Encounter for other preprocedural examination: Secondary | ICD-10-CM | POA: Diagnosis present

## 2023-07-22 DIAGNOSIS — Z01812 Encounter for preprocedural laboratory examination: Secondary | ICD-10-CM

## 2023-07-22 HISTORY — DX: Other intervertebral disc degeneration, lumbar region without mention of lumbar back pain or lower extremity pain: M51.369

## 2023-07-22 HISTORY — DX: Spondylosis without myelopathy or radiculopathy, lumbar region: M47.816

## 2023-07-22 HISTORY — DX: COVID-19: U07.1

## 2023-07-22 LAB — TYPE AND SCREEN
ABO/RH(D): A POS
Antibody Screen: NEGATIVE

## 2023-07-22 LAB — SURGICAL PCR SCREEN
MRSA, PCR: NEGATIVE
Staphylococcus aureus: NEGATIVE

## 2023-07-22 NOTE — Patient Instructions (Addendum)
Your procedure is scheduled on: 07/28/23 - Monday Report to the Registration Desk on the 1st floor of the Medical Mall. To find out your arrival time, please call [phone] between 1PM - 3PM on: 07/25/23 - Friday If your arrival time is 6:00 am, do not arrive before that time as the Medical Mall entrance doors do not open until 6:00 am.  REMEMBER: Instructions that are not followed completely may result in serious medical risk, up to and including death; or upon the discretion of your surgeon and anesthesiologist your surgery may need to be rescheduled.  Do not eat food after midnight the night before surgery.  No gum chewing or hard candies.  You may however, drink CLEAR liquids up to 2 hours before you are scheduled to arrive for your surgery. Do not drink anything within 2 hours of your scheduled arrival time.  Clear liquids include: - water  - apple juice without pulp - gatorade (not RED colors) - black coffee or tea (Do NOT add milk or creamers to the coffee or tea) Do NOT drink anything that is not on this list.  You may continue if needed, Anti-inflammatories (NSAIDS) such as Advil, Aleve, Ibuprofen, Motrin, Naproxen, Naprosyn and Aspirin based products such as Excedrin, Goody's Powder, BC Powder.  Stop ANY OVER THE COUNTER supplements until after surgery : COENZYME Omega-3 , Fatty Acids (FISH OIL) ,   You may take Tylenol if needed for pain up until the day of surgery.   ON THE DAY OF SURGERY ONLY TAKE THESE MEDICATIONS WITH SIPS OF WATER:  famotidine (PEPCID)    No Alcohol for 24 hours before or after surgery.  No Smoking including e-cigarettes for 24 hours before surgery.  No chewable tobacco products for at least 6 hours before surgery.  No nicotine patches on the day of surgery.  Do not use any "recreational" drugs for at least a week (preferably 2 weeks) before your surgery.  Please be advised that the combination of cocaine and anesthesia may have negative  outcomes, up to and including death. If you test positive for cocaine, your surgery will be cancelled.  On the morning of surgery brush your teeth with toothpaste and water, you may rinse your mouth with mouthwash if you wish. Do not swallow any toothpaste or mouthwash.  Use CHG Soap or wipes as directed on instruction sheet.  Do not wear jewelry, make-up, hairpins, clips or nail polish.  For welded (permanent) jewelry: bracelets, anklets, waist bands, etc.  Please have this removed prior to surgery.  If it is not removed, there is a chance that hospital personnel will need to cut it off on the day of surgery.  Do not wear lotions, powders, or perfumes.   Do not shave body hair from the neck down 48 hours before surgery.  Contact lenses, hearing aids and dentures may not be worn into surgery.  Do not bring valuables to the hospital. [facility] is not responsible for any missing/lost belongings or valuables.   Notify your doctor if there is any change in your medical condition (cold, fever, infection).  Wear comfortable clothing (specific to your surgery type) to the hospital.  After surgery, you can help prevent lung complications by doing breathing exercises.  Take deep breaths and cough every 1-2 hours. Your doctor may order a device called an Incentive Spirometer to help you take deep breaths. When coughing or sneezing, hold a pillow firmly against your incision with both hands. This is called "splinting." Doing  this helps protect your incision. It also decreases belly discomfort.  If you are being admitted to the hospital overnight, leave your suitcase in the car. After surgery it may be brought to your room.  In case of increased patient census, it may be necessary for you, the patient, to continue your postoperative care in the Same Day Surgery department.  If you are being discharged the day of surgery, you will not be allowed to drive home. You will need a responsible  individual to drive you home and stay with you for 24 hours after surgery.   If you are taking public transportation, you will need to have a responsible individual with you.  Please call the Pre-admissions Testing Dept. at [phone] if you have any questions about these instructions.  Surgery Visitation Policy:  Patients having surgery or a procedure may have two visitors.  Children under the age of 22 must have an adult with them who is not the patient.  Inpatient Visitation:    Visiting hours are 7 a.m. to 8 p.m. Up to four visitors are allowed at one time in a patient room. The visitors may rotate out with other people during the day.  One visitor age 36 or older may stay with the patient overnight and must be in the room by 8 p.m.    Pre-operative 5 CHG Bath Instructions   You can play a key role in reducing the risk of infection after surgery. Your skin needs to be as free of germs as possible. You can reduce the number of germs on your skin by washing with CHG (chlorhexidine gluconate) soap before surgery. CHG is an antiseptic soap that kills germs and continues to kill germs even after washing.   DO NOT use if you have an allergy to chlorhexidine/CHG or antibacterial soaps. If your skin becomes reddened or irritated, stop using the CHG and notify one of our RNs at [phone].   Please shower with the CHG soap starting 4 days before surgery using the following schedule: 12/19 - 12/23.    Please keep in mind the following:  DO NOT shave, including legs and underarms, starting the day of your first shower.   You may shave your face at any point before/day of surgery.  Place clean sheets on your bed the day you start using CHG soap. Use a clean washcloth (not used since being washed) for each shower. DO NOT sleep with pets once you start using the CHG.   CHG Shower Instructions:  If you choose to wash your hair and private area, wash first with your normal  shampoo/soap.  After you use shampoo/soap, rinse your hair and body thoroughly to remove shampoo/soap residue.  Turn the water OFF and apply about 3 tablespoons (45 ml) of CHG soap to a CLEAN washcloth.  Apply CHG soap ONLY FROM YOUR NECK DOWN TO YOUR TOES (washing for 3-5 minutes)  DO NOT use CHG soap on face, private areas, open wounds, or sores.  Pay special attention to the area where your surgery is being performed.  If you are having back surgery, having someone wash your back for you may be helpful. Wait 2 minutes after CHG soap is applied, then you may rinse off the CHG soap.  Pat dry with a clean towel  Put on clean clothes/pajamas   If you choose to wear lotion, please use ONLY the CHG-compatible lotions on the back of this paper.     Additional instructions for the day  of surgery: DO NOT APPLY any lotions, deodorants, cologne, or perfumes.   Put on clean/comfortable clothes.  Brush your teeth.  Ask your nurse before applying any prescription medications to the skin.      CHG Compatible Lotions   Aveeno Moisturizing lotion  Cetaphil Moisturizing Cream  Cetaphil Moisturizing Lotion  Clairol Herbal Essence Moisturizing Lotion, Dry Skin  Clairol Herbal Essence Moisturizing Lotion, Extra Dry Skin  Clairol Herbal Essence Moisturizing Lotion, Normal Skin  Curel Age Defying Therapeutic Moisturizing Lotion with Alpha Hydroxy  Curel Extreme Care Body Lotion  Curel Soothing Hands Moisturizing Hand Lotion  Curel Therapeutic Moisturizing Cream, Fragrance-Free  Curel Therapeutic Moisturizing Lotion, Fragrance-Free  Curel Therapeutic Moisturizing Lotion, Original Formula  Eucerin Daily Replenishing Lotion  Eucerin Dry Skin Therapy Plus Alpha Hydroxy Crme  Eucerin Dry Skin Therapy Plus Alpha Hydroxy Lotion  Eucerin Original Crme  Eucerin Original Lotion  Eucerin Plus Crme Eucerin Plus Lotion  Eucerin TriLipid Replenishing Lotion  Keri Anti-Bacterial Hand Lotion  Keri Deep  Conditioning Original Lotion Dry Skin Formula Softly Scented  Keri Deep Conditioning Original Lotion, Fragrance Free Sensitive Skin Formula  Keri Lotion Fast Absorbing Fragrance Free Sensitive Skin Formula  Keri Lotion Fast Absorbing Softly Scented Dry Skin Formula  Keri Original Lotion  Keri Skin Renewal Lotion Keri Silky Smooth Lotion  Keri Silky Smooth Sensitive Skin Lotion  Nivea Body Creamy Conditioning Oil  Nivea Body Extra Enriched Teacher, adult education Moisturizing Lotion Nivea Crme  Nivea Skin Firming Lotion  NutraDerm 30 Skin Lotion  NutraDerm Skin Lotion  NutraDerm Therapeutic Skin Cream  NutraDerm Therapeutic Skin Lotion  ProShield Protective Hand Cream  Provon moisturizing lotion

## 2023-07-27 MED ORDER — ORAL CARE MOUTH RINSE: 15.0000 mL | Freq: Once | OROMUCOSAL | Status: AC

## 2023-07-27 MED ORDER — CEFAZOLIN SODIUM-DEXTROSE 2-4 GM/100ML-% IV SOLN
2.0000 g | INTRAVENOUS | Status: AC
Start: 2023-07-28 — End: 2023-07-28
  Administered 2023-07-28: 2 g via INTRAVENOUS

## 2023-07-27 MED ORDER — CHLORHEXIDINE GLUCONATE 0.12 % MT SOLN
15.0000 mL | Freq: Once | OROMUCOSAL | Status: AC
  Administered 2023-07-28: 15 mL via OROMUCOSAL

## 2023-07-27 MED ORDER — CEFAZOLIN IN SODIUM CHLORIDE 2-0.9 GM/100ML-% IV SOLN
2.0000 g | Freq: Once | INTRAVENOUS | Status: DC
  Filled 2023-07-27: qty 100

## 2023-07-27 MED ORDER — LACTATED RINGERS IV SOLN
INTRAVENOUS | Status: DC
Start: 2023-07-27 — End: 2023-07-28

## 2023-07-28 ENCOUNTER — Other Ambulatory Visit: Payer: Self-pay

## 2023-07-28 ENCOUNTER — Ambulatory Visit: Payer: BC Managed Care – PPO | Admitting: Anesthesiology

## 2023-07-28 ENCOUNTER — Ambulatory Visit: Payer: BC Managed Care – PPO

## 2023-07-28 ENCOUNTER — Encounter
Admission: RE | Disposition: A | Discharge status: Home / Self Care | Payer: Self-pay | Source: Home / Self Care | Attending: Neurosurgery

## 2023-07-28 ENCOUNTER — Inpatient Hospital Stay
Admission: RE | Admit: 2023-07-28 | Discharge: 2023-07-30 | DRG: 519 | Disposition: A | Discharge status: Home / Self Care | Payer: BC Managed Care – PPO | Attending: Neurosurgery | Admitting: Neurosurgery

## 2023-07-28 ENCOUNTER — Encounter: Payer: Self-pay | Admitting: Neurosurgery

## 2023-07-28 ENCOUNTER — Ambulatory Visit: Payer: BC Managed Care – PPO | Admitting: Urgent Care

## 2023-07-28 DIAGNOSIS — Z8249 Family history of ischemic heart disease and other diseases of the circulatory system: Secondary | ICD-10-CM

## 2023-07-28 DIAGNOSIS — Z888 Allergy status to other drugs, medicaments and biological substances status: Secondary | ICD-10-CM

## 2023-07-28 DIAGNOSIS — Z79899 Other long term (current) drug therapy: Secondary | ICD-10-CM

## 2023-07-28 DIAGNOSIS — K219 Gastro-esophageal reflux disease without esophagitis: Secondary | ICD-10-CM | POA: Diagnosis present

## 2023-07-28 DIAGNOSIS — Z83438 Family history of other disorder of lipoprotein metabolism and other lipidemia: Secondary | ICD-10-CM

## 2023-07-28 DIAGNOSIS — E785 Hyperlipidemia, unspecified: Secondary | ICD-10-CM | POA: Diagnosis present

## 2023-07-28 DIAGNOSIS — Z833 Family history of diabetes mellitus: Secondary | ICD-10-CM

## 2023-07-28 DIAGNOSIS — M48062 Spinal stenosis, lumbar region with neurogenic claudication: Secondary | ICD-10-CM | POA: Diagnosis not present

## 2023-07-28 DIAGNOSIS — E559 Vitamin D deficiency, unspecified: Secondary | ICD-10-CM | POA: Diagnosis present

## 2023-07-28 DIAGNOSIS — G9609 Other spinal cerebrospinal fluid leak: Secondary | ICD-10-CM | POA: Diagnosis present

## 2023-07-28 DIAGNOSIS — I2510 Atherosclerotic heart disease of native coronary artery without angina pectoris: Secondary | ICD-10-CM | POA: Diagnosis present

## 2023-07-28 DIAGNOSIS — Y838 Other surgical procedures as the cause of abnormal reaction of the patient, or of later complication, without mention of misadventure at the time of the procedure: Secondary | ICD-10-CM | POA: Diagnosis present

## 2023-07-28 DIAGNOSIS — Z800 Family history of malignant neoplasm of digestive organs: Secondary | ICD-10-CM

## 2023-07-28 DIAGNOSIS — F419 Anxiety disorder, unspecified: Secondary | ICD-10-CM | POA: Diagnosis present

## 2023-07-28 DIAGNOSIS — I10 Essential (primary) hypertension: Secondary | ICD-10-CM | POA: Diagnosis present

## 2023-07-28 DIAGNOSIS — G9741 Accidental puncture or laceration of dura during a procedure: Secondary | ICD-10-CM | POA: Diagnosis not present

## 2023-07-28 DIAGNOSIS — Z87891 Personal history of nicotine dependence: Secondary | ICD-10-CM

## 2023-07-28 DIAGNOSIS — J45909 Unspecified asthma, uncomplicated: Secondary | ICD-10-CM | POA: Diagnosis present

## 2023-07-28 DIAGNOSIS — Z01818 Encounter for other preprocedural examination: Secondary | ICD-10-CM

## 2023-07-28 DIAGNOSIS — M5440 Lumbago with sciatica, unspecified side: Secondary | ICD-10-CM

## 2023-07-28 HISTORY — PX: LUMBAR LAMINECTOMY/DECOMPRESSION MICRODISCECTOMY: SHX5026

## 2023-07-28 LAB — ABO/RH: ABO/RH(D): A POS

## 2023-07-28 LAB — GLUCOSE, CAPILLARY: Glucose-Capillary: 171 mg/dL — ABNORMAL HIGH (ref 70–99)

## 2023-07-28 SURGERY — LUMBAR LAMINECTOMY/DECOMPRESSION MICRODISCECTOMY 2 LEVELS
Anesthesia: General | Site: Spine Lumbar

## 2023-07-28 MED ORDER — HYDROCHLOROTHIAZIDE 25 MG PO TABS
25.0000 mg | ORAL_TABLET | Freq: Every day | ORAL | Status: DC
  Administered 2023-07-28 – 2023-07-30 (×2): 25 mg via ORAL
  Filled 2023-07-28 (×3): qty 1

## 2023-07-28 MED ORDER — PHENOL 1.4 % MT LIQD: 1.0000 | OROMUCOSAL | Status: DC | PRN

## 2023-07-28 MED ORDER — LIDOCAINE HCL (CARDIAC) PF 100 MG/5ML IV SOSY
PREFILLED_SYRINGE | INTRAVENOUS | Status: DC | PRN
  Administered 2023-07-28: 60 mg via INTRAVENOUS

## 2023-07-28 MED ORDER — ROCURONIUM BROMIDE 100 MG/10ML IV SOLN
INTRAVENOUS | Status: DC | PRN
  Administered 2023-07-28: 20 mg via INTRAVENOUS
  Administered 2023-07-28: 10 mg via INTRAVENOUS
  Administered 2023-07-28 (×2): 20 mg via INTRAVENOUS
  Administered 2023-07-28: 50 mg via INTRAVENOUS
  Administered 2023-07-28: 10 mg via INTRAVENOUS

## 2023-07-28 MED ORDER — FENTANYL CITRATE (PF) 100 MCG/2ML IJ SOLN
INTRAMUSCULAR | Status: DC | PRN
  Administered 2023-07-28 (×2): 50 ug via INTRAVENOUS

## 2023-07-28 MED ORDER — FENTANYL CITRATE (PF) 100 MCG/2ML IJ SOLN
25.0000 ug | INTRAMUSCULAR | Status: DC | PRN
  Administered 2023-07-28 (×4): 25 ug via INTRAVENOUS

## 2023-07-28 MED ORDER — ROSUVASTATIN CALCIUM 10 MG PO TABS
10.0000 mg | ORAL_TABLET | Freq: Every evening | ORAL | Status: DC
  Administered 2023-07-29: 10 mg via ORAL
  Filled 2023-07-28 (×2): qty 1

## 2023-07-28 MED ORDER — LISINOPRIL 20 MG PO TABS
20.0000 mg | ORAL_TABLET | Freq: Every day | ORAL | Status: DC
  Administered 2023-07-28 – 2023-07-30 (×2): 20 mg via ORAL
  Filled 2023-07-28 (×3): qty 1

## 2023-07-28 MED ORDER — ACETAMINOPHEN 650 MG RE SUPP: 650.0000 mg | RECTAL | Status: DC | PRN

## 2023-07-28 MED ORDER — CELECOXIB 200 MG PO CAPS
200.0000 mg | ORAL_CAPSULE | Freq: Two times a day (BID) | ORAL | Status: DC
  Administered 2023-07-28 – 2023-07-30 (×4): 200 mg via ORAL
  Filled 2023-07-28 (×4): qty 1

## 2023-07-28 MED ORDER — SODIUM CHLORIDE (PF) 0.9 % IJ SOLN
INTRAMUSCULAR | Status: DC | PRN
  Administered 2023-07-28: 60 mL via INTRAMUSCULAR

## 2023-07-28 MED ORDER — SUGAMMADEX SODIUM 200 MG/2ML IV SOLN
INTRAVENOUS | Status: DC | PRN
  Administered 2023-07-28: 200 mg via INTRAVENOUS

## 2023-07-28 MED ORDER — LISINOPRIL-HYDROCHLOROTHIAZIDE 20-25 MG PO TABS: 0.5000 | ORAL_TABLET | Freq: Every day | ORAL | Status: DC

## 2023-07-28 MED ORDER — SURGIFLO WITH THROMBIN (HEMOSTATIC MATRIX KIT) OPTIME
TOPICAL | Status: DC | PRN
  Administered 2023-07-28: 1 via TOPICAL

## 2023-07-28 MED ORDER — ONDANSETRON HCL 4 MG/2ML IJ SOLN
INTRAMUSCULAR | Status: AC
  Filled 2023-07-28: qty 2

## 2023-07-28 MED ORDER — ONDANSETRON HCL 4 MG/2ML IJ SOLN
INTRAMUSCULAR | Status: DC | PRN
  Administered 2023-07-28: 4 mg via INTRAVENOUS

## 2023-07-28 MED ORDER — ACETAMINOPHEN 325 MG PO TABS
650.0000 mg | ORAL_TABLET | ORAL | Status: DC | PRN
Start: 2023-07-28 — End: 2023-07-30

## 2023-07-28 MED ORDER — OXYCODONE HCL 5 MG/5ML PO SOLN: 5.0000 mg | Freq: Once | ORAL | Status: AC | PRN

## 2023-07-28 MED ORDER — ROCURONIUM BROMIDE 10 MG/ML (PF) SYRINGE
PREFILLED_SYRINGE | INTRAVENOUS | Status: AC
  Filled 2023-07-28: qty 10

## 2023-07-28 MED ORDER — OXYCODONE HCL 5 MG PO TABS
ORAL_TABLET | ORAL | Status: AC
  Filled 2023-07-28: qty 1

## 2023-07-28 MED ORDER — BUPIVACAINE HCL (PF) 0.5 % IJ SOLN
INTRAMUSCULAR | Status: AC
  Filled 2023-07-28: qty 30

## 2023-07-28 MED ORDER — HYDROMORPHONE HCL 1 MG/ML IJ SOLN
INTRAMUSCULAR | Status: AC
  Filled 2023-07-28: qty 1

## 2023-07-28 MED ORDER — ONDANSETRON HCL 4 MG PO TABS: 4.0000 mg | ORAL_TABLET | Freq: Four times a day (QID) | ORAL | Status: DC | PRN

## 2023-07-28 MED ORDER — MONTELUKAST SODIUM 10 MG PO TABS
10.0000 mg | ORAL_TABLET | Freq: Every morning | ORAL | Status: DC
  Administered 2023-07-29 – 2023-07-30 (×2): 10 mg via ORAL
  Filled 2023-07-28 (×2): qty 1

## 2023-07-28 MED ORDER — BUPIVACAINE LIPOSOME 1.3 % IJ SUSP
INTRAMUSCULAR | Status: AC
  Filled 2023-07-28: qty 20

## 2023-07-28 MED ORDER — LACTATED RINGERS IV SOLN: INTRAVENOUS | Status: DC | PRN

## 2023-07-28 MED ORDER — METHOCARBAMOL 1000 MG/10ML IJ SOLN
INTRAMUSCULAR | Status: AC
  Filled 2023-07-28: qty 10

## 2023-07-28 MED ORDER — SODIUM CHLORIDE 0.9% FLUSH
3.0000 mL | Freq: Two times a day (BID) | INTRAVENOUS | Status: DC
  Administered 2023-07-28: 3 mL via INTRAVENOUS

## 2023-07-28 MED ORDER — PHENYLEPHRINE 80 MCG/ML (10ML) SYRINGE FOR IV PUSH (FOR BLOOD PRESSURE SUPPORT)
PREFILLED_SYRINGE | INTRAVENOUS | Status: DC | PRN
  Administered 2023-07-28: 80 ug via INTRAVENOUS
  Administered 2023-07-28: 160 ug via INTRAVENOUS
  Administered 2023-07-28: 80 ug via INTRAVENOUS

## 2023-07-28 MED ORDER — HYDROCORTISONE (PERIANAL) 2.5 % EX CREA: 1.0000 | TOPICAL_CREAM | Freq: Four times a day (QID) | CUTANEOUS | Status: DC | PRN

## 2023-07-28 MED ORDER — 0.9 % SODIUM CHLORIDE (POUR BTL) OPTIME
TOPICAL | Status: DC | PRN
  Administered 2023-07-28: 500 mL

## 2023-07-28 MED ORDER — HYDROMORPHONE HCL 1 MG/ML IJ SOLN
0.5000 mg | INTRAMUSCULAR | Status: DC | PRN
Start: 2023-07-28 — End: 2023-07-28
  Administered 2023-07-28 (×2): 0.5 mg via INTRAVENOUS

## 2023-07-28 MED ORDER — DEXAMETHASONE SODIUM PHOSPHATE 10 MG/ML IJ SOLN
INTRAMUSCULAR | Status: AC
  Filled 2023-07-28: qty 1

## 2023-07-28 MED ORDER — DOCUSATE SODIUM 100 MG PO CAPS
100.0000 mg | ORAL_CAPSULE | Freq: Two times a day (BID) | ORAL | Status: DC
  Administered 2023-07-30: 100 mg via ORAL
  Filled 2023-07-28 (×3): qty 1

## 2023-07-28 MED ORDER — FENTANYL CITRATE (PF) 100 MCG/2ML IJ SOLN
INTRAMUSCULAR | Status: AC
  Filled 2023-07-28: qty 2

## 2023-07-28 MED ORDER — CHLORHEXIDINE GLUCONATE CLOTH 2 % EX PADS
6.0000 | MEDICATED_PAD | Freq: Every day | CUTANEOUS | Status: DC
  Administered 2023-07-28 – 2023-07-30 (×3): 6 via TOPICAL

## 2023-07-28 MED ORDER — OXYCODONE HCL 5 MG PO TABS
10.0000 mg | ORAL_TABLET | ORAL | Status: DC | PRN
  Administered 2023-07-28 – 2023-07-30 (×14): 10 mg via ORAL
  Filled 2023-07-28 (×13): qty 2

## 2023-07-28 MED ORDER — SODIUM CHLORIDE 0.9% FLUSH: 3.0000 mL | INTRAVENOUS | Status: DC | PRN

## 2023-07-28 MED ORDER — METHOCARBAMOL 500 MG PO TABS
500.0000 mg | ORAL_TABLET | Freq: Four times a day (QID) | ORAL | Status: DC | PRN
  Administered 2023-07-28 – 2023-07-30 (×6): 1000 mg via ORAL
  Filled 2023-07-28 (×8): qty 2

## 2023-07-28 MED ORDER — BUPIVACAINE-EPINEPHRINE (PF) 0.5% -1:200000 IJ SOLN
INTRAMUSCULAR | Status: AC
  Filled 2023-07-28: qty 10

## 2023-07-28 MED ORDER — KCL IN DEXTROSE-NACL 20-5-0.9 MEQ/L-%-% IV SOLN
INTRAVENOUS | Status: DC
  Filled 2023-07-28: qty 1000

## 2023-07-28 MED ORDER — POLYETHYLENE GLYCOL 3350 17 G PO PACK: 17.0000 g | PACK | Freq: Every day | ORAL | Status: DC | PRN

## 2023-07-28 MED ORDER — DROPERIDOL 2.5 MG/ML IJ SOLN: 0.6250 mg | Freq: Once | INTRAMUSCULAR | Status: DC | PRN

## 2023-07-28 MED ORDER — OXYCODONE HCL 5 MG PO TABS
5.0000 mg | ORAL_TABLET | Freq: Once | ORAL | Status: AC | PRN
  Administered 2023-07-28: 5 mg via ORAL

## 2023-07-28 MED ORDER — MIDAZOLAM HCL 2 MG/2ML IJ SOLN
INTRAMUSCULAR | Status: AC
  Filled 2023-07-28: qty 2

## 2023-07-28 MED ORDER — FIBRIN SEALANT 2 ML SINGLE DOSE KIT
PACK | CUTANEOUS | Status: DC | PRN
  Administered 2023-07-28: 2 mL via TOPICAL

## 2023-07-28 MED ORDER — PROPOFOL 10 MG/ML IV BOLUS
INTRAVENOUS | Status: DC | PRN
  Administered 2023-07-28: 200 mg via INTRAVENOUS

## 2023-07-28 MED ORDER — METHYLPREDNISOLONE ACETATE 40 MG/ML IJ SUSP
INTRAMUSCULAR | Status: AC
  Filled 2023-07-28: qty 1

## 2023-07-28 MED ORDER — METHOCARBAMOL 1000 MG/10ML IJ SOLN
500.0000 mg | Freq: Four times a day (QID) | INTRAMUSCULAR | Status: DC | PRN
  Administered 2023-07-28: 500 mg via INTRAVENOUS

## 2023-07-28 MED ORDER — SODIUM CHLORIDE FLUSH 0.9 % IV SOLN
INTRAVENOUS | Status: AC
  Filled 2023-07-28: qty 20

## 2023-07-28 MED ORDER — MORPHINE SULFATE (PF) 2 MG/ML IV SOLN
1.0000 mg | INTRAVENOUS | Status: DC | PRN
  Administered 2023-07-29: 1 mg via INTRAVENOUS
  Filled 2023-07-28: qty 1

## 2023-07-28 MED ORDER — ACETAMINOPHEN 10 MG/ML IV SOLN
1000.0000 mg | Freq: Once | INTRAVENOUS | Status: DC | PRN
Start: 2023-07-28 — End: 2023-07-28
  Administered 2023-07-28: 1000 mg via INTRAVENOUS

## 2023-07-28 MED ORDER — OXYCODONE HCL 5 MG PO TABS
5.0000 mg | ORAL_TABLET | ORAL | Status: DC | PRN
  Filled 2023-07-28 (×2): qty 1

## 2023-07-28 MED ORDER — MIDAZOLAM HCL 2 MG/2ML IJ SOLN
INTRAMUSCULAR | Status: DC | PRN
  Administered 2023-07-28: 2 mg via INTRAVENOUS

## 2023-07-28 MED ORDER — ACETAMINOPHEN 10 MG/ML IV SOLN
INTRAVENOUS | Status: AC
  Filled 2023-07-28: qty 100

## 2023-07-28 MED ORDER — SODIUM CHLORIDE 0.9 % IV SOLN: 250.0000 mL | INTRAVENOUS | Status: DC

## 2023-07-28 MED ORDER — ONDANSETRON HCL 4 MG/2ML IJ SOLN: 4.0000 mg | Freq: Four times a day (QID) | INTRAMUSCULAR | Status: DC | PRN

## 2023-07-28 MED ORDER — FAMOTIDINE 20 MG PO TABS: 20.0000 mg | ORAL_TABLET | Freq: Two times a day (BID) | ORAL | Status: DC | PRN

## 2023-07-28 MED ORDER — PHENYLEPHRINE 80 MCG/ML (10ML) SYRINGE FOR IV PUSH (FOR BLOOD PRESSURE SUPPORT)
PREFILLED_SYRINGE | INTRAVENOUS | Status: AC
  Filled 2023-07-28: qty 10

## 2023-07-28 MED ORDER — PROPOFOL 10 MG/ML IV BOLUS
INTRAVENOUS | Status: AC
  Filled 2023-07-28: qty 20

## 2023-07-28 MED ORDER — CEFAZOLIN SODIUM-DEXTROSE 2-4 GM/100ML-% IV SOLN
INTRAVENOUS | Status: AC
  Filled 2023-07-28: qty 100

## 2023-07-28 MED ORDER — HYDROMORPHONE HCL 1 MG/ML IJ SOLN
INTRAMUSCULAR | Status: DC | PRN
  Administered 2023-07-28 (×4): .5 mg via INTRAVENOUS

## 2023-07-28 MED ORDER — CHLORHEXIDINE GLUCONATE 0.12 % MT SOLN
OROMUCOSAL | Status: AC
  Filled 2023-07-28: qty 15

## 2023-07-28 MED ORDER — BUPIVACAINE-EPINEPHRINE 0.5% -1:200000 IJ SOLN
INTRAMUSCULAR | Status: DC | PRN
  Administered 2023-07-28: 8 mL

## 2023-07-28 MED ORDER — SEVOFLURANE IN SOLN
RESPIRATORY_TRACT | Status: AC
  Filled 2023-07-28: qty 250

## 2023-07-28 MED ORDER — METHYLPREDNISOLONE ACETATE 40 MG/ML IJ SUSP
INTRAMUSCULAR | Status: DC | PRN
  Administered 2023-07-28: 40 mg

## 2023-07-28 MED ORDER — MENTHOL 3 MG MT LOZG: 1.0000 | LOZENGE | OROMUCOSAL | Status: DC | PRN

## 2023-07-28 MED ORDER — ENOXAPARIN SODIUM 40 MG/0.4ML IJ SOSY
40.0000 mg | PREFILLED_SYRINGE | INTRAMUSCULAR | Status: DC
  Administered 2023-07-29 – 2023-07-30 (×2): 40 mg via SUBCUTANEOUS
  Filled 2023-07-28 (×2): qty 0.4

## 2023-07-28 MED ORDER — DEXAMETHASONE SODIUM PHOSPHATE 10 MG/ML IJ SOLN
INTRAMUSCULAR | Status: DC | PRN
  Administered 2023-07-28: 5 mg via INTRAVENOUS

## 2023-07-28 SURGICAL SUPPLY — 45 items
BASIN KIT SINGLE STR (MISCELLANEOUS) ×1 IMPLANT
BUR NEURO DRILL SOFT 3.0X3.8M (BURR) ×1 IMPLANT
CATH LUMBAR HERMETIC 14G (CATHETERS) IMPLANT
CATHETER LUMBAR HERMETIC 14G (CATHETERS) ×1
DERMABOND ADVANCED .7 DNX12 (GAUZE/BANDAGES/DRESSINGS) ×1 IMPLANT
DRAPE C ARM PK CFD 31 SPINE (DRAPES) ×1 IMPLANT
DRAPE LAPAROTOMY 100X77 ABD (DRAPES) ×1 IMPLANT
DRAPE MICROSCOPE SPINE 48X150 (DRAPES) ×1 IMPLANT
DRSG OPSITE POSTOP 3X4 (GAUZE/BANDAGES/DRESSINGS) ×1 IMPLANT
DRSG OPSITE POSTOP 4X6 (GAUZE/BANDAGES/DRESSINGS) IMPLANT
ELECT EZSTD 165MM 6.5IN (MISCELLANEOUS) ×1
ELECT REM PT RETURN 9FT ADLT (ELECTROSURGICAL) ×1
ELECTRODE EZSTD 165MM 6.5IN (MISCELLANEOUS) ×1 IMPLANT
ELECTRODE REM PT RTRN 9FT ADLT (ELECTROSURGICAL) ×1 IMPLANT
GLOVE BIOGEL PI IND STRL 6.5 (GLOVE) ×1 IMPLANT
GLOVE SURG SYN 6.5 ES PF (GLOVE) ×3
GLOVE SURG SYN 6.5 PF PI (GLOVE) ×1 IMPLANT
GLOVE SURG SYN 8.5 E (GLOVE) ×3
GLOVE SURG SYN 8.5 PF PI (GLOVE) ×3 IMPLANT
GOWN SRG LRG LVL 4 IMPRV REINF (GOWNS) ×1 IMPLANT
GOWN SRG XL LVL 3 NONREINFORCE (GOWNS) ×1 IMPLANT
GRAFT DURAGEN MATRIX 1WX1L (Tissue) IMPLANT
GRAFT DURAGEN MATRIX 2WX2L IMPLANT
KIT DRAIN CSF ACCUDRAIN (MISCELLANEOUS) IMPLANT
KIT SPINAL PRONEVIEW (KITS) ×1 IMPLANT
MANIFOLD NEPTUNE II (INSTRUMENTS) ×1 IMPLANT
MARKER SKIN DUAL TIP RULER LAB (MISCELLANEOUS) ×1 IMPLANT
NDL SAFETY ECLIPSE 18X1.5 (NEEDLE) ×1 IMPLANT
NS IRRIG 500ML POUR BTL (IV SOLUTION) ×1 IMPLANT
PACK LAMINECTOMY ARMC (PACKS) ×1 IMPLANT
PATTIES SURGICAL .25X.25 (GAUZE/BANDAGES/DRESSINGS) IMPLANT
PATTIES SURGICAL 1X1 (DISPOSABLE) IMPLANT
SURGIFLO W/THROMBIN 8M KIT (HEMOSTASIS) ×1 IMPLANT
SUT ETHILON 3-0 FS-10 30 BLK (SUTURE) ×1
SUT GORETEX 6.0 TT9 (SUTURE) IMPLANT
SUT NURALON 4 0 TR CR/8 (SUTURE) IMPLANT
SUT SILK 2 0SH CR/8 30 (SUTURE) IMPLANT
SUT STRATA 3-0 15 PS-2 (SUTURE) ×1 IMPLANT
SUT VIC AB 0 CT1 27XCR 8 STRN (SUTURE) ×1 IMPLANT
SUT VIC AB 2-0 CT1 18 (SUTURE) ×1 IMPLANT
SUTURE EHLN 3-0 FS-10 30 BLK (SUTURE) IMPLANT
SYR 30ML LL (SYRINGE) ×2 IMPLANT
SYR 3ML LL SCALE MARK (SYRINGE) ×1 IMPLANT
TRAP FLUID SMOKE EVACUATOR (MISCELLANEOUS) ×1 IMPLANT
WATER STERILE IRR 500ML POUR (IV SOLUTION) IMPLANT

## 2023-07-28 NOTE — Op Note (Signed)
Indications: Mr. 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 The patient tried and failed conservative management, prompting surgical intervention.  Findings: spinal fluid leak at L3/4  Preoperative Diagnosis: Lumbar Stenosis with neurogenic claudication Postoperative Diagnosis: same   EBL: 50 ml IVF:see anesthesia record Drains: lumbar drain placed Disposition: Extubated and Stable to PACU Complications: durotomy  A foley catheter was placed at the end of the case.   Preoperative Note:  Risks of surgery discussed include: infection, bleeding, stroke, coma, death, paralysis, CSF leak, nerve/spinal cord injury, numbness, tingling, weakness, complex regional pain syndrome, recurrent stenosis and/or disc herniation, vascular injury, development of instability, neck/back pain, need for further surgery, persistent symptoms, development of deformity, and the risks of anesthesia. The patient understood these risks and agreed to proceed.  Operative Note:   1. L3-5 lumbar decompression including central laminectomy and left facetectomies including foraminotomies 2. Placement of lumbar drain  The patient was then brought from the preoperative center with intravenous access established.  The patient underwent general anesthesia and endotracheal tube intubation, and was then rotated on the [place] rail top where all pressure points were appropriately padded.  The skin was then thoroughly cleansed.  Perioperative antibiotic prophylaxis was administered.  Sterile prep and drapes were then applied and a timeout was then observed.  C-arm was brought into the field under sterile conditions and under lateral visualization the L4-5 interspace was identified and marked.  The incision was marked on the left and injected with local anesthetic. Once this was complete a 4 cm incision was opened with the use of a #10 blade knife.    The metrx tubes were  sequentially advanced and confirmed in position at L4-5. An 18mm by 60mm tube was locked in place to the bed side attachment.  The microscope was then sterilely brought into the field and muscle creep was hemostased with a bipolar and resected with a pituitary rongeur.  A Bovie extender was then used to expose the spinous process and lamina.  Careful attention was placed to not violate the facet capsule. A 3 mm matchstick drill bit was then used to make a hemi-laminotomy trough until the ligamentum flavum was exposed.  This was extended to the base of the spinous process.  Once this was complete and the underlying ligamentum flavum was visualized, it was dissected with a curette and resected with Kerrison rongeurs.  Extensive ligamentum hypertrophy was noted, requiring a substantial amount of time and care for removal.  The dura was identified and palpated. The kerrison rongeur was then used to remove the medial facet bilaterally until no compression was noted.  A balltip probe was used to confirm decompression of the ipsilateral L5 nerve root.  No CSF leak was noted.  Depo-Medrol was placed on the nerve root.  The wound was copiously irrigated. The tube system was then removed under microscopic visualization and hemostasis was obtained with a bipolar.    After performing the decompression at L4-5, the metrx tubes were sequentially advanced and confirmed in position at L3-4. An 18mm by 60mm tube was locked in place to the bed side attachment.  Fluoroscopy was then removed from the field.  The microscope was then sterilely brought into the field and muscle creep was hemostased with a bipolar and resected with a pituitary rongeur.  A Bovie extender was then used to expose the spinous process and lamina.  Careful attention was placed to not violate the facet capsule. A 3 mm matchstick drill bit  was then used to make a hemi-laminotomy trough until the ligamentum flavum was exposed.  This was extended to the base  of the spinous process and to the contralateral side to remove all the central bone from each side.  Once this was complete and the underlying ligamentum flavum was visualized, it was dissected with a curette and resected with Kerrison rongeurs.   During removal of the ligamentum, a durotomy was made with the 3 mm Kerrison punch.  Unfortunately, multiple nerve rootlets herniated through the dural defect.  This was not possible to reduce through the tubular retractor.  At this point, I made the decision to extend the incision and transition to an open decompression to allow for repair of the durotomy.  At this point, I called my colleague Dr. Ernestine Mcmurray to assist in the case.  An assistant was required for this procedure due to the complexity.  The assistant provided assistance in tissue manipulation and suction, and was required for the successful and safe performance of the procedure. I performed the critical portions of the procedure.  After successfully reducing the nerve rootlets back into the dura, 5-0 Gore-Tex suture was used to reapproximate the dura and running continuous fashion.  A Valsalva was performed.  A small area of leakage was noted.  A muscle patch was used to bolster the repair in that location.  We then augmented closure with Vistaseal.  The decompression was then checked.  There is no residual pressure on the left sided nerve roots. Medial facetectomy and foraminotomy were performed at L3/4 to decompress the thecal sac.   Due to the size of the durotomy, I elected to place a lumbar drain.  Through the top of the incision, I placed the 14-gauge Touhy needle into the thecal sac at the L2-3 level.  I then introduced the lumbar drainage catheter and advanced it to approximately 15 cm length.  This was then tunneled laterally and connected to the collection chamber.  Flow was confirmed.   The fascial layer was reapproximated with the use of a 0 Vicryl suture.  Subcutaneous tissue  layer was reapproximated using 2-0 Vicryl suture.  3-0 nylon was used on the skin.  The incision and lumbar drain were dressed.  Patient was then rotated back to the preoperative bed awakened from anesthesia and taken to recovery all counts are correct in this case.    Jenita Rayfield K. Myer Haff MD

## 2023-07-28 NOTE — Transfer of Care (Signed)
Immediate Anesthesia Transfer of Care Note  Patient: Matthew Salinas  Procedure(s) Performed: L3-5 POSTERIOR SPINAL DECOMPRESSION (Spine Lumbar)  Patient Location: PACU  Anesthesia Type:General  Level of Consciousness: awake and alert   Airway & Oxygen Therapy: Patient Spontanous Breathing and Patient connected to nasal cannula oxygen  Post-op Assessment: Report given to RN and Post -op Vital signs reviewed and stable  Post vital signs: Reviewed and stable  Last Vitals:  Vitals Value Taken Time  BP 155/98 07/28/23 1441  Temp    Pulse 107 07/28/23 1444  Resp 23 07/28/23 1444  SpO2 100 % 07/28/23 1444  Vitals shown include unfiled device data.  Last Pain:  Vitals:   07/28/23 0853  TempSrc: Temporal  PainSc: 0-No pain      Patients Stated Pain Goal: 0 (07/28/23 0853)  Complications: No notable events documented.

## 2023-07-28 NOTE — Anesthesia Procedure Notes (Signed)
Procedure Name: Intubation Date/Time: 07/28/2023 10:19 AM  Performed by: Monico Hoar, CRNAPre-anesthesia Checklist: Patient identified, Patient being monitored, Timeout performed, Emergency Drugs available and Suction available Patient Re-evaluated:Patient Re-evaluated prior to induction Oxygen Delivery Method: Circle system utilized Preoxygenation: Pre-oxygenation with 100% oxygen Induction Type: IV induction Ventilation: Mask ventilation without difficulty Laryngoscope Size: Mac and 4 Grade View: Grade I Tube type: Oral Tube size: 7.0 mm Number of attempts: 1 Airway Equipment and Method: Stylet Placement Confirmation: ETT inserted through vocal cords under direct vision, positive ETCO2 and breath sounds checked- equal and bilateral Secured at: 21 cm Tube secured with: Tape Dental Injury: Teeth and Oropharynx as per pre-operative assessment

## 2023-07-28 NOTE — Interval H&P Note (Signed)
History and Physical Interval Note:  07/28/2023 9:36 AM  Matthew Salinas  has presented today for surgery, with the diagnosis of M48.062 Neurogenic claudication due to lumbar spinal stenosis.  The various methods of treatment have been discussed with the patient and family. After consideration of risks, benefits and other options for treatment, the patient has consented to  Procedure(s): L3-5 POSTERIOR SPINAL DECOMPRESSION (N/A) as a surgical intervention.  The patient's history has been reviewed, patient examined, no change in status, stable for surgery.  I have reviewed the patient's chart and labs.  Questions were answered to the patient's satisfaction.    Heart sounds normal no MRG. Chest Clear to Auscultation Bilaterally.   Odesser Tourangeau

## 2023-07-28 NOTE — Anesthesia Preprocedure Evaluation (Signed)
Anesthesia Evaluation  Patient identified by MRN, date of birth, ID band Patient awake    Reviewed: Allergy & Precautions, H&P , NPO status , Patient's Chart, lab work & pertinent test results, reviewed documented beta blocker date and time   Airway Mallampati: II  TM Distance: >3 FB Neck ROM: full    Dental  (+) Teeth Intact   Pulmonary sleep apnea , former smoker   Pulmonary exam normal        Cardiovascular Exercise Tolerance: Good hypertension, On Medications negative cardio ROS Normal cardiovascular exam Rhythm:regular Rate:Normal     Neuro/Psych   Anxiety      Neuromuscular disease  negative psych ROS   GI/Hepatic Neg liver ROS,GERD  Medicated,,  Endo/Other  negative endocrine ROS    Renal/GU negative Renal ROS  negative genitourinary   Musculoskeletal   Abdominal   Peds  Hematology negative hematology ROS (+)   Anesthesia Other Findings Past Medical History: No date: Allergy No date: COVID-19 No date: DDD (degenerative disc disease), lumbar No date: GERD (gastroesophageal reflux disease) No date: Hyperlipidemia No date: Hypertension No date: IBS (irritable bowel syndrome) No date: Lumbar spondylosis No date: Sleep apnea     Comment:  mouth gaurd 12/04/2015: Traumatic pneumothorax No date: Vitamin D deficiency Past Surgical History: 05/15/2002: CHEST TUBE INSERTION; Right     Comment:  thoracostomy No date: COLONOSCOPY 12/15/2020: ESOPHAGOGASTRODUODENOSCOPY (EGD) WITH PROPOFOL; N/A     Comment:  Procedure: ESOPHAGOGASTRODUODENOSCOPY (EGD) WITH               PROPOFOL;  Surgeon: Earline Mayotte, MD;  Location:               [facility] ENDOSCOPY;  Service: Endoscopy;  Laterality: N/A;                with possible dilation 08/05/2006: TOE SURGERY     Comment:  right big toe   Reproductive/Obstetrics negative OB ROS                             Anesthesia Physical Anesthesia  Plan  ASA: 3  Anesthesia Plan: General ETT   Post-op Pain Management:    Induction:   PONV Risk Score and Plan: 3  Airway Management Planned:   Additional Equipment:   Intra-op Plan:   Post-operative Plan:   Informed Consent: I have reviewed the patients History and Physical, chart, labs and discussed the procedure including the risks, benefits and alternatives for the proposed anesthesia with the patient or authorized representative who has indicated his/her understanding and acceptance.     Dental Advisory Given  Plan Discussed with: CRNA  Anesthesia Plan Comments:        Anesthesia Quick Evaluation

## 2023-07-29 ENCOUNTER — Encounter: Payer: Self-pay | Admitting: Neurosurgery

## 2023-07-29 DIAGNOSIS — Z833 Family history of diabetes mellitus: Secondary | ICD-10-CM | POA: Diagnosis not present

## 2023-07-29 DIAGNOSIS — Z888 Allergy status to other drugs, medicaments and biological substances status: Secondary | ICD-10-CM | POA: Diagnosis not present

## 2023-07-29 DIAGNOSIS — Y838 Other surgical procedures as the cause of abnormal reaction of the patient, or of later complication, without mention of misadventure at the time of the procedure: Secondary | ICD-10-CM | POA: Diagnosis present

## 2023-07-29 DIAGNOSIS — J45909 Unspecified asthma, uncomplicated: Secondary | ICD-10-CM | POA: Diagnosis present

## 2023-07-29 DIAGNOSIS — Z79899 Other long term (current) drug therapy: Secondary | ICD-10-CM | POA: Diagnosis not present

## 2023-07-29 DIAGNOSIS — E559 Vitamin D deficiency, unspecified: Secondary | ICD-10-CM | POA: Diagnosis present

## 2023-07-29 DIAGNOSIS — Z87891 Personal history of nicotine dependence: Secondary | ICD-10-CM | POA: Diagnosis not present

## 2023-07-29 DIAGNOSIS — Z8249 Family history of ischemic heart disease and other diseases of the circulatory system: Secondary | ICD-10-CM | POA: Diagnosis not present

## 2023-07-29 DIAGNOSIS — G9741 Accidental puncture or laceration of dura during a procedure: Secondary | ICD-10-CM | POA: Diagnosis not present

## 2023-07-29 DIAGNOSIS — K219 Gastro-esophageal reflux disease without esophagitis: Secondary | ICD-10-CM | POA: Diagnosis present

## 2023-07-29 DIAGNOSIS — G9609 Other spinal cerebrospinal fluid leak: Secondary | ICD-10-CM | POA: Diagnosis present

## 2023-07-29 DIAGNOSIS — Z800 Family history of malignant neoplasm of digestive organs: Secondary | ICD-10-CM | POA: Diagnosis not present

## 2023-07-29 DIAGNOSIS — F419 Anxiety disorder, unspecified: Secondary | ICD-10-CM | POA: Diagnosis present

## 2023-07-29 DIAGNOSIS — M48062 Spinal stenosis, lumbar region with neurogenic claudication: Secondary | ICD-10-CM | POA: Diagnosis present

## 2023-07-29 DIAGNOSIS — Z83438 Family history of other disorder of lipoprotein metabolism and other lipidemia: Secondary | ICD-10-CM | POA: Diagnosis not present

## 2023-07-29 DIAGNOSIS — E785 Hyperlipidemia, unspecified: Secondary | ICD-10-CM | POA: Diagnosis present

## 2023-07-29 DIAGNOSIS — I10 Essential (primary) hypertension: Secondary | ICD-10-CM | POA: Diagnosis present

## 2023-07-29 DIAGNOSIS — I2510 Atherosclerotic heart disease of native coronary artery without angina pectoris: Secondary | ICD-10-CM | POA: Diagnosis present

## 2023-07-29 LAB — BASIC METABOLIC PANEL
Anion gap: 10 (ref 5–15)
BUN: 13 mg/dL (ref 6–20)
CO2: 25 mmol/L (ref 22–32)
Calcium: 8.7 mg/dL — ABNORMAL LOW (ref 8.9–10.3)
Chloride: 100 mmol/L (ref 98–111)
Creatinine, Ser: 0.77 mg/dL (ref 0.61–1.24)
GFR, Estimated: >60 mL/min (ref >60)
Glucose, Bld: 125 mg/dL — ABNORMAL HIGH (ref 70–99)
Potassium: 3.9 mmol/L (ref 3.5–5.1)
Sodium: 135 mmol/L (ref 135–145)

## 2023-07-29 LAB — OSMOLALITY, URINE: Osmolality, Ur: 235 mosm/kg — ABNORMAL LOW (ref 300–900)

## 2023-07-29 LAB — GLUCOSE, CAPILLARY
Glucose-Capillary: 101 mg/dL — ABNORMAL HIGH (ref 70–99)
Glucose-Capillary: 112 mg/dL — ABNORMAL HIGH (ref 70–99)

## 2023-07-29 MED ORDER — ALPRAZOLAM 0.5 MG PO TABS
0.5000 mg | ORAL_TABLET | Freq: Two times a day (BID) | ORAL | Status: DC | PRN
  Administered 2023-07-29 (×2): 0.5 mg via ORAL
  Administered 2023-07-30: 0.25 mg via ORAL
  Filled 2023-07-29 (×3): qty 1

## 2023-07-29 MED ORDER — SODIUM CHLORIDE 0.9% FLUSH: 3.0000 mL | INTRAVENOUS | Status: DC | PRN

## 2023-07-29 MED ORDER — SODIUM CHLORIDE 0.9% FLUSH
3.0000 mL | Freq: Two times a day (BID) | INTRAVENOUS | Status: DC
  Administered 2023-07-29: 3 mL via INTRAVENOUS
  Administered 2023-07-29 – 2023-07-30 (×2): 10 mL via INTRAVENOUS

## 2023-07-29 MED ORDER — OXYCODONE HCL 10 MG PO TABS: 10.0000 mg | ORAL_TABLET | ORAL | 0 refills | Status: AC | PRN

## 2023-07-29 MED ORDER — ALPRAZOLAM 0.5 MG PO TABS: 0.5000 mg | ORAL_TABLET | Freq: Two times a day (BID) | ORAL | 0 refills | Status: AC | PRN

## 2023-07-29 NOTE — Anesthesia Postprocedure Evaluation (Signed)
Anesthesia Post Note  Patient: Matthew Salinas  Procedure(s) Performed: L3-5 POSTERIOR SPINAL DECOMPRESSION (Spine Lumbar)  Patient location during evaluation: SICU Anesthesia Type: General Level of consciousness: awake Vital Signs Assessment: post-procedure vital signs reviewed and stable Respiratory status: spontaneous breathing Cardiovascular status: stable Anesthetic complications: no   No notable events documented.   Last Vitals:  Vitals:   07/29/23 0500 07/29/23 0600  BP: 126/75 122/70  Pulse: 80 80  Resp: 13 15  Temp:    SpO2: 99% 97%    Last Pain:  Vitals:   07/29/23 0610  TempSrc:   PainSc: 5                  Jules Schick

## 2023-07-29 NOTE — Plan of Care (Signed)

## 2023-07-29 NOTE — Plan of Care (Signed)
  Problem: Education: Goal: Knowledge of General Education information will improve Description: Including pain rating scale, medication(s)/side effects and non-pharmacologic comfort measures Outcome: Progressing   Problem: Health Behavior/Discharge Planning: Goal: Ability to manage health-related needs will improve Outcome: Progressing   Problem: Clinical Measurements: Goal: Ability to maintain clinical measurements within normal limits will improve Outcome: Progressing   Problem: Nutrition: Goal: Adequate nutrition will be maintained Outcome: Progressing   Problem: Safety: Goal: Ability to remain free from injury will improve Outcome: Progressing   Problem: Skin Integrity: Goal: Risk for impaired skin integrity will decrease Outcome: Progressing   

## 2023-07-29 NOTE — Progress Notes (Signed)
   Neurosurgery Progress Note  History: Matthew Salinas is here for neurogenic claudication.  There was a significant durotomy during surgery repaired primarily but necessitating a lumbar drain.  POD1: Expected headache overnight.  No issues. On bedrest.  Physical Exam: Vitals:   07/29/23 0700 07/29/23 0800  BP: 130/72 113/65  Pulse: 78 81  Resp: 16 15  Temp:  98 F (36.7 C)  SpO2: 98% 99%    AA Ox3 CNI  Strength:5/5 throughout BLE Continued numbness  Data:  Other tests/results: n/a  Assessment/Plan:  [name] is on bedrest after durotomy repair.  - mobilize - pain control - DVT prophylaxis - PTOT after mobilizng - xanax for sleep    Venetia Night MD, [facility] Department of Neurosurgery

## 2023-07-30 NOTE — Discharge Summary (Signed)
Physician Discharge Summary  Patient ID: Matthew Salinas MRN: [id number] DOB/AGE: 1966/02/16 57 y.o.  Admit date: 07/28/2023 Discharge date: 07/30/2023  Admission Diagnoses: Neurogenic claudication due to lumbar spinal stenosis    Discharge Diagnoses:  Principal Problem:   Neurogenic claudication due to lumbar spinal stenosis   Discharged Condition: fair  Hospital Course: Patient was brought to the operating room for elective lumbar decompression on 07/28/2023, there was a spinal fluid leak intraoperatively therefore a direct dural repair was performed and a lumbar drain was placed.  He was admitted to the intensive care unit for drain management.  He had intermittent lumbar drain management over the course of postoperative day 0 on postoperative day 1.  On postoperative day 2 which was 07/30/2023 he was doing well.  He had his baseline numbness in the bilateral lower extremities.  Overall his strength was at baseline.  We removed his lumbar drain without any complication, the exit site was oversewn and a sterile dressing was reapplied.  At this point patient was enthusiastic about going home.  We discussed signs and symptoms to call for and outlined this in his discharge instructions.   Significant Diagnostic Studies:  No new diagnostic studies during this admission   Treatments:  07/28/2023 Procedure(s) (LRB): L3-5 POSTERIOR SPINAL DECOMPRESSION (N/A)  Discharge Exam: Blood pressure 118/80, pulse 75, temperature 98.3 F (36.8 C), temperature source Oral, resp. rate 18, height 5\' 11"  (1.803 m), weight 112 kg, SpO2 97%. General appearance: alert, cooperative, and appears stated age Back: Dressings are clean dry and intact.  No evidence of significant drainage.  Tender to palpation as expected postoperatively. Neurologic: Mental status: Alert, oriented, thought content appropriate Sensory: normal, at his baseline which includes left lower extremity numbness as well as  proximal right lower extremity numbness Motor: Lower extremities bilaterally are at least antigravity strength throughout.  He does have some limitation secondary to pain.  However he does not appear to have any clear gross deficits.   Incision/Wound: Dressings are covering his surgical wound.  Clean and dry without any active drainage.  Drain site was observed, stitch placed and dry without any active leakage.  Disposition: Discharge disposition: 01-Home or Self Care       Discharge Instructions     Diet general   Complete by: As directed    Incentive spirometry RT   Complete by: As directed    Increase activity slowly   Complete by: As directed       Allergies as of 07/30/2023       Reactions   Lipitor [atorvastatin]    Myalgias   Prozac [fluoxetine Hcl] Other (See Comments)   Did not like the way it made him feel/think   Rosuvastatin    myalgias   Simvastatin    Myalgias        Medication List     TAKE these medications    acetaminophen 500 MG tablet Commonly known as: TYLENOL Take 500-1,000 mg by mouth every 6 (six) hours as needed (pain).   ALPRAZolam 0.5 MG tablet Commonly known as: XANAX Take 1 tablet (0.5 mg total) by mouth 2 (two) times daily as needed for up to 7 days for anxiety or sleep.   celecoxib 100 MG capsule Commonly known as: CELEBREX Take 1 tab q 12 hours as needed   COENZYME Q10 PO Take 1 capsule by mouth in the morning.   famotidine 20 MG tablet Commonly known as: PEPCID Take  1 tablet  2 x /day to Prevent  Heart burn & Indigestion                                                           /                                       TAKE                                            BY                                              MOUTH What changed:  how much to take how to take this when to take this reasons to take this additional instructions   FISH OIL PO Take 1 capsule by mouth in the morning.   ibuprofen 200 MG tablet Commonly  known as: ADVIL Take 200-400 mg by mouth every 8 (eight) hours as needed (pain.).   lidocaine-prilocaine cream Commonly known as: EMLA APPLY TOPICALLY AS NEEDED   lisinopril-hydrochlorothiazide 20-25 MG tablet Commonly known as: ZESTORETIC TAKE 1/2 (ONE-HALF) TABLET BY MOUTH ONCE DAILY FOR BLOOD PRESSURE   methocarbamol 750 MG tablet Commonly known as: Robaxin-750 Take 1 tablet (750 mg total) by mouth 3 (three) times daily as needed for muscle spasms.   montelukast 10 MG tablet Commonly known as: SINGULAIR TAKE 1 TABLET BY MOUTH ONCE DAILY FOR ALLERGIES AND FOR ASTHMA What changed:  how much to take how to take this when to take this   MULTIVITAMIN PO Take 1 tablet by mouth in the morning.   nitroGLYCERIN 2 % ointment Commonly known as: NITROGLYN Apply   1/2 "  to Hemorrhoid  every 6 hours  as needed   Oxycodone HCl 10 MG Tabs Take 1 tablet (10 mg total) by mouth every 4 (four) hours as needed for up to 7 days for severe pain (pain score 7-10).   Procto-Med HC 2.5 % rectal cream Generic drug: hydrocortisone APPLY CREAM  RECTALLY 4 TIMES DAILY What changed: See the new instructions.   rosuvastatin 20 MG tablet Commonly known as: CRESTOR TAKE 1 TABLET BY MOUTH ONCE DAILY FOR CHOLESTEROL What changed:  how much to take how to take this when to take this additional instructions   VITAMIN D PO Take 1 tablet by mouth in the morning.        Follow-up Information     Venetia Night, MD Follow up.   Specialty: Neurosurgery Contact information: [address] [place] [address] [phone]                 Signed: Lovenia Kim 07/30/2023, 1:12 PM

## 2023-07-30 NOTE — Discharge Instructions (Addendum)
Your surgeon has performed an operation on your lumbar spine (low back) to relieve pressure on one or more nerves. Many times, patients feel better immediately after surgery and can "overdo it." Even if you feel well, it is important that you follow these activity guidelines. If you do not let your back heal properly from the surgery, you can increase the chance of a disc herniation and/or return of your symptoms. The following are instructions to help in your recovery once you have been discharged from the hospital.  * It is ok to take NSAIDs after surgery.  Activity    No bending, lifting, or twisting ("BLT"). Avoid lifting objects heavier than 10 pounds (gallon milk jug).  Where possible, avoid household activities that involve lifting, bending, pushing, or pulling such as laundry, vacuuming, grocery shopping, and childcare. Try to arrange for help from friends and family for these activities while your back heals.  You had a spinal fluid leak as we discussed, should you get headaches we encourage caffeine intake and laying flat until the headaches resolved.  Let us know if you notice any leaking from the stitch site to the right of your incision.  Increase physical activity slowly as tolerated.  Taking short walks is encouraged, but avoid strenuous exercise. Do not jog, run, bicycle, lift weights, or participate in any other exercises unless specifically allowed by your doctor. Avoid prolonged sitting, including car rides.  Talk to your doctor before resuming sexual activity.  You should not drive until cleared by your doctor.  Until released by your doctor, you should not return to work or school.  You should rest at home and let your body heal.   You may shower three days after your surgery.  After showering, lightly dab your incision dry. Do not take a tub bath or go swimming for 3 weeks, or until approved by your doctor at your follow-up appointment.  If you smoke, we strongly recommend  that you quit.  Smoking has been proven to interfere with normal healing in your back and will dramatically reduce the success rate of your surgery. Please contact [facility] (800-QUIT-NOW) and use the resources at www.[facility].com for assistance in stopping smoking.  Surgical Incision   If you have a dressing on your incision, you may remove it three days after your surgery. Keep your incision area clean and dry.   If you have staples or stitches on your incision, you should have a follow up scheduled for removal. If you do not have staples or stitches, you will have steri-strips (small pieces of surgical tape) or Dermabond glue. The steri-strips/glue should begin to peel away within about a week (it is fine if the steri-strips fall off before then). If the strips are still in place one week after your surgery, you may gently remove them.  Diet            You may return to your usual diet. Be sure to stay hydrated.  When to Contact us  Although your surgery and recovery will likely be uneventful, you may have some residual numbness, aches, and pains in your back and/or legs. This is normal and should improve in the next few weeks.  However, should you experience any of the following, contact us immediately: New numbness or weakness Pain that is progressively getting worse, and is not relieved by your pain medications or rest Bleeding, redness, swelling, pain, or drainage from surgical incision Chills or flu-like symptoms Fever greater than 101.0 F (38.3 C)  Problems with bowel or bladder functions Difficulty breathing or shortness of breath Warmth, tenderness, or swelling in your calf  Contact Information How to contact us:  If you have any questions/concerns before or after surgery, you can reach [place] at [phone], or you can send a mychart message. We can be reached by phone or mychart 8am-4pm, Monday-Friday.  *Please note: Calls after 4pm are forwarded to a third party answering  service. Mychart messages are not routinely monitored during evenings, weekends, and holidays. Please call our office to contact the answering service for urgent concerns during non-business hours.

## 2023-07-30 NOTE — Progress Notes (Signed)
Pt has discharge orders and is alert and oriented x4.  RN discussed possibly staying another night to await delivery of a walker and back brace.  Pt has difficulty taking steps and will have difficulty negotiating steps up stairs in home.  Pt verbalized having family to assist in home and wished to discharge anyway.  RN discussed discharge medications and orders with the pt and his significant other.  Pt was offered the opportunity to raise questions and have them answered.

## 2023-07-31 ENCOUNTER — Telehealth: Payer: Self-pay | Admitting: Neurosurgery

## 2023-07-31 ENCOUNTER — Ambulatory Visit
Admission: RE | Admit: 2023-07-31 | Discharge: 2023-07-31 | Disposition: A | Discharge status: Home / Self Care | Payer: BC Managed Care – PPO | Source: Ambulatory Visit | Attending: Neurosurgery | Admitting: Neurosurgery

## 2023-07-31 DIAGNOSIS — R300 Dysuria: Secondary | ICD-10-CM

## 2023-07-31 DIAGNOSIS — M48062 Spinal stenosis, lumbar region with neurogenic claudication: Secondary | ICD-10-CM

## 2023-07-31 LAB — URINALYSIS, COMPLETE (UACMP) WITH MICROSCOPIC
Bacteria, UA: NONE SEEN
Bilirubin Urine: NEGATIVE
Glucose, UA: NEGATIVE mg/dL
Hgb urine dipstick: NEGATIVE
Ketones, ur: NEGATIVE mg/dL
Leukocytes,Ua: NEGATIVE
Nitrite: NEGATIVE
Protein, ur: NEGATIVE mg/dL
RBC / HPF: 0 RBC/hpf (ref 0–5)
Specific Gravity, Urine: 1.011 (ref 1.005–1.030)
pH: 6 (ref 5.0–8.0)

## 2023-07-31 MED ORDER — TAMSULOSIN HCL 0.4 MG PO CAPS: 0.4000 mg | ORAL_CAPSULE | Freq: Every day | ORAL | 0 refills | Status: AC

## 2023-07-31 NOTE — Telephone Encounter (Signed)
I spoke with Matthew Salinas. She reports the catheter was removed yesterday at 5pm. He was having burning yesterday with the foley in. Today, he feels like he is not completely emptying his bladder. It dribbles out, but not every time. When he stands up, sometimes urine comes out. He can tell he needs to urinate. He denies numbness in his private area. What he consumes, comes out. There is a strong smell to his urine.  In regards to constipation, his last bowel movement was Monday. They tried miralax this morning and stool softeners since yesterday. He is passing gas. She will pick up a bottle of magnesium citrate. Once he has a bowel movement, he will take a stool softener regularly while taking pain medication. Matthew Salinas is aware of red flags that warrant emergent evaluation.

## 2023-07-31 NOTE — Telephone Encounter (Addendum)
I notified Matthew Salinas. They declined for now, but will call in the morning if they change their mind.  She inquired about incision care and ways to avoid infection and signs of infection. I discussed this with her and answered all questions. If they have any concerns at any point, she will send a mychart message with a picture.

## 2023-07-31 NOTE — Telephone Encounter (Addendum)
[name] calling back. The UA results are in his chart. She thinks they seem normal. She feels that he is probably just irritated from having the foley in. He is urinating fine. Maybe flomax or AZO would help? She is aware that Dr.Smith is in the OR and will be a few hours before he can review the results and recommend treatment.

## 2023-07-31 NOTE — Telephone Encounter (Signed)
Per discussion w/ Dr Katrinka Blazing, STAT UA has been ordered. Will reevaluate plan depending on results of UA. I notified [name]; she agrees with this plan. Will follow up once we have the UA results.

## 2023-07-31 NOTE — Telephone Encounter (Signed)
Matthew Salinas has been informed that Mr Depaola's UA is normal. I will discuss with Dr Katrinka Blazing and/or Duwayne Heck regarding next steps.

## 2023-07-31 NOTE — Telephone Encounter (Signed)
Per Dr Katrinka Blazing, can send rx for flomax daily for 7 days. Rx has been sent. I spoke with [name] and informed her that his UA did not show signs of a UTI. They will call us back if the flomax doesn't help.

## 2023-07-31 NOTE — Telephone Encounter (Signed)
L3-5 posterior spinal decompression on 07/28/23  Patient was discharged yesterday, he is having issues with constipation. She has given him Miralax. Is there anything else that you can recommend? She has not given him pain medication since he left the hospital because she does not want him to strain himself to have a bowel movement.  She has noticed that he is also dribbling when he urinates. She is not sure if he has developed a UTI. There is no strong odor to his urine or dark color. Could this be because of the catheter?

## 2023-08-11 ENCOUNTER — Ambulatory Visit (INDEPENDENT_AMBULATORY_CARE_PROVIDER_SITE_OTHER): Payer: Self-pay | Admitting: Physician Assistant

## 2023-08-11 VITALS — BP 136/84 | Temp 98.2°F | Ht 71.0 in | Wt 246.0 lb

## 2023-08-11 DIAGNOSIS — M48062 Spinal stenosis, lumbar region with neurogenic claudication: Secondary | ICD-10-CM

## 2023-08-11 DIAGNOSIS — Z09 Encounter for follow-up examination after completed treatment for conditions other than malignant neoplasm: Secondary | ICD-10-CM

## 2023-08-11 MED ORDER — METHOCARBAMOL 750 MG PO TABS: 750.0000 mg | ORAL_TABLET | Freq: Three times a day (TID) | ORAL | 0 refills | Status: DC | PRN

## 2023-08-11 NOTE — Progress Notes (Signed)
   REFERRING PHYSICIAN:  Tonita Fallow, Md [address] [place],  [place] [address]  DOS: 07/28/23, L3-5 lumbar decompression and CSF leak repair  HISTORY OF PRESENT ILLNESS: Matthew Salinas is approximately 2 weeks status post L3-5 decompression and CSF leak repair. he is doing well overall.  He is looking forward to being more active in the future.  He has some increased pain when trying to push off of his left foot with stairs.  He also has some continued numbness in his rectum as well as.  He denies any new weakness, numbness or tingling.       PHYSICAL EXAMINATION:  General: Patient is well developed, well nourished, calm, collected, and in no apparent distress.   NEUROLOGICAL:  General: In no acute distress.   Awake, alert, oriented to person, place, and time.  Pupils equal round and reactive to light.  Facial tone is symmetric.    Strength:            Side Iliopsoas Quads Hamstring PF DF EHL  R 5 5 5 5 5 5   L 5 5 5 5 5 5    Incision c/d/I.  Sutures removed without issue.   ROS (Neurologic):  Negative except as noted above  IMAGING: No new imaging today.  ASSESSMENT/PLAN:  Matthew Salinas is approximately 2 weeks status post L3-5 decompression and CSF leak repair. he is doing well overall.  He is looking forward to being more active in the future.  He has some increased pain when trying to push off of his left foot with stairs.  He also has some continued numbness in his rectum as well as.  He denies any new weakness, numbness or tingling.  Patient also complains of pelvic floor tightness.  We did discuss seeing a urologist in the future.  We will hold on the referral for the time being, but happy to place this in the future if the patient wishes.   I have advised the patient to lift up to 10 pounds until 6 weeks after surgery, then increase up to 25 pounds until 12 weeks after surgery.  After 12 weeks post-op, the patient advised to increase  activity as tolerated.  Advised to contact the office if any questions or concerns arise.  Lyle Decamp PA-C Department of neurosurgery

## 2023-08-12 ENCOUNTER — Telehealth: Payer: Self-pay

## 2023-08-12 ENCOUNTER — Other Ambulatory Visit: Payer: Self-pay | Admitting: Neurosurgery

## 2023-08-12 MED ORDER — OXYCODONE HCL 5 MG PO TABS: 5.0000 mg | ORAL_TABLET | ORAL | 0 refills | Status: AC | PRN

## 2023-08-12 MED ORDER — OXYCODONE HCL 5 MG PO TABS: 5.0000 mg | ORAL_TABLET | ORAL | 0 refills | Status: DC | PRN

## 2023-08-12 NOTE — Telephone Encounter (Signed)
 Jon (pt's spouse) contacted me requesting a refill of oxycodone . Previous rx was 10mg , but he has only been taking half a pill every 6-8 hours and has 5.5 tablets left. They would like to switch to 5mg  tablets.  She is hoping to pick this up today.  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

## 2023-08-12 NOTE — Progress Notes (Signed)
 Meds refilled.

## 2023-08-12 NOTE — Telephone Encounter (Addendum)
 Oxycodone has been approved through 09/11/23. Approval has been faxed back to [facility].

## 2023-08-12 NOTE — Telephone Encounter (Signed)
 I notified [name].

## 2023-08-12 NOTE — Telephone Encounter (Signed)
 Received authorization request from [employer] for oxycodone. An urgent request has been submitted and is pending review.  (KeyFidela Salisbury) PA Case ID #: [id number] Rx #: [id number]

## 2023-08-18 ENCOUNTER — Other Ambulatory Visit: Payer: Self-pay | Admitting: Physician Assistant

## 2023-08-18 ENCOUNTER — Encounter: Payer: Self-pay | Admitting: Neurosurgery

## 2023-08-18 DIAGNOSIS — R339 Retention of urine, unspecified: Secondary | ICD-10-CM

## 2023-08-18 DIAGNOSIS — M6289 Other specified disorders of muscle: Secondary | ICD-10-CM

## 2023-08-25 ENCOUNTER — Encounter: Payer: Self-pay | Admitting: Urology

## 2023-08-25 ENCOUNTER — Ambulatory Visit: Payer: 59 | Admitting: Urology

## 2023-08-25 ENCOUNTER — Telehealth: Payer: Self-pay | Admitting: *Deleted

## 2023-08-25 VITALS — BP 110/79 | HR 99 | Ht 71.0 in | Wt 225.0 lb

## 2023-08-25 DIAGNOSIS — N411 Chronic prostatitis: Secondary | ICD-10-CM

## 2023-08-25 DIAGNOSIS — R339 Retention of urine, unspecified: Secondary | ICD-10-CM

## 2023-08-25 DIAGNOSIS — R102 Pelvic and perineal pain: Secondary | ICD-10-CM

## 2023-08-25 LAB — URINALYSIS, COMPLETE
Bilirubin, UA: NEGATIVE
Glucose, UA: NEGATIVE
Ketones, UA: NEGATIVE
Leukocytes,UA: NEGATIVE
Nitrite, UA: NEGATIVE
Protein,UA: NEGATIVE
RBC, UA: NEGATIVE
Specific Gravity, UA: 1.015 (ref 1.005–1.030)
Urobilinogen, Ur: 0.2 mg/dL (ref 0.2–1.0)
pH, UA: 5.5 (ref 5.0–7.5)

## 2023-08-25 LAB — MICROSCOPIC EXAMINATION: Bacteria, UA: NONE SEEN

## 2023-08-25 LAB — BLADDER SCAN AMB NON-IMAGING: Scan Result: 673

## 2023-08-25 MED ORDER — OXYBUTYNIN CHLORIDE ER 5 MG PO TB24: 5.0000 mg | ORAL_TABLET | Freq: Every day | ORAL | 1 refills | Status: DC

## 2023-08-25 MED ORDER — GEMTESA 75 MG PO TABS: 75.0000 mg | ORAL_TABLET | Freq: Every day | ORAL | Status: DC

## 2023-08-25 MED ORDER — TAMSULOSIN HCL 0.4 MG PO CAPS: 0.4000 mg | ORAL_CAPSULE | Freq: Every day | ORAL | 11 refills | Status: DC

## 2023-08-25 NOTE — Progress Notes (Signed)
I, [name], acting as a scribe for Riki Altes, MD., have documented all relevant documentation on the behalf of Riki Altes, MD, as directed by Riki Altes, MD while in the presence of Riki Altes, MD.  08/25/2023 3:21 PM   Matthew Salinas March 29, 1966 [id number]  Referring provider: Lucky Cowboy, MD [address] [place],  [place] [address]  Chief Complaint  Patient presents with   Prostatitis    HPI: Matthew Salinas is a 58 y.o. male referred for evaluation of lower urinary tract symptoms and scrotal pain.  Status post L3-5 lumbar decompression 07/28/23 for lumbar stenosis with neurogenic claudication. Foley catheter placed at the completion of the procedure. Since the procedure, he has had urinary frequency, urgency with urge incontinence. He was placed on Flomax for 7 days, with some improvement in his symptoms. He also complains of bilateral scrotal and rectal pain, which is spasmodic. PVR was 672 mL. He attempted to void further and follow-up residual was not significantly changed.    PMH: Past Medical History:  Diagnosis Date   Allergy    COVID-19    DDD (degenerative disc disease), lumbar    GERD (gastroesophageal reflux disease)    Hyperlipidemia    Hypertension    IBS (irritable bowel syndrome)    Lumbar spondylosis    Sleep apnea    mouth gaurd   Traumatic pneumothorax 12/04/2015   Vitamin D deficiency     Surgical History: Past Surgical History:  Procedure Laterality Date   CHEST TUBE INSERTION Right 05/15/2002   thoracostomy   COLONOSCOPY     ESOPHAGOGASTRODUODENOSCOPY (EGD) WITH PROPOFOL N/A 12/15/2020   Procedure: ESOPHAGOGASTRODUODENOSCOPY (EGD) WITH PROPOFOL;  Surgeon: Earline Mayotte, MD;  Location: [facility] ENDOSCOPY;  Service: Endoscopy;  Laterality: N/A;  with possible dilation   LUMBAR LAMINECTOMY/DECOMPRESSION MICRODISCECTOMY N/A 07/28/2023   Procedure: L3-5 POSTERIOR SPINAL DECOMPRESSION;  Surgeon:  Venetia Night, MD;  Location: [facility] ORS;  Service: Neurosurgery;  Laterality: N/A;   TOE SURGERY  08/05/2006   right big toe    Home Medications:  Allergies as of 08/25/2023       Reactions   Lipitor [atorvastatin]    Myalgias   Prozac [fluoxetine Hcl] Other (See Comments)   Did not like the way it made him feel/think   Rosuvastatin    myalgias   Simvastatin    Myalgias        Medication List        Accurate as of August 25, 2023  3:21 PM. If you have any questions, ask your nurse or doctor.          acetaminophen 500 MG tablet Commonly known as: TYLENOL Take 500-1,000 mg by mouth every 6 (six) hours as needed (pain).   celecoxib 100 MG capsule Commonly known as: CELEBREX Take 1 tab q 12 hours as needed   COENZYME Q10 PO Take 1 capsule by mouth in the morning.   famotidine 20 MG tablet Commonly known as: PEPCID Take  1 tablet  2 x /day to Prevent Heart burn & Indigestion                                                           /  TAKE                                            BY                                              MOUTH What changed:  how much to take how to take this when to take this reasons to take this additional instructions   FISH OIL PO Take 1 capsule by mouth in the morning.   Gemtesa 75 MG Tabs Generic drug: Vibegron Take 1 tablet (75 mg total) by mouth daily. Started by: Riki Altes   ibuprofen 200 MG tablet Commonly known as: ADVIL Take 200-400 mg by mouth every 8 (eight) hours as needed (pain.).   lidocaine-prilocaine cream Commonly known as: EMLA APPLY TOPICALLY AS NEEDED   lisinopril-hydrochlorothiazide 20-25 MG tablet Commonly known as: ZESTORETIC TAKE 1/2 (ONE-HALF) TABLET BY MOUTH ONCE DAILY FOR BLOOD PRESSURE   methocarbamol 750 MG tablet Commonly known as: Robaxin-750 Take 1 tablet (750 mg total) by mouth 3 (three) times daily as needed for muscle spasms.    montelukast 10 MG tablet Commonly known as: SINGULAIR TAKE 1 TABLET BY MOUTH ONCE DAILY FOR ALLERGIES AND FOR ASTHMA What changed:  how much to take how to take this when to take this   MULTIVITAMIN PO Take 1 tablet by mouth in the morning.   nitroGLYCERIN 2 % ointment Commonly known as: NITROGLYN Apply   1/2 "  to Hemorrhoid  every 6 hours  as needed   oxybutynin 5 MG 24 hr tablet Commonly known as: DITROPAN-XL Take 1 tablet (5 mg total) by mouth at bedtime.   oxyCODONE 5 MG immediate release tablet Commonly known as: Roxicodone Take 1 tablet (5 mg total) by mouth every 4 (four) hours as needed for up to 15 days for moderate pain (pain score 4-6).   Procto-Med HC 2.5 % rectal cream Generic drug: hydrocortisone APPLY CREAM  RECTALLY 4 TIMES DAILY What changed: See the new instructions.   rosuvastatin 20 MG tablet Commonly known as: CRESTOR TAKE 1 TABLET BY MOUTH ONCE DAILY FOR CHOLESTEROL What changed:  how much to take how to take this when to take this additional instructions   tamsulosin 0.4 MG Caps capsule Commonly known as: FLOMAX Take 1 capsule (0.4 mg total) by mouth daily. Started by: Riki Altes   VITAMIN D PO Take 1 tablet by mouth in the morning.        Allergies:  Allergies  Allergen Reactions   Lipitor [Atorvastatin]     Myalgias   Prozac [Fluoxetine Hcl] Other (See Comments)    Did not like the way it made him feel/think   Rosuvastatin     myalgias   Simvastatin     Myalgias    Family History: Family History  Problem Relation Age of Onset   Cancer Mother        lung   Hyperlipidemia Mother    Hypertension Father    Hyperlipidemia Father    Dementia Brother    Hyperlipidemia Brother    Hyperlipidemia Brother    Hyperlipidemia Brother    Colon cancer Maternal Grandmother    Diabetes Maternal Grandfather     Social History:  reports  that he quit smoking about 22 years ago. His smoking use included cigarettes. He has never  used smokeless tobacco. He reports current alcohol use of about 10.0 standard drinks of alcohol per week. He reports that he does not use drugs.   Physical Exam: BP 110/79   Pulse 99   Ht 5\' 11"  (1.803 m)   Wt 225 lb (102.1 kg)   BMI 31.38 kg/m   Constitutional:  Alert and oriented, No acute distress. HEENT: [place] AT Respiratory: Normal respiratory effort, no increased work of breathing. GU: Phallus without lesions. Testes are descended bilaterally, without masses or tenderness. No perineal tenderness. Anal sphincter tone is normal, mild pelvic floor and mild prostatic tenderness, though not significant. Skin: No rashes, bruises or suspicious lesions. Neurologic: Grossly intact, no focal deficits, moving all 4 extremities. Psychiatric: Normal mood and affect.   Urinalysis Dipstick/microscopy negative.   Assessment & Plan:    1. Urinary retention Recommend Foley catheter placement x1 week for bladder rest with cath removal/repeat voiding trial 1 week.  Restart tamsulosin 0.4 mg daily.  2. Pelvic pain Scrotal exam normal and pain most likely referred Given Gemtesa sample 75 mg daily.   I have reviewed the above documentation for accuracy and completeness, and I agree with the above.   Riki Altes, MD  [facility] [address] [place], [place] [address] [phone]

## 2023-08-25 NOTE — Progress Notes (Unsigned)
Simple Catheter Placement  Due to urinary retention patient is present today for a foley cath placement.  Patient was cleaned and prepped in a sterile fashion with betadine and 2% lidocaine jelly was instilled into the urethra. A 16 codue FR foley catheter was inserted, urine return was noted  , urine was yellow  in color.  The balloon was filled with 10cc of sterile water.  A leg  bag was attached for drainage. Patient was also given a night bag to take home and was given instruction on how to change from one bag to another.  Patient was given instruction on proper catheter care.  Patient tolerated well, no complications were noted   Performed by: Ples Specter  Additional notes/ Follow up: one week .

## 2023-08-25 NOTE — Telephone Encounter (Signed)
Pt's fiance (per dpr) stating that pt is walking the floor in pain from the cath, per verbal from Dr. Lonna Cobb ok to send in oxybutynin 5mg .  rx sent to pharmacy by e-script

## 2023-08-26 ENCOUNTER — Ambulatory Visit (INDEPENDENT_AMBULATORY_CARE_PROVIDER_SITE_OTHER): Payer: 59 | Admitting: Neurosurgery

## 2023-08-26 ENCOUNTER — Encounter: Payer: Self-pay | Admitting: Neurosurgery

## 2023-08-26 VITALS — BP 108/70 | Ht 71.0 in | Wt 225.0 lb

## 2023-08-26 DIAGNOSIS — Z09 Encounter for follow-up examination after completed treatment for conditions other than malignant neoplasm: Secondary | ICD-10-CM

## 2023-08-26 DIAGNOSIS — M48062 Spinal stenosis, lumbar region with neurogenic claudication: Secondary | ICD-10-CM

## 2023-08-26 DIAGNOSIS — R339 Retention of urine, unspecified: Secondary | ICD-10-CM

## 2023-08-26 NOTE — Telephone Encounter (Signed)
Mr Seeliger has been added to Dr Lucienne Capers clinic today.

## 2023-08-26 NOTE — Progress Notes (Signed)
   REFERRING PHYSICIAN:  Lucky Cowboy, Md [address] [place],  [place] [address]  DOS: 07/28/23, L3-5 lumbar decompression and CSF leak repair  HISTORY OF PRESENT ILLNESS: SEMISI HARDEY is status post L3-5 decompression and CSF leak repair. he is doing ok.  He had a Foley catheter placed yesterday.  He has likely been in a chronic state of urinary retention.  He is feeling better now that he has had his catheter.  The numbness in his legs has changed somewhat.  He feels that it has improved.      PHYSICAL EXAMINATION:  General: Patient is well developed, well nourished, calm, collected, and in no apparent distress.   NEUROLOGICAL:  General: In no acute distress.   Awake, alert, oriented to person, place, and time.  Pupils equal round and reactive to light.  Facial tone is symmetric.    Strength:            Side Iliopsoas Quads Hamstring PF DF EHL  R 5 5 5 5 5 5   L 5 5 5 5 5 5    Incision c/d/I.  It is healing by secondary intention.   ROS (Neurologic):  Negative except as noted above  IMAGING: No new imaging today.  ASSESSMENT/PLAN:  MARLAN LENNEY is status post L3-5 decompression and CSF leak repair.  He is improving.  He has had some urinary retention.  He is currently on a week of bladder rest with an indwelling Foley.  He will see Dr. Lonna Cobb next week for this.  I am sure the anesthetics and pain medications he required for surgery contributed to his urinary retention.  I am encouraged that his legs are feeling better.  I think he will continue to improve.  I will see him back in 2 weeks.     Advised to contact the office if any questions or concerns arise.  Venetia Night MD Department of neurosurgery

## 2023-08-29 ENCOUNTER — Encounter: Payer: Self-pay | Admitting: Urology

## 2023-09-01 ENCOUNTER — Ambulatory Visit: Payer: 59 | Admitting: Physician Assistant

## 2023-09-01 VITALS — BP 104/69 | HR 88 | Ht 71.0 in | Wt 226.3 lb

## 2023-09-01 DIAGNOSIS — R339 Retention of urine, unspecified: Secondary | ICD-10-CM

## 2023-09-01 DIAGNOSIS — K602 Anal fissure, unspecified: Secondary | ICD-10-CM | POA: Insufficient documentation

## 2023-09-01 LAB — BLADDER SCAN AMB NON-IMAGING: Scan Result: 265

## 2023-09-01 NOTE — Patient Instructions (Signed)
CONTINUE Flomax (tamsulosin). STOP Gemtesa (vibegron) and Ditropan (oxybutynin).

## 2023-09-01 NOTE — Progress Notes (Signed)
09/01/2023 4:52 PM   Matthew Salinas 1965/09/16 [id number]  CC: Chief Complaint  Patient presents with   voiding trial   HPI: Matthew Salinas is a 58 y.o. male with a recent history of urinary retention after undergoing L3-L5 lumbar decompression with Dr. Myer Haff last month who presents today for voiding trial.  His wife took his catheter out at home this morning about 8 hours ago.  He has been able to void multiple times.  He admits that he is not straining to fully empty in light of his recent surgery and he could probably void more if he tried harder.  He denies pain today.  PVR 265 mL.  PMH: Past Medical History:  Diagnosis Date   Allergy    COVID-19    DDD (degenerative disc disease), lumbar    GERD (gastroesophageal reflux disease)    Hyperlipidemia    Hypertension    IBS (irritable bowel syndrome)    Lumbar spondylosis    Sleep apnea    mouth gaurd   Traumatic pneumothorax 12/04/2015   Vitamin D deficiency     Surgical History: Past Surgical History:  Procedure Laterality Date   CHEST TUBE INSERTION Right 05/15/2002   thoracostomy   COLONOSCOPY     ESOPHAGOGASTRODUODENOSCOPY (EGD) WITH PROPOFOL N/A 12/15/2020   Procedure: ESOPHAGOGASTRODUODENOSCOPY (EGD) WITH PROPOFOL;  Surgeon: Earline Mayotte, MD;  Location: [facility] END[facility];  Service: Endoscopy;  Laterality: N/A;  with possible dilation   LUMBAR LAMINECTOMY/DECOMPRESSION MICRODISCECTOMY N/A 07/28/2023   Procedure: L3-5 POSTERIOR SPINAL DECOMPRESSION;  Surgeon: Venetia Night, MD;  Location: [facility] ORS;  Service: Neurosurgery;  Laterality: N/A;   TOE SURGERY  08/05/2006   right big toe    Home Medications:  Allergies as of 09/01/2023       Reactions   Lipitor [atorvastatin]    Myalgias   Prozac [fluoxetine Hcl] Other (See Comments)   Did not like the way it made him feel/think   Rosuvastatin    myalgias   Simvastatin    Myalgias        Medication List        Accurate as of September 01, 2023  4:52 PM. If you have any questions, ask your nurse or doctor.          STOP taking these medications    celecoxib 100 MG capsule Commonly known as: CELEBREX   FISH OIL PO   Gemtesa 75 MG Tabs Generic drug: Vibegron   lisinopril-hydrochlorothiazide 20-25 MG tablet Commonly known as: ZESTORETIC   methocarbamol 750 MG tablet Commonly known as: Robaxin-750   montelukast 10 MG tablet Commonly known as: SINGULAIR   MULTIVITAMIN PO   oxybutynin 5 MG 24 hr tablet Commonly known as: DITROPAN-XL   rosuvastatin 20 MG tablet Commonly known as: CRESTOR   VITAMIN D PO       TAKE these medications    acetaminophen 500 MG tablet Commonly known as: TYLENOL Take 500-1,000 mg by mouth every 6 (six) hours as needed (pain).   COENZYME Q10 PO Take 1 capsule by mouth in the morning.   famotidine 20 MG tablet Commonly known as: PEPCID Take  1 tablet  2 x /day to Prevent Heart burn & Indigestion                                                           /  TAKE                                            BY                                              MOUTH What changed:  how much to take how to take this when to take this reasons to take this additional instructions   ibuprofen 200 MG tablet Commonly known as: ADVIL Take 200-400 mg by mouth every 8 (eight) hours as needed (pain.).   lidocaine-prilocaine cream Commonly known as: EMLA APPLY TOPICALLY AS NEEDED   nitroGLYCERIN 2 % ointment Commonly known as: NITROGLYN Apply   1/2 "  to Hemorrhoid  every 6 hours  as needed   Procto-Med HC 2.5 % rectal cream Generic drug: hydrocortisone APPLY CREAM  RECTALLY 4 TIMES DAILY What changed: See the new instructions.   tamsulosin 0.4 MG Caps capsule Commonly known as: FLOMAX Take 1 capsule (0.4 mg total) by mouth daily.        Allergies:  Allergies  Allergen Reactions   Lipitor [Atorvastatin]     Myalgias   Prozac  [Fluoxetine Hcl] Other (See Comments)    Did not like the way it made him feel/think   Rosuvastatin     myalgias   Simvastatin     Myalgias    Family History: Family History  Problem Relation Age of Onset   Cancer Mother        lung   Hyperlipidemia Mother    Hypertension Father    Hyperlipidemia Father    Dementia Brother    Hyperlipidemia Brother    Hyperlipidemia Brother    Hyperlipidemia Brother    Colon cancer Maternal Grandmother    Diabetes Maternal Grandfather     Social History:   reports that he quit smoking about 22 years ago. His smoking use included cigarettes. He has never used smokeless tobacco. He reports current alcohol use of about 10.0 standard drinks of alcohol per week. He reports that he does not use drugs.  Physical Exam: BP 104/69   Pulse 88   Ht 5\' 11"  (1.803 m)   Wt 226 lb 5 oz (102.7 kg)   BMI 31.56 kg/m   Constitutional:  Alert and oriented, no acute distress, nontoxic appearing HEENT: [place], AT Cardiovascular: No clubbing, cyanosis, or edema Respiratory: Normal respiratory effort, no increased work of breathing Skin: No rashes, bruises or suspicious lesions Neurologic: Grossly intact, no focal deficits, moving all 4 extremities Psychiatric: Normal mood and affect  Laboratory Data: Results for orders placed or performed in visit on 09/01/23  Bladder Scan (Post Void Residual) in office   Collection Time: 09/01/23  4:12 PM  Result Value Ref Range   Scan Result 265 ml    Assessment & Plan:   1. Urinary retention (Primary) Voiding trial passed, I suspect he would be able to empty further if he attempted this.  We discussed stopping oxybutynin and Gemtesa to maximize emptying, but I would like him to stay on Flomax for now.  Will plan to bring him back tomorrow for repeat PVR to ensure stability.  We discussed returning to clinic sooner if he has pain or difficulty voiding.  He is in agreement  with this plan. - Bladder Scan (Post Void Residual)  in office  Return in about 1 day (around 09/02/2023) for Repeat PVR.  Carman Ching, PA-C  [facility] [place] [address] [place], [place] [address] [phone]

## 2023-09-02 ENCOUNTER — Ambulatory Visit (INDEPENDENT_AMBULATORY_CARE_PROVIDER_SITE_OTHER): Payer: 59 | Admitting: Physician Assistant

## 2023-09-02 ENCOUNTER — Encounter: Payer: Self-pay | Admitting: Urology

## 2023-09-02 DIAGNOSIS — K5900 Constipation, unspecified: Secondary | ICD-10-CM | POA: Diagnosis not present

## 2023-09-02 DIAGNOSIS — M6289 Other specified disorders of muscle: Secondary | ICD-10-CM

## 2023-09-02 DIAGNOSIS — R339 Retention of urine, unspecified: Secondary | ICD-10-CM

## 2023-09-02 LAB — BLADDER SCAN AMB NON-IMAGING: Scan Result: 308

## 2023-09-02 MED ORDER — TAMSULOSIN HCL 0.4 MG PO CAPS: 0.8000 mg | ORAL_CAPSULE | Freq: Every day | ORAL | 11 refills | Status: DC

## 2023-09-02 NOTE — Progress Notes (Signed)
09/02/2023 12:29 PM   Matthew Salinas April 16, 1966 [id number]  Referring provider: Lucky Cowboy, MD [address] [place],  [place] [address]  Urological history: 1. BPH with LU TS -PSA (06/2023) 1.00 -Tamsulosin 0.4 mg daily  2. Urinary retention -Occurred after L3-5 lumbar decompression on July 28, 2023 -Foley placed for 1 week on August 25, 2023 for a PVR of 625 -He passed voiding trial yesterday   Chief Complaint  Patient presents with   Urinary Retention    HPI: Matthew Salinas is a 58 y.o. male who presented to the front desk today requesting an urgent appointment for bladder issues with his SO, Matthew Salinas.    Previous records reviewed.   He states after his Foley came out yesterday he had a good evening until he strained to have a bowel movement.  He states that worsened his hemorrhoids and he had the return of the scrotal pain.  He is urinating frequently now, but he denies any suprapubic discomfort.  Patient denies any modifying or aggravating factors.  Patient denies any recent UTI's, gross hematuria, dysuria or suprapubic/flank pain.  Patient denies any fevers, chills, nausea or vomiting.    Of note, he and Matthew Salinas expressed the concern that he has always had issues with voiding even before the back surgery.  He often have to stand there and leaned up against the wall to "relax" before he could void.  He even mentions that when he would go fishing with the guys, the guys would have no problem pain over the side of the boat.  He, on the other hand, would have to shift his pelvis back-and-forth through relax in order to relieve his bladder.  PVR 308 mL   PMH: Past Medical History:  Diagnosis Date   Allergy    COVID-19    DDD (degenerative disc disease), lumbar    GERD (gastroesophageal reflux disease)    Hyperlipidemia    Hypertension    IBS (irritable bowel syndrome)    Lumbar spondylosis    Sleep apnea    mouth gaurd   Traumatic  pneumothorax 12/04/2015   Vitamin D deficiency     Surgical History: Past Surgical History:  Procedure Laterality Date   CHEST TUBE INSERTION Right 05/15/2002   thoracostomy   COLONOSCOPY     ESOPHAGOGASTRODUODENOSCOPY (EGD) WITH PROPOFOL N/A 12/15/2020   Procedure: ESOPHAGOGASTRODUODENOSCOPY (EGD) WITH PROPOFOL;  Surgeon: Earline Mayotte, MD;  Location: [facility] ENDOS[facility];  Service: Endoscopy;  Laterality: N/A;  with possible dilation   LUMBAR LAMINECTOMY/DECOMPRESSION MICRODISCECTOMY N/A 07/28/2023   Procedure: L3-5 POSTERIOR SPINAL DECOMPRESSION;  Surgeon: Venetia Night, MD;  Location: [facility] ORS;  Service: Neurosurgery;  Laterality: N/A;   TOE SURGERY  08/05/2006   right big toe    Home Medications:  Allergies as of 09/02/2023       Reactions   Lipitor [atorvastatin]    Myalgias   Prozac [fluoxetine Hcl] Other (See Comments)   Did not like the way it made him feel/think   Rosuvastatin    myalgias   Simvastatin    Myalgias        Medication List        Accurate as of September 02, 2023 12:29 PM. If you have any questions, ask your nurse or doctor.          acetaminophen 500 MG tablet Commonly known as: TYLENOL Take 500-1,000 mg by mouth every 6 (six) hours as needed (pain).   COENZYME Q10 PO Take 1 capsule  by mouth in the morning.   famotidine 20 MG tablet Commonly known as: PEPCID Take  1 tablet  2 x /day to Prevent Heart burn & Indigestion                                                           /                                       TAKE                                            BY                                              MOUTH What changed:  how much to take how to take this when to take this reasons to take this additional instructions   ibuprofen 200 MG tablet Commonly known as: ADVIL Take 200-400 mg by mouth every 8 (eight) hours as needed (pain.).   lidocaine-prilocaine cream Commonly known as: EMLA APPLY TOPICALLY AS NEEDED    nitroGLYCERIN 2 % ointment Commonly known as: NITROGLYN Apply   1/2 "  to Hemorrhoid  every 6 hours  as needed   Procto-Med HC 2.5 % rectal cream Generic drug: hydrocortisone APPLY CREAM  RECTALLY 4 TIMES DAILY What changed: See the new instructions.   tamsulosin 0.4 MG Caps capsule Commonly known as: FLOMAX Take 2 capsules (0.8 mg total) by mouth daily. What changed: how much to take        Allergies:  Allergies  Allergen Reactions   Lipitor [Atorvastatin]     Myalgias   Prozac [Fluoxetine Hcl] Other (See Comments)    Did not like the way it made him feel/think   Rosuvastatin     myalgias   Simvastatin     Myalgias    Family History: Family History  Problem Relation Age of Onset   Cancer Mother        lung   Hyperlipidemia Mother    Hypertension Father    Hyperlipidemia Father    Dementia Brother    Hyperlipidemia Brother    Hyperlipidemia Brother    Hyperlipidemia Brother    Colon cancer Maternal Grandmother    Diabetes Maternal Grandfather     Social History:  reports that he quit smoking about 22 years ago. His smoking use included cigarettes. He has never used smokeless tobacco. He reports current alcohol use of about 10.0 standard drinks of alcohol per week. He reports that he does not use drugs.  ROS: Pertinent ROS in HPI  Physical Exam: Constitutional:  Well nourished. Alert and oriented, No acute distress. HEENT: [place] AT, moist mucus membranes.  Trachea midline, no masses. Cardiovascular: No clubbing, cyanosis, or edema. Respiratory: Normal respiratory effort, no increased work of breathing. GU: No CVA tenderness.  No bladder fullness or masses.  Patient with circumcised phallus.  Urethral meatus is patent.  No penile discharge. No penile lesions or  rashes. Scrotum without lesions, cysts, rashes and/or edema.  Testicles are located scrotally bilaterally. No masses are appreciated in the testicles. Left and right epididymis are normal. Rectal:  Deferred secondary to hemorrhoids  Neurologic: Grossly intact, no focal deficits, moving all 4 extremities. Psychiatric: Normal mood and affect.  Laboratory Data: Lab Results  Component Value Date   WBC 5.6 06/20/2023   HGB 14.8 06/20/2023   HCT 44.0 06/20/2023   MCV 95.7 06/20/2023   PLT 278 06/20/2023    Lab Results  Component Value Date   CREATININE 0.77 07/29/2023    Lab Results  Component Value Date   PSA 1.00 06/20/2023   PSA 0.90 05/01/2022   PSA 0.62 12/25/2021    Lab Results  Component Value Date   TESTOSTERONE 520 06/20/2023    Lab Results  Component Value Date   HGBA1C 5.6 06/20/2023    Lab Results  Component Value Date   TSH 1.28 06/20/2023       Component Value Date/Time   CHOL 243 (H) 06/20/2023 0945   HDL 67 06/20/2023 0945   CHOLHDL 3.6 06/20/2023 0945   VLDL 68 (H) 10/09/2016 1030   LDLCALC 122 (H) 06/20/2023 0945    Lab Results  Component Value Date   AST 25 06/20/2023   Lab Results  Component Value Date   ALT 23 06/20/2023  I have reviewed the labs.   Pertinent Imaging:  09/02/23 08:07  Scan Result 308   Assessment & Plan:    1. Urinary retention (Primary) - Bladder Scan (Post Void Residual) in office - PVR demonstrates adequate bladder emptying - We will increase the tamsulosin to 0.4 mg twice daily  2. Constipation -Encouraged him to go back on his bowel regimen recommended by his back surgeon -Advised to not continue to take the Gemtesa or oxybutynin  3.  Pelvic floor dysfunction -If patient continues to have lower urinary tract symptoms and/or issues with retention, in addition to cystoscopy, we may need to consider pelvic floor physical therapy since he seems to have a longstanding history of issues with voiding  Return if symptoms worsen or fail to improve.  These notes generated with voice recognition software. I apologize for typographical errors.  Cloretta Ned  [facility] [address]  [address] [place], [place] [address] [phone]

## 2023-09-08 ENCOUNTER — Ambulatory Visit (INDEPENDENT_AMBULATORY_CARE_PROVIDER_SITE_OTHER): Payer: 59 | Admitting: Nurse Practitioner

## 2023-09-08 ENCOUNTER — Encounter: Payer: Self-pay | Admitting: Nurse Practitioner

## 2023-09-08 VITALS — BP 114/78 | HR 98 | Temp 98.1°F | Ht 71.0 in | Wt 227.0 lb

## 2023-09-08 DIAGNOSIS — K648 Other hemorrhoids: Secondary | ICD-10-CM

## 2023-09-08 DIAGNOSIS — N410 Acute prostatitis: Secondary | ICD-10-CM

## 2023-09-08 DIAGNOSIS — N5082 Scrotal pain: Secondary | ICD-10-CM

## 2023-09-08 DIAGNOSIS — K5903 Drug induced constipation: Secondary | ICD-10-CM

## 2023-09-08 MED ORDER — CIPROFLOXACIN HCL 500 MG PO TABS: 500.0000 mg | ORAL_TABLET | Freq: Two times a day (BID) | ORAL | 0 refills | Status: DC

## 2023-09-08 MED ORDER — HYDROCORTISONE ACETATE 25 MG RE SUPP: RECTAL | 3 refills | Status: AC

## 2023-09-08 MED ORDER — PREDNISONE 10 MG PO TABS: ORAL_TABLET | ORAL | 0 refills | Status: DC

## 2023-09-08 NOTE — Patient Instructions (Signed)

## 2023-09-08 NOTE — Progress Notes (Signed)
Assessment and Plan:  Matthew Salinas was seen today for an episodic visit.  Diagnoses and all order for this visit:  1. Acute prostatitis (Primary) Start tmt with abx - take in full to decrease risk for abx resistance. Start steroid taper to help decrease inflammation of prostate. Continue Tamsulosin 0.8 mg daily. Stay well hydrated to keep urinary system well flushed. Discussed further imaging/diagnostics if s/s fail to improve given s/p 5 weeks L3-5 Spinal Compression.   - ciprofloxacin (CIPRO) 500 MG tablet; Take 1 tablet (500 mg total) by mouth 2 (two) times daily.  Dispense: 14 tablet; Refill: 0 - predniSONE (DELTASONE) 10 MG tablet; 1 tab 3 x day for 2 days, then 1 tab 2 x day for 2 days, then 1 tab 1 x day for 3 days  Dispense: 13 tablet; Refill: 0  2. Scrotal pain Start steroid taper as directed.  - predniSONE (DELTASONE) 10 MG tablet; 1 tab 3 x day for 2 days, then 1 tab 2 x day for 2 days, then 1 tab 1 x day for 3 days  Dispense: 13 tablet; Refill: 0  3. Drug-induced constipation Discussed importance of staying well hydrated (at least 64 oz daily) and remaining active. Continue stool softener/laxative as directed PRN. Continue to avoid narcotics, anticholingeric, muscle relaxers medications  4. Other hemorrhoids Start Hydrocortisone suppository to decrease hemorrhoid inflammation. Discussed importance of staying well hydrated (at least 64 oz daily) and remaining active. Continue stool softener/laxative as directed PRN. Avoid straining  - hydrocortisone (ANUSOL-HC) 25 MG suppository; Insert 1 rectal suppository 2 x /day  Dispense: 12 suppository; Refill: 3  Notify office for further evaluation and treatment, questions or concerns if s/s fail to improve. The risks and benefits of my recommendations, as well as other treatment options were discussed with the patient today. Questions were answered.  Further disposition pending results of labs. Discussed med's effects and  SE's.    Over 30 minutes of exam, counseling, chart review, and critical decision making was performed.   Future Appointments  Date Time Provider Department Center  09/08/2023  9:45 AM Adela Glimpse, NP [facility] None  09/09/2023  9:45 AM Venetia Night, MD [facility] None  10/01/2023 10:45 AM Raynelle Dick, NP [facility] None  10/16/2023 10:00 AM Susanne Borders, PA [facility] None  01/01/2024 10:30 AM Lucky Cowboy, MD [facility] None  04/02/2024 10:30 AM Raynelle Dick, NP [facility] None  07/07/2024 10:00 AM Lucky Cowboy, MD [facility] None    ------------------------------------------------------------------------------------------------------------------   HPI BP 114/78   Pulse 98   Temp 98.1 F (36.7 C)   Ht 5\' 11"  (1.803 m)   Wt 227 lb (103 kg)   SpO2 99%   BMI 31.66 kg/m   57 y.o.male presents for evaluation of pain in scrotal area and at the base of penis accompanied by constipation.    He is s/p L3-5 posterior spinal decompression with Dr. Gwenyth Bender 07/27/24.  He underwent post-op complications of urinary retention with insertion of x2 foley catheters, last most recently removed x1 week ago, by wife, who is an [employer] fundraiser.    He has since followed with Urology, [facility], 09/01/23 and 09/02/23 and been prescribed Flomax which he feels has not aided in decreasing symptoms.  States he did have DRE and was told he had some prostate inflammation but was not treated with abx. He is continuing to make urine.  Denies hematuria, continues to have dribbling.   He continues to have constipation.  Stopped all pain  medications and muscle relaxers including oxybutynin x 1 week.  Normal BM pattern p/t surgery was once a day.  He continues to go 3-4 times weekly.  He is taking stool softener and laxative and feels as though he has had success with starting to move bowels more often.  He is not staying well hydrated.  He is taking in minimal food and fluids for fear of  increase in urination (painful) and increase in constipation.    He denies fever, chills, N/V.    Past Medical History:  Diagnosis Date   Allergy    COVID-19    DDD (degenerative disc disease), lumbar    GERD (gastroesophageal reflux disease)    Hyperlipidemia    Hypertension    IBS (irritable bowel syndrome)    Lumbar spondylosis    Sleep apnea    mouth gaurd   Traumatic pneumothorax 12/04/2015   Vitamin D deficiency      Allergies  Allergen Reactions   Lipitor [Atorvastatin]     Myalgias   Prozac [Fluoxetine Hcl] Other (See Comments)    Did not like the way it made him feel/think   Rosuvastatin     myalgias   Simvastatin     Myalgias    Current Outpatient Medications on File Prior to Visit  Medication Sig   acetaminophen (TYLENOL) 500 MG tablet Take 500-1,000 mg by mouth every 6 (six) hours as needed (pain).   famotidine (PEPCID) 20 MG tablet Take  1 tablet  2 x /day to Prevent Heart burn & Indigestion                                                           /                                       TAKE                                            BY                                              MOUTH (Patient taking differently: Take 20 mg by mouth 2 (two) times daily as needed for indigestion or heartburn.)   ibuprofen (ADVIL) 200 MG tablet Take 200-400 mg by mouth every 8 (eight) hours as needed (pain.).   lidocaine-prilocaine (EMLA) cream APPLY TOPICALLY AS NEEDED   PROCTO-MED HC 2.5 % rectal cream APPLY CREAM  RECTALLY 4 TIMES DAILY (Patient taking differently: Place 1 Application rectally 4 (four) times daily as needed for hemorrhoids.)   tamsulosin (FLOMAX) 0.4 MG CAPS capsule Take 2 capsules (0.8 mg total) by mouth daily.   COENZYME Q10 PO Take 1 capsule by mouth in the morning. (Patient not taking: Reported on 09/08/2023)   nitroGLYCERIN (NITROGLYN) 2 % ointment Apply   1/2 "  to Hemorrhoid  every 6 hours  as needed (Patient not taking: Reported on 09/08/2023)   No  current facility-administered medications  on file prior to visit.    ROS: all negative except what is noted in the HPI.   Physical Exam:  BP 114/78   Pulse 98   Temp 98.1 F (36.7 C)   Ht 5\' 11"  (1.803 m)   Wt 227 lb (103 kg)   SpO2 99%   BMI 31.66 kg/m   General Appearance: NAD.  Awake, conversant and cooperative. Eyes: PERRLA, EOMs intact.  Sclera white.  Conjunctiva without erythema. Sinuses: No frontal/maxillary tenderness.  No nasal discharge. Nares patent.  ENT/Mouth: Ext aud canals clear.  Bilateral TMs w/DOL and without erythema or bulging. Hearing intact.  Posterior pharynx without swelling or exudate.  Tonsils without swelling or erythema.  Neck: Supple.  No masses, nodules or thyromegaly. Respiratory: Effort is regular with non-labored breathing. Breath sounds are equal bilaterally without rales, rhonchi, wheezing or stridor.  Cardio: RRR with no MRGs. Brisk peripheral pulses without edema.  Abdomen: Active BS in all four quadrants.  Soft and non-tender without guarding, rebound tenderness, hernias or masses. Lymphatics: Non tender without lymphadenopathy.  Musculoskeletal: Full ROM, 5/5 strength, normal ambulation.  No clubbing or cyanosis. Skin: Appropriate color for ethnicity. Warm without rashes, lesions, ecchymosis, ulcers.  Neuro: CN II-XII grossly normal. Normal muscle tone without cerebellar symptoms and intact sensation.   Psych: AO X 3,  appropriate mood and affect, insight and judgment.     Adela Glimpse, NP 9:40 AM [facility]

## 2023-09-09 ENCOUNTER — Ambulatory Visit (INDEPENDENT_AMBULATORY_CARE_PROVIDER_SITE_OTHER): Payer: 59 | Admitting: Neurosurgery

## 2023-09-09 VITALS — BP 130/82 | Ht 71.0 in | Wt 227.0 lb

## 2023-09-09 DIAGNOSIS — M48062 Spinal stenosis, lumbar region with neurogenic claudication: Secondary | ICD-10-CM

## 2023-09-09 NOTE — Progress Notes (Signed)
   REFERRING PHYSICIAN:  Tonita Fallow, Md [address] [place],  [place] [address]  DOS: 07/28/23, L3-5 lumbar decompression and CSF leak repair  HISTORY OF PRESENT ILLNESS: Matthew Salinas is status post L3-5 decompression and CSF leak repair. he is doing better.  His numbness has improved.  He still has some numbness, but it has improved.  He is doing much better than when I last saw him.  He has had the Foley catheter removed.       PHYSICAL EXAMINATION:  General: Patient is well developed, well nourished, calm, collected, and in no apparent distress.   NEUROLOGICAL:  General: In no acute distress.   Awake, alert, oriented to person, place, and time.  Pupils equal round and reactive to light.  Facial tone is symmetric.    Strength:            Side Iliopsoas Quads Hamstring PF DF EHL  R 5 5 5 5 5 5   L 5 5 5 5 5 5    Incision c/d/I.  It is healing by secondary intention.   ROS (Neurologic):  Negative except as noted above  IMAGING: No new imaging today.  ASSESSMENT/PLAN:  Matthew Salinas is status post L3-5 decompression and CSF leak repair.  He is improving.  I am pleased with his improvements.  We reviewed his activity limitations.  He is not ready to return to work.  We will see him back in clinic in approximately 6 weeks.    I would like to see him in delayed follow-up approximately 9 months after surgery.  Advised to contact the office if any questions or concerns arise.  Matthew Daisy MD Department of neurosurgery

## 2023-09-11 ENCOUNTER — Telehealth: Payer: Self-pay

## 2023-09-11 NOTE — Telephone Encounter (Signed)
 Mr Jutras sent a Clinical cytogeneticist message requesting a work note to remain out of work until his next appointment with Dr Mont Antis. Work note has been written to stay out of work until the Monday after his appointment with Dr Mont Antis (10/20/23).

## 2023-09-11 NOTE — Telephone Encounter (Signed)
 I don't see where I filled out any FMLA for him. There is nothing scanned into [employer]. Do you want to put in a doctor's note and I can fax it to his disability.

## 2023-09-12 NOTE — Telephone Encounter (Signed)
 Patient dropped off FMLA form. It has been filled out and faxed back.

## 2023-09-16 ENCOUNTER — Encounter: Payer: Self-pay | Admitting: Internal Medicine

## 2023-10-01 ENCOUNTER — Ambulatory Visit: Payer: 59 | Admitting: Nurse Practitioner

## 2023-10-08 NOTE — Progress Notes (Signed)
 10/10/2023 10:42 PM   Matthew Salinas Dec 05, 1965 [id number]  Referring provider: Lucky Cowboy, MD [address] [place],  [place] [address]  Urological history: 1. BPH with LU TS -PSA (06/2023) 1.00 -Tamsulosin 0.4 mg daily  2. Urinary retention -Occurred after L3-5 lumbar decompression on July 28, 2023 -Foley placed for 1 week on August 25, 2023 for a PVR of 625  Chief Complaint  Patient presents with   Follow-up   HPI: Matthew Salinas is a 58 y.o. male who presents today to discuss prostate.  Previous records reviewed.   At his visit on 09/02/2023, he states after his Foley came out yesterday he had a good evening until he strained to have a bowel movement.  He states that worsened his hemorrhoids and he had the return of the scrotal pain.  He is urinating frequently now, but he denies any suprapubic discomfort.  Patient denies any modifying or aggravating factors.  Patient denies any recent UTI's, gross hematuria, dysuria or suprapubic/flank pain.  Patient denies any fevers, chills, nausea or vomiting.  Of note, he and [name] expressed the concern that he has always had issues with voiding even before the back surgery.  He often have to stand there and leaned up against the wall to "relax" before he could void.  He even mentions that when he would go fishing with the guys, the guys would have no problem pain over the side of the boat.  He, on the other hand, would have to shift his pelvis back-and-forth through relax in order to relieve his bladder.  PVR 308 mL   He was seen on September 08, 2023 for an episodic visit at [facility] for scrotal pain and pain at the base of his penis.  He was prescribed Flomax tamsulosin 0.8 mg daily and started on Cipro 500 mg for 7 days along with a steroid taper.  I PSS 25/6  PVR 254 mL   UA yellow clear, specific gravity 1.015, pH 6.5, 0-5 WBCs, 0-2 RBCs and 0-10 epithelial  cells.    He is having urinary frequency, urgency, difficulty urinating, postvoid leakage and a weak urinary stream.  He also states that he is still having issues with back pain.  He is having pain down his left leg that starts from his buttocks.  He is also having issues with constipation.  He is taking the tamsulosin 0.8 mg daily and he does not like the way it feels.   He is also having issues with erections.   He does not have spontaneous erections, he has no occurs with erections or pain with erections.   IPSS     Row Name 10/10/23 1000         International Prostate Symptom Score   How often have you had the sensation of not emptying your bladder? More than half the time     How often have you had to urinate less than every two hours? Almost always     How often have you found you stopped and started again several times when you urinated? About half the time     How often have you found it difficult to postpone urination? More than half the time     How often have you had a weak urinary stream? About half the time     How often have you had to strain to start urination? Less than 1 in 5 times     How  many times did you typically get up at night to urinate? 5 Times     Total IPSS Score 25       Quality of Life due to urinary symptoms   If you were to spend the rest of your life with your urinary condition just the way it is now how would you feel about that? Terrible              Score:  1-7 Mild 8-19 Moderate 20-35 Severe   PMH: Past Medical History:  Diagnosis Date   Allergy    COVID-19    DDD (degenerative disc disease), lumbar    GERD (gastroesophageal reflux disease)    Hyperlipidemia    Hypertension    IBS (irritable bowel syndrome)    Lumbar spondylosis    Sleep apnea    mouth gaurd   Traumatic pneumothorax 12/04/2015   Vitamin D deficiency     Surgical History: Past Surgical History:  Procedure Laterality Date   CHEST TUBE INSERTION Right  05/15/2002   thoracostomy   COLONOSCOPY     ESOPHAGOGASTRODUODENOSCOPY (EGD) WITH PROPOFOL N/A 12/15/2020   Procedure: ESOPHAGOGASTRODUODENOSCOPY (EGD) WITH PROPOFOL;  Surgeon: Earline Mayotte, MD;  Location: [facility] ENDOSCOPY;  Service: Endoscopy;  Laterality: N/A;  with possible dilation   LUMBAR LAMINECTOMY/DECOMPRESSION MICRODISCECTOMY N/A 07/28/2023   Procedure: L3-5 POSTERIOR SPINAL DECOMPRESSION;  Surgeon: Venetia Night, MD;  Location: [facility] ORS;  Service: Neurosurgery;  Laterality: N/A;   TOE SURGERY  08/05/2006   right big toe    Home Medications:  Allergies as of 10/10/2023       Reactions   Lipitor [atorvastatin]    Myalgias   Prozac [fluoxetine Hcl] Other (See Comments)   Did not like the way it made him feel/think   Rosuvastatin    myalgias   Simvastatin    Myalgias        Medication List        Accurate as of October 10, 2023 11:59 PM. If you have any questions, ask your nurse or doctor.          STOP taking these medications    ibuprofen 200 MG tablet Commonly known as: ADVIL   predniSONE 10 MG tablet Commonly known as: DELTASONE       TAKE these medications    acetaminophen 500 MG tablet Commonly known as: TYLENOL Take 500-1,000 mg by mouth every 6 (six) hours as needed (pain).   ciprofloxacin 500 MG tablet Commonly known as: Cipro Take 1 tablet (500 mg total) by mouth 2 (two) times daily.   COENZYME Q10 PO Take 1 capsule by mouth in the morning.   famotidine 20 MG tablet Commonly known as: PEPCID Take  1 tablet  2 x /day to Prevent Heart burn & Indigestion                                                           /                                       TAKE  BY                                              MOUTH What changed:  how much to take how to take this when to take this reasons to take this additional instructions   hydrocortisone 25 MG suppository Commonly known as:  ANUSOL-HC Insert 1 rectal suppository 2 x /day   lidocaine-prilocaine cream Commonly known as: EMLA APPLY TOPICALLY AS NEEDED   nitroGLYCERIN 2 % ointment Commonly known as: NITROGLYN Apply   1/2 "  to Hemorrhoid  every 6 hours  as needed   Procto-Med HC 2.5 % rectal cream Generic drug: hydrocortisone APPLY CREAM  RECTALLY 4 TIMES DAILY What changed: See the new instructions.   tadalafil 5 MG tablet Commonly known as: CIALIS Take one 5 mg tablet daily for BPH   tamsulosin 0.4 MG Caps capsule Commonly known as: FLOMAX Take 2 capsules (0.8 mg total) by mouth daily.        Allergies:  Allergies  Allergen Reactions   Lipitor [Atorvastatin]     Myalgias   Prozac [Fluoxetine Hcl] Other (See Comments)    Did not like the way it made him feel/think   Rosuvastatin     myalgias   Simvastatin     Myalgias    Family History: Family History  Problem Relation Age of Onset   Cancer Mother        lung   Hyperlipidemia Mother    Hypertension Father    Hyperlipidemia Father    Dementia Brother    Hyperlipidemia Brother    Hyperlipidemia Brother    Hyperlipidemia Brother    Colon cancer Maternal Grandmother    Diabetes Maternal Grandfather     Social History:  reports that he quit smoking about 22 years ago. His smoking use included cigarettes. He has never used smokeless tobacco. He reports current alcohol use of about 10.0 standard drinks of alcohol per week. He reports that he does not use drugs.  ROS: Pertinent ROS in HPI  Physical Exam: Blood pressure (!) 144/92, pulse 98.  Constitutional:  Well nourished. Alert and oriented, No acute distress. HEENT: [place] AT, moist mucus membranes.  Trachea midline Cardiovascular: No clubbing, cyanosis, or edema. Respiratory: Normal respiratory effort, no increased work of breathing. Neurologic: Grossly intact, no focal deficits, moving all 4 extremities. Psychiatric: Normal mood and affect.   Laboratory  Data: N/A   Pertinent Imaging:  10/12/23 22:34  Scan Result 254 mL    Assessment & Plan:    1. Urinary retention (Primary) - Bladder Scan (Post Void Residual) in office - PVR demonstrates adequate bladder emptying  2. Constipation - Encouraged him to continue his bowel regimen recommended by his back surgeon  3.  Pelvic floor dysfunction/BPH -He would like a different medication than the tamsulosin, so since he is having some issues with erectile dysfunction we will switch to tadalafil 5 mg daily and I sent that to the pharmacy -His recent back surgery has been difficult for him as he is not an individual that is used to sitting around and he is not as active as he would like to be.   -He may benefit from a bladder outlet procedure, but that we will need to be further down the line once he has recovered from his back surgery  Return in about 1 month (around 11/10/2023)  for IPSS and PVR.  These notes generated with voice recognition software. I apologize for typographical errors.  Cloretta Ned  [facility] [address]  [address] [place], [place] [address] [phone]

## 2023-10-10 ENCOUNTER — Ambulatory Visit: Admitting: Urology

## 2023-10-10 VITALS — BP 144/92 | HR 98

## 2023-10-10 DIAGNOSIS — R102 Pelvic and perineal pain: Secondary | ICD-10-CM

## 2023-10-10 DIAGNOSIS — M6289 Other specified disorders of muscle: Secondary | ICD-10-CM

## 2023-10-10 DIAGNOSIS — N401 Enlarged prostate with lower urinary tract symptoms: Secondary | ICD-10-CM

## 2023-10-10 DIAGNOSIS — R339 Retention of urine, unspecified: Secondary | ICD-10-CM

## 2023-10-10 DIAGNOSIS — N138 Other obstructive and reflux uropathy: Secondary | ICD-10-CM

## 2023-10-10 LAB — URINALYSIS, COMPLETE
Bilirubin, UA: NEGATIVE
Glucose, UA: NEGATIVE
Ketones, UA: NEGATIVE
Leukocytes,UA: NEGATIVE
Nitrite, UA: NEGATIVE
Protein,UA: NEGATIVE
RBC, UA: NEGATIVE
Specific Gravity, UA: 1.015 (ref 1.005–1.030)
Urobilinogen, Ur: 0.2 mg/dL (ref 0.2–1.0)
pH, UA: 6.5 (ref 5.0–7.5)

## 2023-10-10 LAB — MICROSCOPIC EXAMINATION: Bacteria, UA: NONE SEEN

## 2023-10-10 MED ORDER — TADALAFIL 5 MG PO TABS: ORAL_TABLET | ORAL | 3 refills | Status: DC

## 2023-10-12 ENCOUNTER — Encounter: Payer: Self-pay | Admitting: Urology

## 2023-10-12 LAB — BLADDER SCAN AMB NON-IMAGING: Scan Result: 254

## 2023-10-16 ENCOUNTER — Ambulatory Visit (INDEPENDENT_AMBULATORY_CARE_PROVIDER_SITE_OTHER): Payer: BC Managed Care – PPO | Admitting: Neurosurgery

## 2023-10-16 VITALS — BP 132/82 | Ht 71.0 in | Wt 227.0 lb

## 2023-10-16 DIAGNOSIS — Z09 Encounter for follow-up examination after completed treatment for conditions other than malignant neoplasm: Secondary | ICD-10-CM

## 2023-10-16 DIAGNOSIS — M48062 Spinal stenosis, lumbar region with neurogenic claudication: Secondary | ICD-10-CM

## 2023-10-16 NOTE — Progress Notes (Signed)
   REFERRING PHYSICIAN:  Lucky Cowboy, Md [address] [place],  [place] [address]  DOS: 07/28/23 L3-5 decompression and CSF leak repair  HISTORY OF PRESENT ILLNESS: Matthew Salinas is approximately 3 months status post above surgery. he is doing well despite some continued numbness left leg, urinary urgency, and constipation.  PHYSICAL EXAMINATION:  General: Patient is well developed, well nourished, calm, collected, and in no apparent distress.   NEUROLOGICAL:  General: In no acute distress.   Awake, alert, oriented to person, place, and time.  Pupils equal round and reactive to light.    Strength:            Side Iliopsoas Quads Hamstring PF DF EHL  R 5 5 5 5 5 5   L 5 5 5 5 5 5    Incision well healed   ROS (Neurologic):  Negative except as noted above  IMAGING: No interval imaging to review  ASSESSMENT/PLAN:  Matthew Salinas is doing fairly well approximately 3 months after lumbar decompression. He is now released from our standpoint to resume activities as tolerated.  He is concerned about returning to work and would like to stay out on FMLA for another 6 to 8 weeks.  he will follow up with Dr. Myer Haff in 6 months or sooner should he have any questions or concerns.  Manning Charity PA-C Department of neurosurgery

## 2023-10-23 ENCOUNTER — Other Ambulatory Visit: Payer: Self-pay

## 2023-10-23 DIAGNOSIS — M5440 Lumbago with sciatica, unspecified side: Secondary | ICD-10-CM

## 2023-10-23 MED ORDER — CELECOXIB 100 MG PO CAPS: ORAL_CAPSULE | ORAL | 0 refills | Status: AC

## 2023-11-11 ENCOUNTER — Ambulatory Visit: Admitting: Urology

## 2023-11-14 ENCOUNTER — Ambulatory Visit: Admitting: Urology

## 2023-11-14 ENCOUNTER — Encounter: Payer: Self-pay | Admitting: Urology

## 2023-11-14 VITALS — BP 137/82 | HR 81 | Ht 71.0 in | Wt 220.0 lb

## 2023-11-14 DIAGNOSIS — N529 Male erectile dysfunction, unspecified: Secondary | ICD-10-CM | POA: Diagnosis not present

## 2023-11-14 DIAGNOSIS — R339 Retention of urine, unspecified: Secondary | ICD-10-CM

## 2023-11-14 LAB — BLADDER SCAN AMB NON-IMAGING

## 2023-11-14 MED ORDER — TAMSULOSIN HCL 0.4 MG PO CAPS: 0.8000 mg | ORAL_CAPSULE | Freq: Every day | ORAL | 11 refills | Status: DC

## 2023-11-14 NOTE — Progress Notes (Signed)
I, Matthew Salinas, acting as a scribe for Matthew Altes, MD., have documented all relevant documentation on the behalf of Matthew Altes, MD, as directed by Matthew Altes, MD while in the presence of Matthew Altes, MD.  11/14/2023 7:13 PM   Matthew Salinas 1966-05-09 [id number]  Referring provider: Lucky Cowboy, MD [address] [place],  [place] [address]  Chief Complaint  Patient presents with   Benign Prostatic Hypertrophy    HPI: Matthew Salinas is a 58 y.o. male presents for follow-up visit.   Refer to my previous office note 08/25/2023.  His Foley catheter was removed 09/01/23 and PVR was 265 mL. A repeat bladder scan the following day was 308 mL.  His last follow-up was 10/10/2023. PVR at that visit was 254 mL. He was also started on Tadalafil for erectile dysfunction 5 mg daily. Since his last visit, he does feel like he is voiding better. He is voiding with a good stream. He gets up twice per night to void. IPSS today was 8/35. He has noted significant decrease in his ejaculate. He also continued to have erectile dysfunction, and did not see any improvement with daily tadalafil. His perineal pain has significantly improved. He has occasional intermittent scrotal pain.    PMH: Past Medical History:  Diagnosis Date   Allergy    COVID-19    DDD (degenerative disc disease), lumbar    GERD (gastroesophageal reflux disease)    Hyperlipidemia    Hypertension    IBS (irritable bowel syndrome)    Lumbar spondylosis    Sleep apnea    mouth gaurd   Traumatic pneumothorax 12/04/2015   Vitamin D deficiency     Surgical History: Past Surgical History:  Procedure Laterality Date   CHEST TUBE INSERTION Right 05/15/2002   thoracostomy   COLONOSCOPY     ESOPHAGOGASTRODUODENOSCOPY (EGD) WITH PROPOFOL N/A 12/15/2020   Procedure: ESOPHAGOGASTRODUODENOSCOPY (EGD) WITH PROPOFOL;  Surgeon: Earline Mayotte, MD;  Location: [facility] ENDOSCOPY;  Service:  Endoscopy;  Laterality: N/A;  with possible dilation   LUMBAR LAMINECTOMY/DECOMPRESSION MICRODISCECTOMY N/A 07/28/2023   Procedure: L3-5 POSTERIOR SPINAL DECOMPRESSION;  Surgeon: Venetia Night, MD;  Location: [facility] ORS;  Service: Neurosurgery;  Laterality: N/A;   TOE SURGERY  08/05/2006   right big toe    Home Medications:  Allergies as of 11/14/2023       Reactions   Lipitor [atorvastatin]    Myalgias   Prozac [fluoxetine Hcl] Other (See Comments)   Did not like the way it made him feel/think   Rosuvastatin    myalgias   Simvastatin    Myalgias        Medication List        Accurate as of November 14, 2023  7:13 PM. If you have any questions, ask your nurse or doctor.          acetaminophen 500 MG tablet Commonly known as: TYLENOL Take 500-1,000 mg by mouth every 6 (six) hours as needed (pain).   celecoxib 100 MG capsule Commonly known as: CELEBREX Take 1 tab q 12 hours as needed   ciprofloxacin 500 MG tablet Commonly known as: Cipro Take 1 tablet (500 mg total) by mouth 2 (two) times daily.   COENZYME Q10 PO Take 1 capsule by mouth in the morning.   famotidine 20 MG tablet Commonly known as: PEPCID Take  1 tablet  2 x /day to Prevent Heart burn & Indigestion                                                           /  TAKE                                            BY                                              MOUTH What changed:  how much to take how to take this when to take this reasons to take this additional instructions   hydrocortisone 25 MG suppository Commonly known as: ANUSOL-HC Insert 1 rectal suppository 2 x /day   lidocaine-prilocaine cream Commonly known as: EMLA APPLY TOPICALLY AS NEEDED   nitroGLYCERIN 2 % ointment Commonly known as: NITROGLYN Apply   1/2 "  to Hemorrhoid  every 6 hours  as needed   Procto-Med HC 2.5 % rectal cream Generic drug: hydrocortisone APPLY CREAM  RECTALLY 4 TIMES  DAILY What changed: See the new instructions.   tadalafil 5 MG tablet Commonly known as: CIALIS Take one 5 mg tablet daily for BPH   tamsulosin 0.4 MG Caps capsule Commonly known as: FLOMAX Take 2 capsules (0.8 mg total) by mouth daily.        Allergies:  Allergies  Allergen Reactions   Lipitor [Atorvastatin]     Myalgias   Prozac [Fluoxetine Hcl] Other (See Comments)    Did not like the way it made him feel/think   Rosuvastatin     myalgias   Simvastatin     Myalgias    Family History: Family History  Problem Relation Age of Onset   Cancer Mother        lung   Hyperlipidemia Mother    Hypertension Father    Hyperlipidemia Father    Dementia Brother    Hyperlipidemia Brother    Hyperlipidemia Brother    Hyperlipidemia Brother    Colon cancer Maternal Grandmother    Diabetes Maternal Grandfather     Social History:  reports that he quit smoking about 22 years ago. His smoking use included cigarettes. He has never used smokeless tobacco. He reports current alcohol use of about 10.0 standard drinks of alcohol per week. He reports that he does not use drugs.   Physical Exam: BP 137/82   Pulse 81   Ht 5\' 11"  (1.803 m)   Wt 220 lb (99.8 kg)   BMI 30.68 kg/m   Constitutional:  Alert and oriented, No acute distress. HEENT: [place] AT Respiratory: Normal respiratory effort, no increased work of breathing. Psychiatric: Normal mood and affect.   Assessment & Plan:    1. Incomplete bladder emptying PVR today is significantly improved at 180 mL, suggesting improvement in bladder function. This may be secondary to bladder hypotonicity, present preoperatively. Discussed that decreased ejaculate is most likely secondary to Tamsulosin. Plan to decrease Tamsulosin to 0.4 mg daily. If issues persist, consider switching to Alfuzosin.  2. Erectile dysfunction Discussed the option of adding 20 mg Tadalafil to take one hour prior to intercourse, but the patient does not  desire additional medication. Recommended continuing daily Tadalafil to maintain oxygenated blood flow to the corporal tissue.  I have reviewed the above documentation for accuracy and completeness, and I agree with the above.   Geraline Knapp, MD  [facility] [address] [place], [place] [address] [phone]  [phone]  

## 2023-11-15 ENCOUNTER — Encounter: Payer: Self-pay | Admitting: Urology

## 2023-12-12 ENCOUNTER — Ambulatory Visit: Admission: EM | Admit: 2023-12-12 | Discharge: 2023-12-12 | Disposition: A | Discharge status: Home / Self Care

## 2023-12-12 DIAGNOSIS — K625 Hemorrhage of anus and rectum: Secondary | ICD-10-CM | POA: Insufficient documentation

## 2023-12-12 DIAGNOSIS — R1310 Dysphagia, unspecified: Secondary | ICD-10-CM | POA: Insufficient documentation

## 2023-12-12 DIAGNOSIS — H6122 Impacted cerumen, left ear: Secondary | ICD-10-CM

## 2023-12-12 DIAGNOSIS — H60392 Other infective otitis externa, left ear: Secondary | ICD-10-CM | POA: Diagnosis not present

## 2023-12-12 IMAGING — CR DG LUMBAR SPINE COMPLETE 4+V
5 series · 5 of 5 positions shown · non-contrast
Comparison: Lumbar spine x-ray 09/25/2012

CLINICAL DATA: Low back pain

EXAM:
LUMBAR SPINE - COMPLETE 4+ VIEW

[w lumbar spine ap]
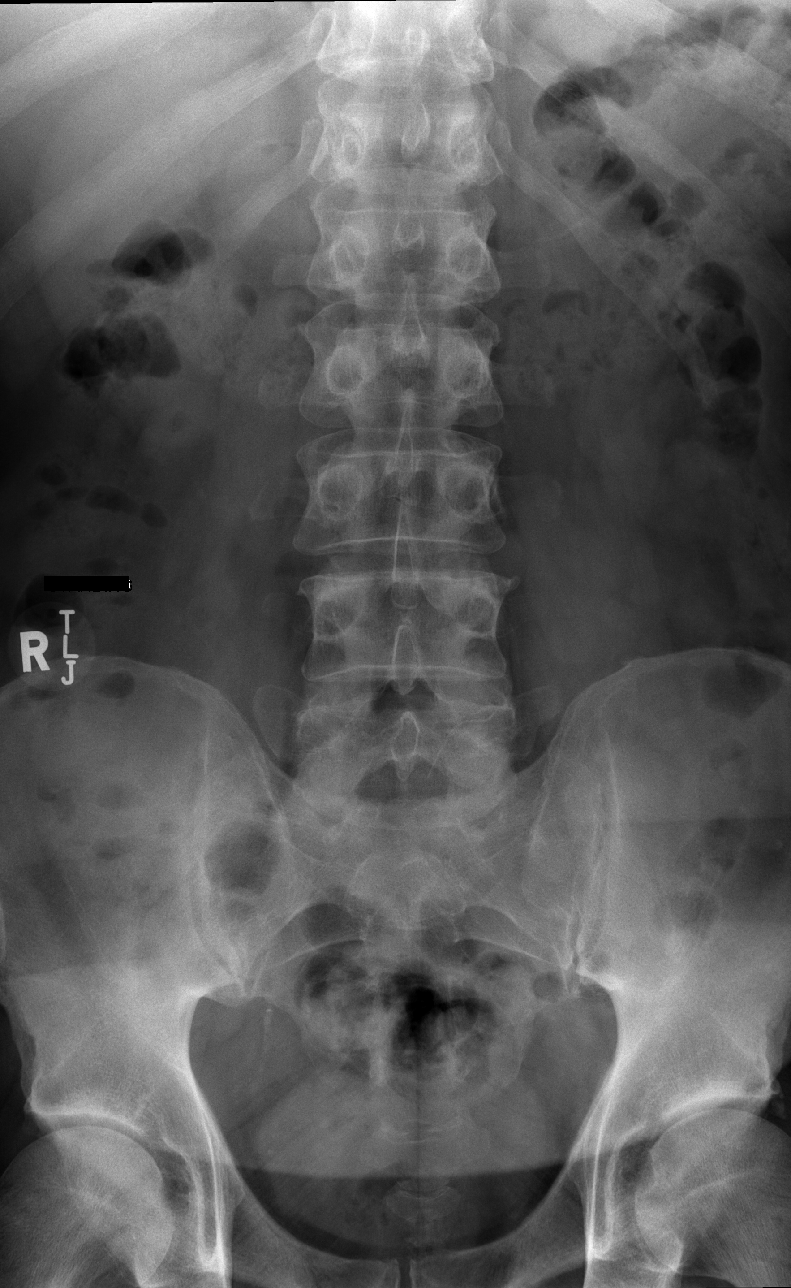

[w lumbar spine obl (1 of 2)]
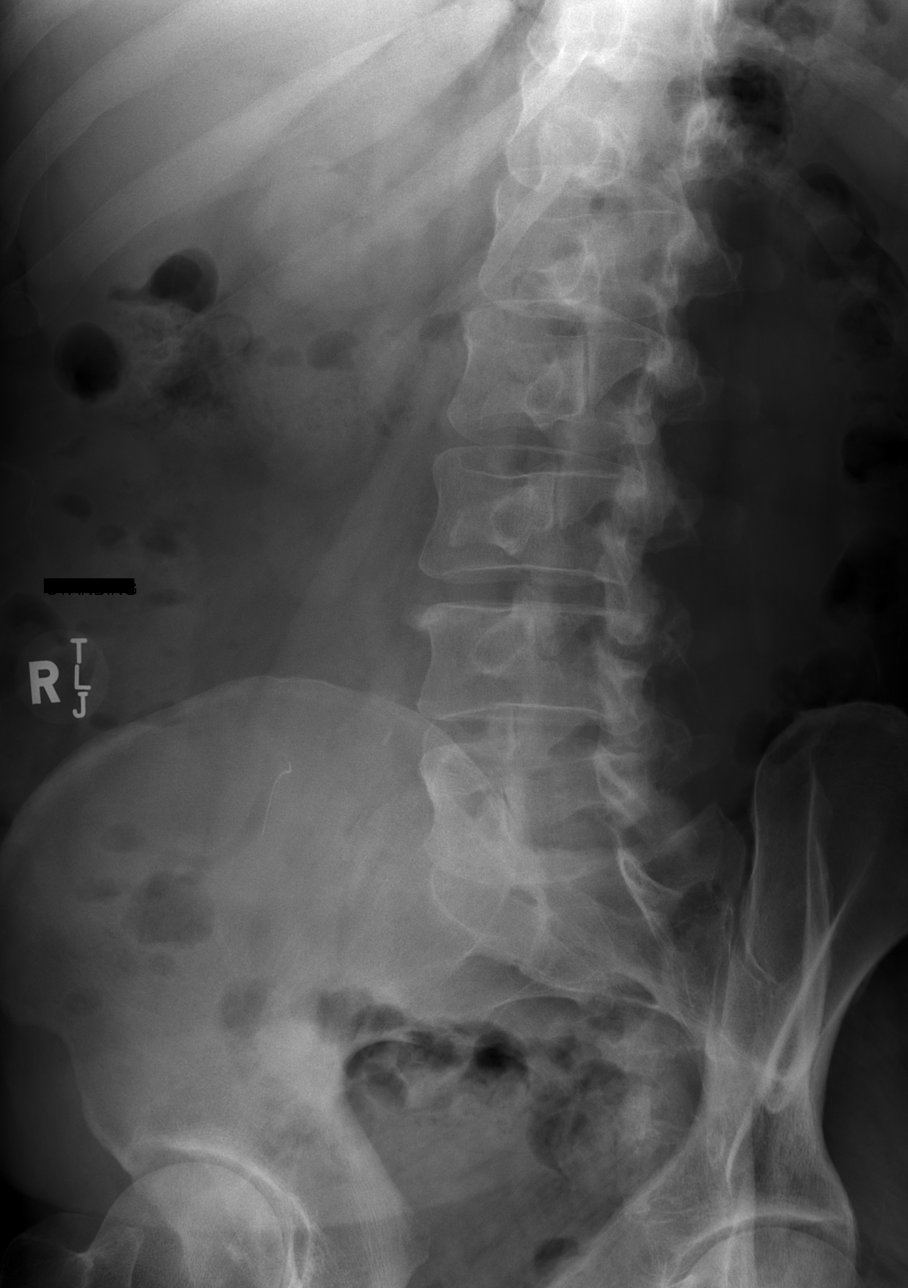

[w lumbar spine obl (2 of 2)]
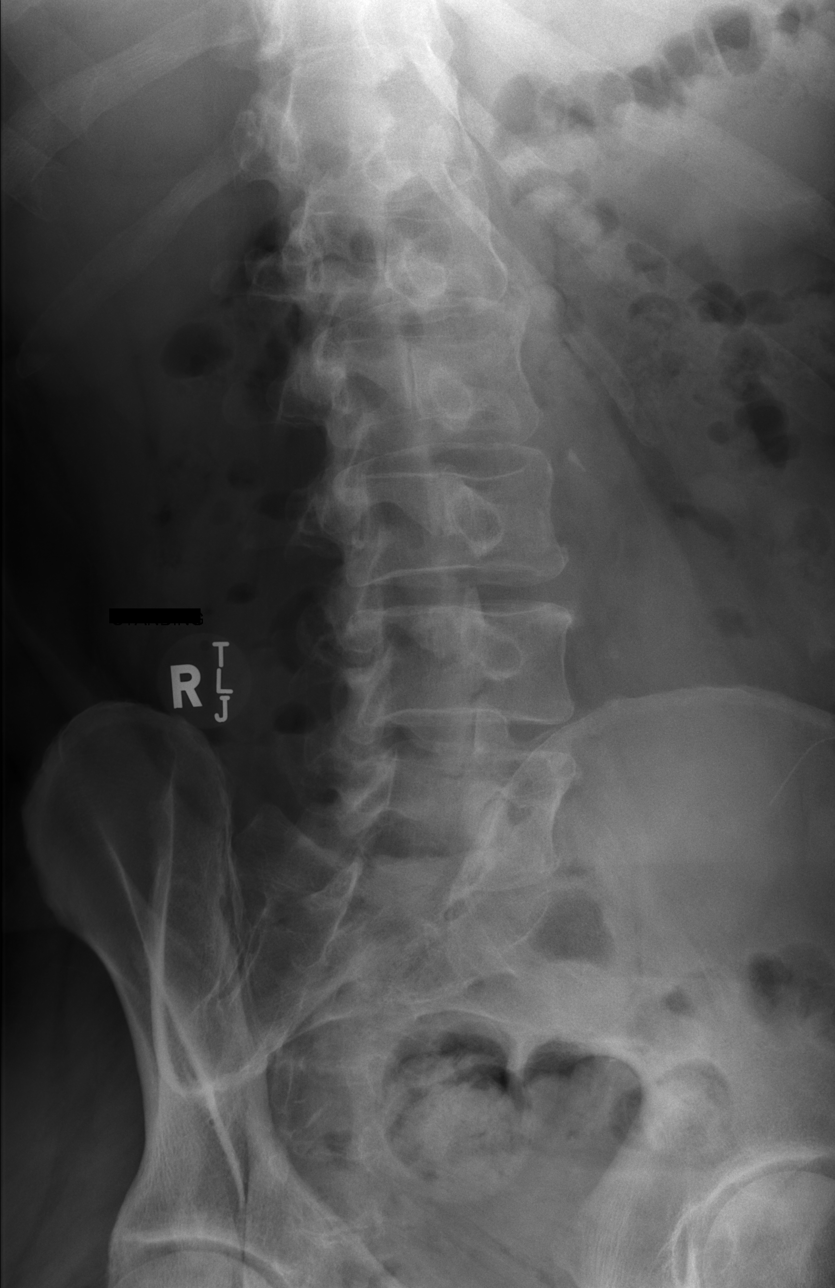

[w lumbar spine lat]
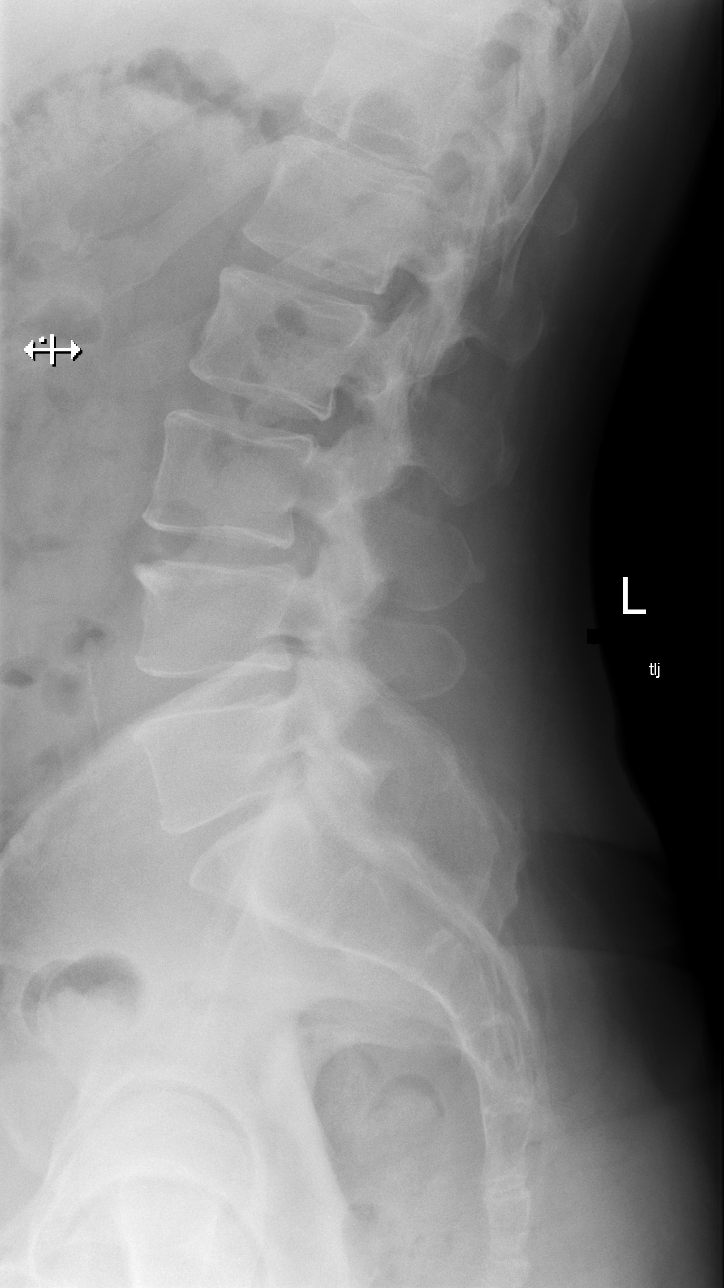

[w lumbar l-5 s-1 spot]
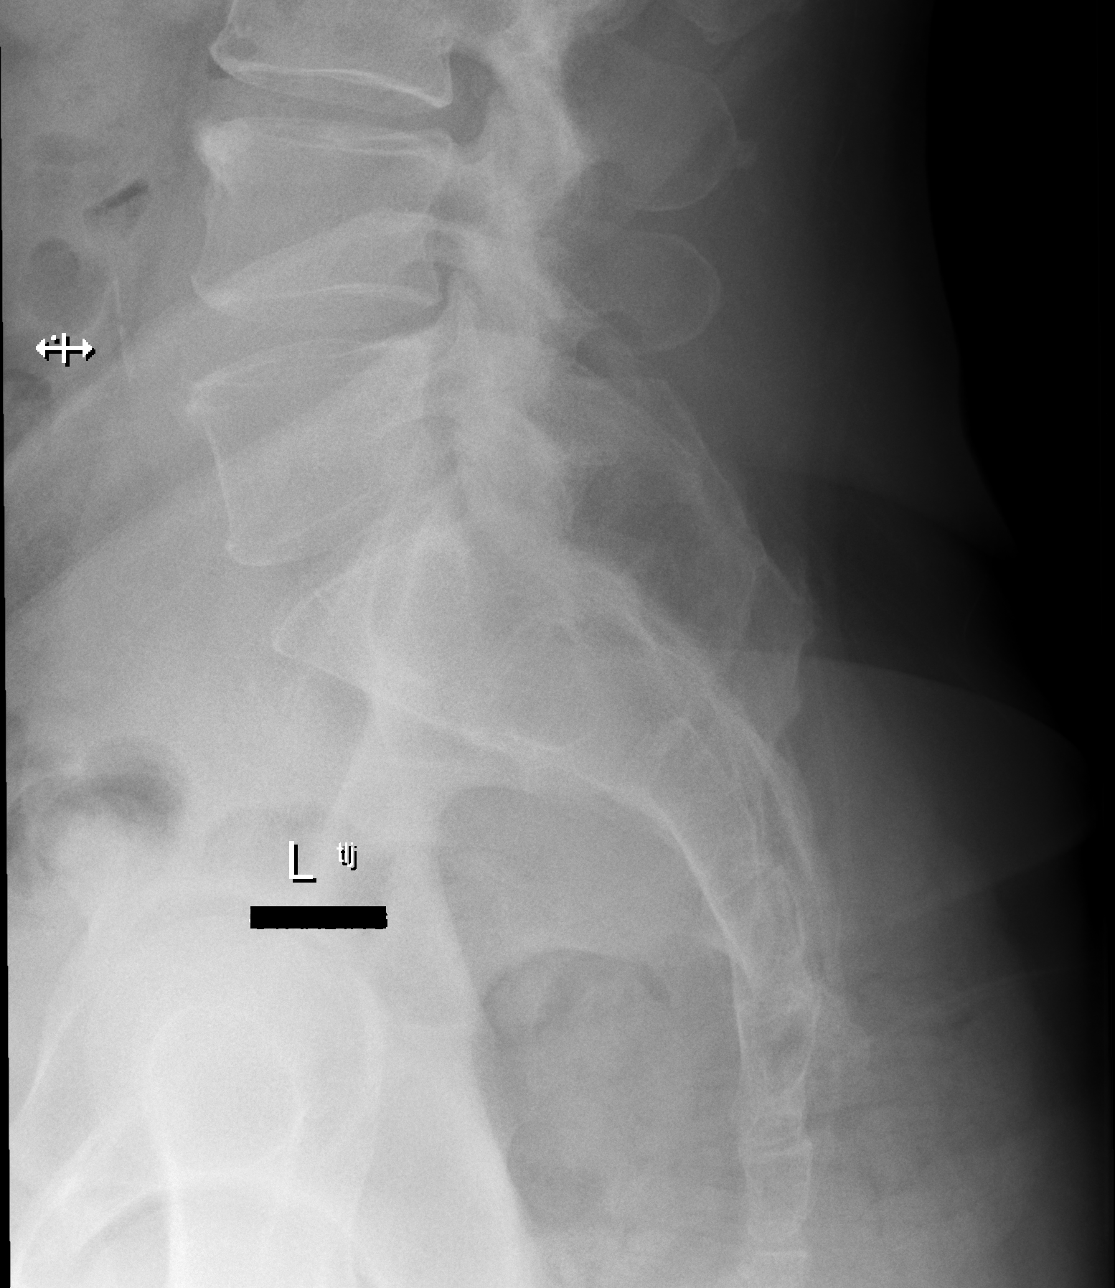

[5 of 5 positions shown; findings below may reference images not displayed]

FINDINGS: Lumbar vertebral body height are preserved without evidence of
fracture. Minimal grade 1 anterolisthesis of L3 on L4. Mild
intervertebral disc space narrowing at L5-S1. No spondylolysis
identified. Mild facet arthropathy in the lower lumbar spine.
IMPRESSION: Mild degenerative changes with no acute fracture identified.

## 2023-12-12 MED ORDER — CIPROFLOXACIN-DEXAMETHASONE 0.3-0.1 % OT SUSP: 4.0000 [drp] | Freq: Two times a day (BID) | OTIC | 0 refills | Status: DC

## 2023-12-12 NOTE — Discharge Instructions (Signed)
You have an ear infection of the ear canal known as otitis externa. Use ear drops as prescribed for 7 days. Do not place anything smaller than elbow deep into ear canal- this includes Q-tips. You may place a small amount of rubbing alcohol onto the end of a Q-tip and place this into the outer ear canal to dry up any remaining water that may have gotten into the ear while showering or submerging head underwater to prevent this type of infection in the future.

## 2023-12-12 NOTE — ED Triage Notes (Signed)
"  My life ear is bothering me, I hear this constant hum, not really pain, I have had this before". No fever. No injury.

## 2023-12-12 NOTE — ED Provider Notes (Signed)
 [facility]    CSN: [id number] Arrival date & time: 12/12/23  0803      History   Chief Complaint Chief Complaint  Patient presents with   Ear Problem    HPI Matthew Salinas is a 58 y.o. male.   Matthew Salinas is a 58 y.o. male presenting for chief complaint of ear problem. He complains of a "whooshing" sound and sensation to the left ear that started a few days ago. He recently returned from the beach and wonders if submerging his head in water is contributing to the ear problem. The ear is "irritated" but not painful. Denies drainage from the ears, dizziness, fever, chills, neck pain, viral URI symptoms, and recent antibiotic use. Hearing is decreased from the left ear. Right ear is asymptomatic. He has had this problem in the past and has been using Flonase/Nasonex  with minimal relief every 12 hours for the last 1 week.      Past Medical History:  Diagnosis Date   Allergy    COVID-19    DDD (degenerative disc disease), lumbar    GERD (gastroesophageal reflux disease)    Hyperlipidemia    Hypertension    IBS (irritable bowel syndrome)    Lumbar spondylosis    Sleep apnea    mouth gaurd   Traumatic pneumothorax 12/04/2015   Vitamin D  deficiency     Patient Active Problem List   Diagnosis Date Noted   Dysphagia 12/12/2023   Bleeding per rectum 12/12/2023   Anal fissure 09/01/2023   Neurogenic claudication due to lumbar spinal stenosis 07/28/2023   Severe sleep apnea 06/05/2021   Snoring 05/07/2021   Choking 05/07/2021   Difficulty staying asleep 05/07/2021   Non-restorative sleep 05/07/2021   Tonsillar hypertrophy 05/07/2021   Statin myopathy 03/01/2020   Overweight (BMI 25.0-29.9) 08/17/2017   Snorings (without sleep apnea) 12/04/2015   Rhinitis due to pollen 12/04/2015   Acid reflux 09/19/2015   Chronic anxiety 09/19/2015   Hx of pneumothorax 05/16/2014   Essential hypertension 09/19/2013   Hyperlipidemia, mixed 09/19/2013   Abnormal  glucose 09/19/2013   Vitamin D  deficiency 09/19/2013   Medication management 09/19/2013    Past Surgical History:  Procedure Laterality Date   CHEST TUBE INSERTION Right 05/15/2002   thoracostomy   COLONOSCOPY     ESOPHAGOGASTRODUODENOSCOPY (EGD) WITH PROPOFOL  N/A 12/15/2020   Procedure: ESOPHAGOGASTRODUODENOSCOPY (EGD) WITH PROPOFOL ;  Surgeon: Marshall Skeeter, MD;  Location: [facility];  Service: Endoscopy;  Laterality: N/A;  with possible dilation   LUMBAR LAMINECTOMY/DECOMPRESSION MICRODISCECTOMY N/A 07/28/2023   Procedure: L3-5 POSTERIOR SPINAL DECOMPRESSION;  Surgeon: Jodeen Munch, MD;  Location: [facility] ORS;  Service: Neurosurgery;  Laterality: N/A;   TOE SURGERY  08/05/2006   right big toe       Home Medications    Prior to Admission medications   Medication Sig Start Date End Date Taking? Authorizing Provider  ciprofloxacin -dexamethasone  (CIPRODEX) OTIC suspension Place 4 drops into the left ear 2 (two) times daily. 12/12/23  Yes Starlene Eaton, FNP  nitroGLYCERIN  (NITROGLYN) 2 % ointment Apply 0.5 inches topically as needed. 07/28/19  Yes [provider]  tadalafil  (CIALIS ) 5 MG tablet Take one 5 mg tablet daily for BPH 10/10/23  Yes McGowan, Cathleen Coach A, PA-C  tamsulosin  (FLOMAX ) 0.4 MG CAPS capsule Take 2 capsules (0.8 mg total) by mouth daily. 11/14/23  Yes Stoioff, Kizzie Perks, MD  acetaminophen  (TYLENOL ) 500 MG tablet Take 500-1,000 mg by mouth every 6 (six) hours as needed (pain).  [provider]  celecoxib  (CELEBREX ) 100 MG capsule Take 1 tab q 12 hours as needed 10/23/23   Webb, Padonda B, FNP  ciprofloxacin  (CIPRO ) 500 MG tablet Take 1 tablet (500 mg total) by mouth 2 (two) times daily. 09/08/23   Cranford, Tonya, NP  COENZYME Q10 PO Take 1 capsule by mouth in the morning.    [provider]  famotidine  (PEPCID ) 20 MG tablet Take  1 tablet  2 x /day to Prevent Heart burn & Indigestion                                                            /                                       TAKE                                            BY                                              MOUTH Patient taking differently: Take 20 mg by mouth 2 (two) times daily as needed for indigestion or heartburn. 11/25/22   Vangie Genet, MD  hydrocortisone  (ANUSOL -HC) 25 MG suppository Insert 1 rectal suppository 2 x /day 09/08/23   Cranford, Tonya, NP  lidocaine -prilocaine  (EMLA ) cream APPLY TOPICALLY AS NEEDED 01/15/18   Vangie Genet, MD  lisinopril -hydrochlorothiazide  (ZESTORETIC ) 20-25 MG tablet Take 1 tablet by mouth daily.    [provider]  nitroGLYCERIN  (NITROGLYN) 2 % ointment Apply   1/2 "  to Hemorrhoid  every 6 hours  as needed 05/02/22   Vangie Genet, MD  PROCTO-MED HC  2.5 % rectal cream APPLY CREAM  RECTALLY 4 TIMES DAILY Patient taking differently: Place 1 Application rectally 4 (four) times daily as needed for hemorrhoids. 07/04/20   Vangie Genet, MD    Family History Family History  Problem Relation Age of Onset   Cancer Mother        lung   Hyperlipidemia Mother    Hypertension Father    Hyperlipidemia Father    Dementia Brother    Hyperlipidemia Brother    Hyperlipidemia Brother    Hyperlipidemia Brother    Colon cancer Maternal Grandmother    Diabetes Maternal Grandfather     Social History Social History   Tobacco Use   Smoking status: Former    Current packs/day: 0.00    Types: Cigarettes    Quit date: 08/05/2001    Years since quitting: 22.3   Smokeless tobacco: Never   Tobacco comments:    Smoked 5-10 cigarettes per day since age 49  Vaping Use   Vaping status: Never Used  Substance Use Topics   Alcohol use: Yes    Alcohol/week: 10.0 standard drinks of alcohol    Types: 10 Standard drinks or equivalent per week   Drug use: No     Allergies   Lipitor [atorvastatin], Prozac [fluoxetine hcl],  Rosuvastatin , and Simvastatin    Review of Systems Review of Systems Per HPI  Physical  Exam Triage Vital Signs ED Triage Vitals  Encounter Vitals Group     BP 12/12/23 0830 136/86     Systolic BP Percentile --      Diastolic BP Percentile --      Pulse Rate 12/12/23 0830 78     Resp 12/12/23 0830 18     Temp 12/12/23 0830 97.7 F (36.5 C)     Temp Source 12/12/23 0830 Oral     SpO2 12/12/23 0830 98 %     Weight 12/12/23 0828 220 lb 0.3 oz (99.8 kg)     Height 12/12/23 0828 5\' 11"  (1.803 m)     Head Circumference --      Peak Flow --      Pain Score 12/12/23 0825 0     Pain Loc --      Pain Education --      Exclude from Growth Chart --    No data found.  Updated Vital Signs BP 136/86 (BP Location: Right Arm)   Pulse 78   Temp 97.7 F (36.5 C) (Oral)   Resp 18   Ht 5\' 11"  (1.803 m)   Wt 220 lb 0.3 oz (99.8 kg)   SpO2 98%   BMI 30.69 kg/m   Visual Acuity Right Eye Distance:   Left Eye Distance:   Bilateral Distance:    Right Eye Near:   Left Eye Near:    Bilateral Near:     Physical Exam Vitals and nursing note reviewed.  Constitutional:      Appearance: He is not ill-appearing or toxic-appearing.  HENT:     Head: Normocephalic and atraumatic.     Right Ear: Hearing, tympanic membrane, ear canal and external ear normal.     Left Ear: Hearing, tympanic membrane, ear canal and external ear normal. Drainage (thick purulent drainage to left ear canal on reassessment after ear wax impaction removal) and tenderness present. There is impacted cerumen. Tympanic membrane is not perforated or erythematous.     Nose: Nose normal.     Mouth/Throat:     Lips: Pink.     Mouth: Mucous membranes are moist. No injury or oral lesions.     Dentition: Normal dentition.     Tongue: No lesions.     Pharynx: Oropharynx is clear. Uvula midline. No pharyngeal swelling, oropharyngeal exudate, posterior oropharyngeal erythema, uvula swelling or postnasal drip.     Tonsils: No tonsillar exudate.  Eyes:     General: Lids are normal. Vision grossly intact. Gaze aligned  appropriately.     Extraocular Movements: Extraocular movements intact.     Conjunctiva/sclera: Conjunctivae normal.  Neck:     Trachea: Trachea and phonation normal.  Pulmonary:     Effort: Pulmonary effort is normal.  Musculoskeletal:     Cervical back: Neck supple.  Lymphadenopathy:     Cervical: No cervical adenopathy.  Skin:    General: Skin is warm and dry.     Capillary Refill: Capillary refill takes less than 2 seconds.     Findings: No rash.  Neurological:     General: No focal deficit present.     Mental Status: He is alert and oriented to person, place, and time. Mental status is at baseline.     Cranial Nerves: No dysarthria or facial asymmetry.  Psychiatric:        Mood and Affect: Mood normal.  Speech: Speech normal.        Behavior: Behavior normal.        Thought Content: Thought content normal.        Judgment: Judgment normal.      UC Treatments / Results  Labs (all labs ordered are listed, but only abnormal results are displayed) Labs Reviewed - No data to display  EKG   Radiology No results found.  Procedures Procedures (including critical care time)  Medications Ordered in UC Medications - No data to display  Initial Impression / Assessment and Plan / UC Course  I have reviewed the triage vital signs and the nursing notes.  Pertinent labs & imaging results that were available during my care of the patient were reviewed by me and considered in my medical decision making (see chart for details).   1.  Infective otitis externa of left ear, impacted cerumen of left ear Presentation consistent with otitis externa. Nursing staff and provider both provided ear lavage to the left ear fully removing earwax with lavage and curette.  Patient tolerated procedure.  Reassessment shows purulent drainage to the floor of the left ear canal and erythema of the left ear canal.    Will manage this with Ciprodex otic drops as prescribed for 7 days since I  am able to see the eardrum of the affected ear. Encouraged to avoid getting water into affected ear(s) for at least 7-10 days. Over the counter medications as needed for pain.    Counseled patient on potential for adverse effects with medications prescribed/recommended today, strict ER and return-to-clinic precautions discussed, patient verbalized understanding.    Final Clinical Impressions(s) / UC Diagnoses   Final diagnoses:  Infective otitis externa of left ear  Impacted cerumen of left ear     Discharge Instructions      You have an ear infection of the ear canal known as otitis externa. Use ear drops as prescribed for 7 days. Do not place anything smaller than elbow deep into ear canal- this includes Q-tips. You may place a small amount of rubbing alcohol onto the end of a Q-tip and place this into the outer ear canal to dry up any remaining water that may have gotten into the ear while showering or submerging head underwater to prevent this type of infection in the future.     ED Prescriptions     Medication Sig Dispense Auth. Provider   ciprofloxacin -dexamethasone  (CIPRODEX) OTIC suspension Place 4 drops into the left ear 2 (two) times daily. 7.5 mL Starlene Eaton, FNP      PDMP not reviewed this encounter.   Starlene Eaton, [place] 12/12/23 [phone]

## 2024-01-01 ENCOUNTER — Ambulatory Visit: Payer: 59 | Admitting: Internal Medicine

## 2024-01-09 ENCOUNTER — Ambulatory Visit (INDEPENDENT_AMBULATORY_CARE_PROVIDER_SITE_OTHER): Admitting: General Practice

## 2024-01-09 ENCOUNTER — Encounter: Payer: Self-pay | Admitting: General Practice

## 2024-01-09 VITALS — BP 138/82 | HR 90 | Temp 98.3°F | Ht 70.0 in | Wt 223.0 lb

## 2024-01-09 DIAGNOSIS — I10 Essential (primary) hypertension: Secondary | ICD-10-CM | POA: Diagnosis not present

## 2024-01-09 DIAGNOSIS — R3911 Hesitancy of micturition: Secondary | ICD-10-CM | POA: Diagnosis not present

## 2024-01-09 DIAGNOSIS — H60392 Other infective otitis externa, left ear: Secondary | ICD-10-CM

## 2024-01-09 DIAGNOSIS — H6090 Unspecified otitis externa, unspecified ear: Secondary | ICD-10-CM | POA: Insufficient documentation

## 2024-01-09 DIAGNOSIS — J302 Other seasonal allergic rhinitis: Secondary | ICD-10-CM | POA: Insufficient documentation

## 2024-01-09 DIAGNOSIS — K219 Gastro-esophageal reflux disease without esophagitis: Secondary | ICD-10-CM

## 2024-01-09 DIAGNOSIS — Z7689 Persons encountering health services in other specified circumstances: Secondary | ICD-10-CM | POA: Insufficient documentation

## 2024-01-09 MED ORDER — FLUTICASONE PROPIONATE 50 MCG/ACT NA SUSP: 2.0000 | Freq: Every day | NASAL | 0 refills | Status: DC

## 2024-01-09 MED ORDER — FAMOTIDINE 20 MG PO TABS: ORAL_TABLET | ORAL | 0 refills | Status: AC

## 2024-01-09 MED ORDER — MONTELUKAST SODIUM 10 MG PO TABS: 10.0000 mg | ORAL_TABLET | Freq: Every day | ORAL | 3 refills | Status: AC

## 2024-01-09 MED ORDER — MONTELUKAST SODIUM 10 MG PO TABS: 10.0000 mg | ORAL_TABLET | Freq: Every day | ORAL | 3 refills | Status: DC

## 2024-01-09 NOTE — Assessment & Plan Note (Signed)
 Discussed at length.  Blood pressure slightly above goal today. States he is nervous.  Declines medication.  Asked patient to monitor BP at home.   Will continue to monitor.  F/u in 2 months.

## 2024-01-09 NOTE — Assessment & Plan Note (Signed)
 Controlled.   Continue Famotidine  20 mg BID PRN. Refill sent.  Continue Omeprazole  40 mg daily PRN.

## 2024-01-09 NOTE — Assessment & Plan Note (Signed)
 Improved.  Discussed to stop Cipro  otic drops.  Start Flonase daily. Rx sent.  Take daily antihistamine.   He will update in one week. If not better, consider ENT referral.

## 2024-01-09 NOTE — Assessment & Plan Note (Signed)
 EMR reviewed briefly.

## 2024-01-09 NOTE — Assessment & Plan Note (Signed)
 Controlled.  Continue Singular 10 mg once daily. Rx sent.  Continue oral antihistamine as needed.  Continue Flonase as needed. Rx sent.

## 2024-01-09 NOTE — Patient Instructions (Addendum)
 Try using Flonase (fluticasone) nasal spray. Instill 1 spray in each nostril twice daily.  Restart Singular 10 mg once daily. Refill sent.  Take daily Claritin  or Allegra .   Update me via mychart or phone call about your symptoms and I will place the ENT referral.   Famotidine  has been sent.   Continue monitoring BP at home.   Take Flomax  0.4mg  (2 CAPSULES) until you see the urologist in July.   Follow up in 2 months.   It was a pleasure to meet you today! Please don't hesitate to contact me with any questions. Welcome to [facility]!

## 2024-01-09 NOTE — Progress Notes (Signed)
 New Patient Office Visit  Subjective    Patient ID: Matthew Salinas, male    DOB: 24-Apr-1966  Age: 58 y.o. MRN: [id number]  CC:  Chief Complaint  Patient presents with   Establish Care    Pt complains of having ongoing L ear pain for a month. States that he is taking antibiotics drops. States of not improving.     HPI Matthew Salinas is a 58 y.o. male presents to establish care.   Last PCP/physical/labs: [facility]/ physical and labs - last year.  Left ear pain: symptom onset was a month ago or maybe longer. He doesn't remember exactly when. He was evaluated on 12/12/23 at [facility]. He had cerumen impaction and was removed and he was found have otitis externa. He was given Ciprofloxacin  ears drops. He is not having any pain but still having a "humming" sound. In the past, he has had this happened before and he was evaluated by ENT at the time.   Urinary hesitancy: He had back surgery on 07/28/23. He had a urinary catheter and since he has had problems. He was given Tamsulosin  0.8 mg once daily. It is minimally effective. He was evaluated on 11/14/23 by urologist who stated to try to decrease the Tamsulosin  and has been taking 04 mg once daily. He denies any other urinary symptoms.   Acid reflux: diagnosed many years ago. Currently managed famotidine  20 mg BID as needed. Also takes Omeprazole  40 mg as needed. Needs refill famotidine . Overall controlled.   HTN: diagnosed many years ago. Previously managed on Lisinopril -hydrochlorothiazide  10-12.5 mg once daily. After his back surgery, he stopped the medication. His blood pressure home readings range between 110s-70s. He denies any blurred vision, headaches, chest pain or shortness of breath.   Outpatient Encounter Medications as of 01/09/2024  Medication Sig   acetaminophen  (TYLENOL ) 500 MG tablet Take 500-1,000 mg by mouth every 6 (six) hours as needed (pain).   celecoxib  (CELEBREX ) 100 MG capsule Take 1 tab q 12 hours as needed   ciprofloxacin   (CIPRO ) 500 MG tablet Take 1 tablet (500 mg total) by mouth 2 (two) times daily.   ciprofloxacin -dexamethasone  (CIPRODEX ) OTIC suspension Place 4 drops into the left ear 2 (two) times daily.   COENZYME Q10 PO Take 1 capsule by mouth in the morning.   fluticasone (FLONASE) 50 MCG/ACT nasal spray Place 2 sprays into both nostrils daily.   hydrocortisone  (ANUSOL -HC) 25 MG suppository Insert 1 rectal suppository 2 x /day   lidocaine -prilocaine  (EMLA ) cream APPLY TOPICALLY AS NEEDED   lisinopril -hydrochlorothiazide  (ZESTORETIC ) 20-25 MG tablet Take 1 tablet by mouth daily.   nitroGLYCERIN  (NITROGLYN) 2 % ointment Apply   1/2 "  to Hemorrhoid  every 6 hours  as needed   nitroGLYCERIN  (NITROGLYN) 2 % ointment Apply 0.5 inches topically as needed.   PROCTO-MED HC  2.5 % rectal cream APPLY CREAM  RECTALLY 4 TIMES DAILY (Patient taking differently: Place 1 Application rectally 4 (four) times daily as needed for hemorrhoids.)   tadalafil  (CIALIS ) 5 MG tablet Take one 5 mg tablet daily for BPH   tamsulosin  (FLOMAX ) 0.4 MG CAPS capsule Take 2 capsules (0.8 mg total) by mouth daily.   [DISCONTINUED] famotidine  (PEPCID ) 20 MG tablet Take  1 tablet  2 x /day to Prevent Heart burn & Indigestion                                                           /  TAKE                                            BY                                              MOUTH (Patient taking differently: Take 20 mg by mouth 2 (two) times daily as needed for indigestion or heartburn.)   [DISCONTINUED] montelukast  (SINGULAIR ) 10 MG tablet Take 1 tablet (10 mg total) by mouth at bedtime.   famotidine  (PEPCID ) 20 MG tablet Take  1 tablet  2 x /day to Prevent Heart burn & Indigestion                                                           /                                       TAKE                                            BY                                              MOUTH   montelukast  (SINGULAIR ) 10  MG tablet Take 1 tablet (10 mg total) by mouth at bedtime.   No facility-administered encounter medications on file as of 01/09/2024.    Past Medical History:  Diagnosis Date   Allergy    COVID-19    DDD (degenerative disc disease), lumbar    GERD (gastroesophageal reflux disease)    Hyperlipidemia    Hypertension    IBS (irritable bowel syndrome)    Lumbar spondylosis    Sleep apnea    mouth gaurd   Traumatic pneumothorax 12/04/2015   Vitamin D  deficiency     Past Surgical History:  Procedure Laterality Date   CHEST TUBE INSERTION Right 05/15/2002   thoracostomy   COLONOSCOPY     ESOPHAGOGASTRODUODENOSCOPY (EGD) WITH PROPOFOL  N/A 12/15/2020   Procedure: ESOPHAGOGASTRODUODENOSCOPY (EGD) WITH PROPOFOL ;  Surgeon: Marshall Skeeter, MD;  Location: [facility];  Service: Endoscopy;  Laterality: N/A;  with possible dilation   LUMBAR LAMINECTOMY/DECOMPRESSION MICRODISCECTOMY N/A 07/28/2023   Procedure: L3-5 POSTERIOR SPINAL DECOMPRESSION;  Surgeon: Jodeen Munch, MD;  Location: [facility] ORS;  Service: Neurosurgery;  Laterality: N/A;   TOE SURGERY  08/05/2006   right big toe    Family History  Problem Relation Age of Onset   Cancer Mother        lung   Hyperlipidemia Mother    Hypertension Father    Hyperlipidemia Father    Dementia Brother    Hyperlipidemia Brother    Hyperlipidemia Brother    Hyperlipidemia Brother    Colon cancer Maternal  Grandmother    Diabetes Maternal Grandfather     Social History   Socioeconomic History   Marital status: Significant Other    Spouse name: Shelvy Dickens   Number of children: Not on file   Years of education: Not on file   Highest education level: Not on file  Occupational History   Not on file  Tobacco Use   Smoking status: Former    Current packs/day: 0.00    Types: Cigarettes    Quit date: 08/05/2001    Years since quitting: 22.4   Smokeless tobacco: Never   Tobacco comments:    Smoked 5-10 cigarettes per day since age 69   Vaping Use   Vaping status: Never Used  Substance and Sexual Activity   Alcohol use: Yes    Alcohol/week: 10.0 standard drinks of alcohol    Types: 10 Standard drinks or equivalent per week   Drug use: No   Sexual activity: Not Currently  Other Topics Concern   Not on file  Social History Narrative   Not on file   Social Drivers of Health   Financial Resource Strain: Not on file  Food Insecurity: Patient Declined (07/29/2023)   Hunger Vital Sign    Worried About Running Out of Food in the Last Year: Patient declined    Ran Out of Food in the Last Year: Patient declined  Transportation Needs: No Transportation Needs (07/29/2023)   PRAPARE - Administrator, Civil Service (Medical): No    Lack of Transportation (Non-Medical): No  Physical Activity: Not on file  Stress: Not on file  Social Connections: Not on file  Intimate Partner Violence: Not At Risk (07/29/2023)   Humiliation, Afraid, Rape, and Kick questionnaire    Fear of Current or Ex-Partner: No    Emotionally Abused: No    Physically Abused: No    Sexually Abused: No    Review of Systems  Constitutional:  Negative for chills and fever.  HENT:  Positive for ear pain. Negative for congestion, sinus pain and sore throat.   Respiratory:  Negative for cough and shortness of breath.   Cardiovascular:  Negative for chest pain.  Gastrointestinal:  Negative for abdominal pain, constipation, diarrhea, heartburn, nausea and vomiting.  Genitourinary:  Negative for dysuria, frequency and urgency.  Neurological:  Negative for dizziness and headaches.  Endo/Heme/Allergies:  Negative for polydipsia.  Psychiatric/Behavioral:  Negative for depression and suicidal ideas. The patient is not nervous/anxious.         Objective    BP 138/82   Pulse 90   Temp 98.3 F (36.8 C) (Oral)   Ht 5\' 10"  (1.778 m)   Wt 223 lb (101.2 kg)   SpO2 98%   BMI 32.00 kg/m   Physical Exam Vitals and nursing note reviewed.   Constitutional:      Appearance: Normal appearance.  HENT:     Right Ear: Tympanic membrane, ear canal and external ear normal.     Left Ear: Tympanic membrane, ear canal and external ear normal.     Nose: No congestion.     Mouth/Throat:     Pharynx: No oropharyngeal exudate or posterior oropharyngeal erythema.  Cardiovascular:     Rate and Rhythm: Normal rate and regular rhythm.     Pulses: Normal pulses.     Heart sounds: Normal heart sounds.  Pulmonary:     Effort: Pulmonary effort is normal.     Breath sounds: Normal breath sounds.  Neurological:  Mental Status: He is alert and oriented to person, place, and time.  Psychiatric:        Mood and Affect: Mood normal.        Behavior: Behavior normal.        Thought Content: Thought content normal.        Judgment: Judgment normal.         Assessment & Plan:  Other infective acute otitis externa of left ear Assessment & Plan: Improved.  Discussed to stop Cipro  otic drops.  Start Flonase daily. Rx sent.  Take daily antihistamine.   He will update in one week. If not better, consider ENT referral.     Establishing care with new doctor, encounter for Assessment & Plan: EMR reviewed briefly.    Seasonal allergies Assessment & Plan: Controlled.  Continue Singular 10 mg once daily. Rx sent.  Continue oral antihistamine as needed.  Continue Flonase as needed. Rx sent.  Orders: -     Montelukast  Sodium; Take 1 tablet (10 mg total) by mouth at bedtime.  Dispense: 30 tablet; Refill: 3 -     Fluticasone Propionate; Place 2 sprays into both nostrils daily.  Dispense: 16 g; Refill: 0  Urinary hesitancy Assessment & Plan: Uncontrolled.  Reviewed urology notes.  Discussed at length with patient.  Continue Flomax  0.8 mg once daily.  Advised patient to take Flomax  as prescribed to see if this helps his symptoms. He is scheduled for f/u with urology in mid July.  Will continue to monitor. F/u with me in  August.   Gastroesophageal reflux disease, unspecified whether esophagitis present Assessment & Plan: Controlled.   Continue Famotidine  20 mg BID PRN. Refill sent.  Continue Omeprazole  40 mg daily PRN.    Orders: -     Famotidine ; Take  1 tablet  2 x /day to Prevent Heart burn & Indigestion                                                           /                                       TAKE                                            BY                                              MOUTH  Dispense: 90 tablet; Refill: 0  Essential hypertension Assessment & Plan: Discussed at length.  Blood pressure slightly above goal today. States he is nervous.  Declines medication.  Asked patient to monitor BP at home.   Will continue to monitor.  F/u in 2 months.      Return in about 2 months (around 03/10/2024) for chronic care management.Jolanda Nation, NP

## 2024-01-09 NOTE — Assessment & Plan Note (Signed)
 Uncontrolled.  Reviewed urology notes.  Discussed at length with patient.  Continue Flomax  0.8 mg once daily.  Advised patient to take Flomax  as prescribed to see if this helps his symptoms. He is scheduled for f/u with urology in mid July.  Will continue to monitor. F/u with me in August.

## 2024-01-17 ENCOUNTER — Encounter: Payer: Self-pay | Admitting: General Practice

## 2024-01-17 DIAGNOSIS — H60392 Other infective otitis externa, left ear: Secondary | ICD-10-CM

## 2024-01-17 DIAGNOSIS — J302 Other seasonal allergic rhinitis: Secondary | ICD-10-CM

## 2024-02-13 ENCOUNTER — Ambulatory Visit: Admitting: Urology

## 2024-03-12 ENCOUNTER — Ambulatory Visit: Admitting: General Practice

## 2024-03-12 ENCOUNTER — Encounter: Payer: Self-pay | Admitting: General Practice

## 2024-03-12 VITALS — BP 136/84 | HR 80 | Temp 98.3°F | Ht 70.0 in | Wt 221.4 lb

## 2024-03-12 DIAGNOSIS — E559 Vitamin D deficiency, unspecified: Secondary | ICD-10-CM

## 2024-03-12 DIAGNOSIS — E782 Mixed hyperlipidemia: Secondary | ICD-10-CM | POA: Diagnosis not present

## 2024-03-12 DIAGNOSIS — R3911 Hesitancy of micturition: Secondary | ICD-10-CM | POA: Diagnosis not present

## 2024-03-12 DIAGNOSIS — E66811 Obesity, class 1: Secondary | ICD-10-CM

## 2024-03-12 DIAGNOSIS — I10 Essential (primary) hypertension: Secondary | ICD-10-CM

## 2024-03-12 DIAGNOSIS — Z6831 Body mass index (BMI) 31.0-31.9, adult: Secondary | ICD-10-CM

## 2024-03-12 DIAGNOSIS — E669 Obesity, unspecified: Secondary | ICD-10-CM | POA: Insufficient documentation

## 2024-03-12 DIAGNOSIS — E6609 Other obesity due to excess calories: Secondary | ICD-10-CM

## 2024-03-12 DIAGNOSIS — Z9189 Other specified personal risk factors, not elsewhere classified: Secondary | ICD-10-CM

## 2024-03-12 DIAGNOSIS — K219 Gastro-esophageal reflux disease without esophagitis: Secondary | ICD-10-CM

## 2024-03-12 LAB — COMPREHENSIVE METABOLIC PANEL WITH GFR
ALT: 17 U/L (ref 0–53)
AST: 19 U/L (ref 0–37)
Albumin: 4.6 g/dL (ref 3.5–5.2)
Alkaline Phosphatase: 56 U/L (ref 39–117)
BUN: 12 mg/dL (ref 6–23)
CO2: 28 meq/L (ref 19–32)
Calcium: 9.3 mg/dL (ref 8.4–10.5)
Chloride: 100 meq/L (ref 96–112)
Creatinine, Ser: 0.77 mg/dL (ref 0.40–1.50)
GFR: 97.62 mL/min (ref >60.00)
Glucose, Bld: 101 mg/dL — ABNORMAL HIGH (ref 70–99)
Potassium: 4.6 meq/L (ref 3.5–5.1)
Sodium: 138 meq/L (ref 135–145)
Total Bilirubin: 0.9 mg/dL (ref 0.2–1.2)
Total Protein: 6.8 g/dL (ref 6.0–8.3)

## 2024-03-12 LAB — LIPID PANEL
Cholesterol: 276 mg/dL — ABNORMAL HIGH (ref 0–200)
HDL: 50.3 mg/dL (ref >39.00)
LDL Cholesterol: 162 mg/dL — ABNORMAL HIGH (ref 0–99)
NonHDL: 225.66
Total CHOL/HDL Ratio: 5
Triglycerides: 318 mg/dL — ABNORMAL HIGH (ref 0.0–149.0)
VLDL: 63.6 mg/dL — ABNORMAL HIGH (ref 0.0–40.0)

## 2024-03-12 LAB — CBC
HCT: 44.7 % (ref 39.0–52.0)
Hemoglobin: 14.9 g/dL (ref 13.0–17.0)
MCHC: 33.3 g/dL (ref 30.0–36.0)
MCV: 92.5 fl (ref 78.0–100.0)
Platelets: 294 K/uL (ref 150.0–400.0)
RBC: 4.83 Mil/uL (ref 4.22–5.81)
RDW: 12.7 % (ref 11.5–15.5)
WBC: 5.2 K/uL (ref 4.0–10.5)

## 2024-03-12 LAB — HEMOGLOBIN A1C: Hgb A1c MFr Bld: 5.8 % (ref 4.6–6.5)

## 2024-03-12 LAB — TSH: TSH: 1.78 u[IU]/mL (ref 0.35–5.50)

## 2024-03-12 LAB — TESTOSTERONE: Testosterone: 336.01 ng/dL (ref 300.00–890.00)

## 2024-03-12 LAB — PSA: PSA: 0.81 ng/mL (ref 0.10–4.00)

## 2024-03-12 LAB — VITAMIN D 25 HYDROXY (VIT D DEFICIENCY, FRACTURES): VITD: 67.47 ng/mL (ref 30.00–100.00)

## 2024-03-12 MED ORDER — OMEPRAZOLE 40 MG PO CPDR: 40.0000 mg | DELAYED_RELEASE_CAPSULE | Freq: Every day | ORAL | 1 refills | Status: AC

## 2024-03-12 NOTE — Assessment & Plan Note (Signed)
 No red flags today.  Referral placed for dermatology.

## 2024-03-12 NOTE — Patient Instructions (Addendum)
 Stop by the lab prior to leaving today. I will notify you of your results once received.   Start monitoring your blood pressure daily, around the same time of day, for the next 2-3 weeks.  Ensure that you have rested for 30 minutes prior to checking your blood pressure.   Record your readings and notify me if you see numbers consistently at or above 130 on top and/or 90 on bottom.  Send me the readings in 2-3 weeks via mychart.   Omeprazole  40 mg once daily. Refill sent.  Follow up in November.   It was a pleasure to see you today!  You will either be contacted via phone regarding your referral to dermatology, or you may receive a letter on your MyChart portal from our referral team with instructions for scheduling an appointment. Please let us  know if you have not been contacted by anyone within two weeks.

## 2024-03-12 NOTE — Assessment & Plan Note (Signed)
 Vitamin d pending

## 2024-03-12 NOTE — Assessment & Plan Note (Signed)
 Discussed at length.  Controlled Slightly above goal. Home readings seems to be much lower.  Discussed to restart hydrochlorothiazide  if home readings remain above 130/90. He will monitor BP for the next 2-3 weeks and send them via mychart.  BP log given.   F/u in November.

## 2024-03-12 NOTE — Assessment & Plan Note (Signed)
 Repeat lipid panel pending.

## 2024-03-12 NOTE — Assessment & Plan Note (Signed)
 Improving.   Scheduled to see urologist in October.

## 2024-03-12 NOTE — Progress Notes (Signed)
 Established Patient Office Visit  Subjective   Patient ID: Matthew Salinas, male    DOB: 04-26-1966  Age: 58 y.o. MRN: [id number]  Chief Complaint  Patient presents with   Medical Management of Chronic Issues    Here for 2 mo f/u.     HPI  Matthew Salinas is a 58 year old male with HTN, sleep apnea, dysphagia, HLD, vitamin d  deficiency, anxiety, presents today for a two month follow up.  Urinary hesitancy: reports that he discontinued Flomax . He is scheduled to see the urologist in October. He has some intermittent urinary hesitancy but it is improving.   GERD:  He has noticed the one from OTC omeprezole 20 mg BID is not giving him much relief. He would like a prescription of Omeprazole  40 mg instead of the omeprazole  20 mg BID. Feels his symptoms are much more controlled with that. He is also taking Pepcid  20 mg BID PRN as needed. He is trying to avoid trigger foods but still have heartburn at times.  HTN: The home BP readings have been in the 118-upper 120's / 70's-80's range.He was previously managed on lisinopril -hydrochlorothiazide  20-25 mg once daily but hasn't taken any since December.  He denies any chest pain, shortness of breath.   Sun exposure: has a few spots on his arms and face he wants looked at. He has had sun exposure due to work all of his life. He wants to make sure there are no concerning areas. Would like a dermatology referral.  Patient Active Problem List   Diagnosis Date Noted   Class 1 obesity due to excess calories with serious comorbidity and body mass index (BMI) of 31.0 to 31.9 in adult 03/12/2024   Hx of moderate sun exposure 03/12/2024   Establishing care with new doctor, encounter for 01/09/2024   Seasonal allergies 01/09/2024   Urinary hesitancy 01/09/2024   Otitis externa 01/09/2024   Dysphagia 12/12/2023   Bleeding per rectum 12/12/2023   Anal fissure 09/01/2023   Neurogenic claudication due to lumbar spinal stenosis 07/28/2023   Severe sleep  apnea 06/05/2021   Snoring 05/07/2021   Choking 05/07/2021   Difficulty staying asleep 05/07/2021   Non-restorative sleep 05/07/2021   Tonsillar hypertrophy 05/07/2021   Statin myopathy 03/01/2020   Overweight (BMI 25.0-29.9) 08/17/2017   Snorings (without sleep apnea) 12/04/2015   Rhinitis due to pollen 12/04/2015   Acid reflux 09/19/2015   Chronic anxiety 09/19/2015   Hx of pneumothorax 05/16/2014   Essential hypertension 09/19/2013   Hyperlipidemia, mixed 09/19/2013   Abnormal glucose 09/19/2013   Vitamin D  deficiency 09/19/2013   Medication management 09/19/2013   Past Medical History:  Diagnosis Date   Allergy    COVID-19    DDD (degenerative disc disease), lumbar    GERD (gastroesophageal reflux disease)    Hyperlipidemia    Hypertension    IBS (irritable bowel syndrome)    Lumbar spondylosis    Sleep apnea    mouth gaurd   Traumatic pneumothorax 12/04/2015   Vitamin D  deficiency    Past Surgical History:  Procedure Laterality Date   CHEST TUBE INSERTION Right 05/15/2002   thoracostomy   COLONOSCOPY     ESOPHAGOGASTRODUODENOSCOPY (EGD) WITH PROPOFOL  N/A 12/15/2020   Procedure: ESOPHAGOGASTRODUODENOSCOPY (EGD) WITH PROPOFOL ;  Surgeon: Dessa Reyes LELON, MD;  Location: [facility];  Service: Endoscopy;  Laterality: N/A;  with possible dilation   LUMBAR LAMINECTOMY/DECOMPRESSION MICRODISCECTOMY N/A 07/28/2023   Procedure: L3-5 POSTERIOR SPINAL DECOMPRESSION;  Surgeon: Clois Fret, MD;  Location: [facility];  Service: Neurosurgery;  Laterality: N/A;   TOE SURGERY  08/05/2006   right big toe   Allergies  Allergen Reactions   Lipitor [Atorvastatin]     Myalgias   Prozac [Fluoxetine Hcl] Other (See Comments)    Did not like the way it made him feel/think   Rosuvastatin      myalgias   Simvastatin      Myalgias         03/12/2024   10:19 AM 01/09/2024    9:00 AM 06/19/2023   10:41 PM  Depression screen PHQ 2/9  Decreased Interest 0 0 0  Down,  Depressed, Hopeless 0 0 0  PHQ - 2 Score 0 0 0  Altered sleeping  0   Tired, decreased energy  2   Change in appetite  0   Feeling bad or failure about yourself   0   Trouble concentrating  0   Moving slowly or fidgety/restless  0   Suicidal thoughts  0   PHQ-9 Score  2   Difficult doing work/chores  Not difficult at all        03/12/2024   10:19 AM 01/09/2024    9:00 AM  GAD 7 : Generalized Anxiety Score  Nervous, Anxious, on Edge 0 0  Control/stop worrying 0 1  Worry too much - different things 0 1  Trouble relaxing 0 0  Restless 0 1  Easily annoyed or irritable 0 0  Afraid - awful might happen 0 0  Total GAD 7 Score 0 3      Review of Systems  Constitutional:  Negative for chills and fever.  Respiratory:  Negative for shortness of breath.   Cardiovascular:  Negative for chest pain.  Gastrointestinal:  Negative for abdominal pain, constipation, diarrhea, heartburn, nausea and vomiting.  Genitourinary:  Negative for dysuria, frequency and urgency.  Neurological:  Negative for dizziness and headaches.  Endo/Heme/Allergies:  Negative for polydipsia.  Psychiatric/Behavioral:  Negative for depression and suicidal ideas. The patient is not nervous/anxious.       Objective:     BP 136/84   Pulse 80   Temp 98.3 F (36.8 C) (Oral)   Ht 5' 10 (1.778 m)   Wt 221 lb 6 oz (100.4 kg)   SpO2 98%   BMI 31.76 kg/m  BP Readings from Last 3 Encounters:  03/12/24 136/84  01/09/24 138/82  12/12/23 136/86   Wt Readings from Last 3 Encounters:  03/12/24 221 lb 6 oz (100.4 kg)  01/09/24 223 lb (101.2 kg)  12/12/23 220 lb 0.3 oz (99.8 kg)      Physical Exam Vitals and nursing note reviewed.  Constitutional:      Appearance: Normal appearance.  Cardiovascular:     Rate and Rhythm: Normal rate and regular rhythm.     Pulses: Normal pulses.     Heart sounds: Normal heart sounds.  Pulmonary:     Effort: Pulmonary effort is normal.     Breath sounds: Normal breath sounds.   Neurological:     Mental Status: He is alert and oriented to person, place, and time.  Psychiatric:        Mood and Affect: Mood normal.        Behavior: Behavior normal.        Thought Content: Thought content normal.        Judgment: Judgment normal.      No results found for any visits on 03/12/24.     The 10-year ASCVD  risk score (Arnett DK, et al., 2019) is: 10.8%    Assessment & Plan:  Gastroesophageal reflux disease, unspecified whether esophagitis present Assessment & Plan: Controlled.   Continue Famotidine  20 mg BID PRN.  Continue omeprazole  40 mg daily PRN. Rx sent.  Orders: -     Omeprazole ; Take 1 capsule (40 mg total) by mouth daily.  Dispense: 90 capsule; Refill: 1  Essential hypertension Assessment & Plan: Discussed at length.  Controlled Slightly above goal. Home readings seems to be much lower.  Discussed to restart hydrochlorothiazide  if home readings remain above 130/90. He will monitor BP for the next 2-3 weeks and send them via mychart.  BP log given.   F/u in November.   Orders: -     CBC -     Comprehensive metabolic panel with GFR -     TSH -     Hemoglobin A1c  Hyperlipidemia, mixed Assessment & Plan: Repeat lipid panel pending.  Orders: -     CBC -     Comprehensive metabolic panel with GFR -     TSH -     Hemoglobin A1c -     Lipid panel  Vitamin D  deficiency Assessment & Plan: Vitamin d  pending.  Orders: -     VITAMIN D  25 Hydroxy (Vit-D Deficiency, Fractures)  Urinary hesitancy Assessment & Plan: Improving.   Scheduled to see urologist in October.   Orders: -     Testosterone  -     PSA  Class 1 obesity due to excess calories with serious comorbidity and body mass index (BMI) of 31.0 to 31.9 in adult Assessment & Plan: Discussed the importance of healthy diet and exercise to affect sustainable weight loss.   Orders: -     CBC -     Comprehensive metabolic panel with GFR -     TSH -     Hemoglobin A1c  Hx  of moderate sun exposure Assessment & Plan: No red flags today.  Referral placed for dermatology.  Orders: -     Ambulatory referral to Dermatology     Return in about 3 months (around 06/19/2024) for physical.    Carrol Aurora, NP

## 2024-03-12 NOTE — Assessment & Plan Note (Signed)
 Discussed the importance of healthy diet and exercise to affect sustainable weight loss.

## 2024-03-12 NOTE — Assessment & Plan Note (Signed)
 Controlled.   Continue Famotidine  20 mg BID PRN.  Continue omeprazole  40 mg daily PRN. Rx sent.

## 2024-03-15 ENCOUNTER — Ambulatory Visit: Payer: Self-pay | Admitting: General Practice

## 2024-03-29 ENCOUNTER — Telehealth: Payer: Self-pay | Admitting: General Practice

## 2024-03-29 NOTE — Telephone Encounter (Signed)
BP readings placed in folder for review

## 2024-03-29 NOTE — Telephone Encounter (Signed)
 Dropped off blood pressure readings to be reviewed by provider

## 2024-03-30 NOTE — Telephone Encounter (Unsigned)
 Copied from CRM #[id number]. Topic: General - Other >> Mar 30, 2024  1:02 PM Dedra B wrote: Reason for CRM: Pt returning call. Does not know what the call was regarding.

## 2024-03-30 NOTE — Telephone Encounter (Signed)
 LMTCB

## 2024-03-31 NOTE — Telephone Encounter (Signed)
 LMTCB

## 2024-04-02 ENCOUNTER — Ambulatory Visit: Payer: Self-pay | Admitting: Nurse Practitioner

## 2024-04-04 ENCOUNTER — Other Ambulatory Visit: Payer: Self-pay | Admitting: Physician Assistant

## 2024-04-06 NOTE — Telephone Encounter (Signed)
 LMTCB

## 2024-04-20 ENCOUNTER — Ambulatory Visit: Admitting: Neurosurgery

## 2024-04-20 ENCOUNTER — Encounter: Payer: Self-pay | Admitting: Neurosurgery

## 2024-04-20 VITALS — BP 144/84 | Ht 70.0 in | Wt 221.0 lb

## 2024-04-20 DIAGNOSIS — Z09 Encounter for follow-up examination after completed treatment for conditions other than malignant neoplasm: Secondary | ICD-10-CM

## 2024-04-20 DIAGNOSIS — M48062 Spinal stenosis, lumbar region with neurogenic claudication: Secondary | ICD-10-CM | POA: Diagnosis not present

## 2024-04-20 NOTE — Progress Notes (Signed)
   REFERRING PHYSICIAN:  Tonita Fallow, Md No address on file  DOS: 07/28/23 L3-5 decompression and CSF leak repair  HISTORY OF PRESENT ILLNESS: Matthew Salinas is approximately 9 months status post above surgery. he is doing well despite some continued numbness and discomfort in his L leg.  He has been dealing with some prostate issues.  PHYSICAL EXAMINATION:  General: Patient is well developed, well nourished, calm, collected, and in no apparent distress.   NEUROLOGICAL:  General: In no acute distress.   Awake, alert, oriented to person, place, and time.  Pupils equal round and reactive to light.    Strength:            Side Iliopsoas Quads Hamstring PF DF EHL  R 5 5 5 5 5 5   L 5 5 5 5 5 5    Incision well healed   ROS (Neurologic):  Negative except as noted above  IMAGING: No interval imaging to review  ASSESSMENT/PLAN:  Matthew Salinas is doing fairly well approximately 9 months after lumbar decompression.   I recommended that he consider starting Celebrex  after he finishes prednisone  taper.  I will see him back on an as-needed basis.  I spent a total of 10 minutes in this patient's care today. This time was spent reviewing pertinent records including imaging studies, obtaining and confirming history, performing a directed evaluation, formulating and discussing my recommendations, and documenting the visit within the medical record.    Reeves Daisy MD Department of neurosurgery

## 2024-05-04 ENCOUNTER — Ambulatory Visit: Admitting: Dermatology

## 2024-05-05 ENCOUNTER — Ambulatory Visit: Admitting: Urology

## 2024-05-20 ENCOUNTER — Other Ambulatory Visit: Payer: Self-pay | Admitting: General Practice

## 2024-05-20 DIAGNOSIS — J302 Other seasonal allergic rhinitis: Secondary | ICD-10-CM

## 2024-05-23 ENCOUNTER — Other Ambulatory Visit: Payer: Self-pay | Admitting: General Practice

## 2024-05-23 DIAGNOSIS — J302 Other seasonal allergic rhinitis: Secondary | ICD-10-CM

## 2024-06-15 ENCOUNTER — Ambulatory Visit: Admitting: General Practice

## 2024-06-21 ENCOUNTER — Encounter: Admitting: General Practice

## 2024-06-21 DIAGNOSIS — Z0000 Encounter for general adult medical examination without abnormal findings: Secondary | ICD-10-CM

## 2024-07-05 ENCOUNTER — Ambulatory Visit (INDEPENDENT_AMBULATORY_CARE_PROVIDER_SITE_OTHER): Admitting: General Practice

## 2024-07-05 ENCOUNTER — Encounter: Payer: Self-pay | Admitting: General Practice

## 2024-07-05 VITALS — BP 136/80 | HR 85 | Temp 97.6°F | Ht 70.0 in | Wt 228.0 lb

## 2024-07-05 DIAGNOSIS — H6062 Unspecified chronic otitis externa, left ear: Secondary | ICD-10-CM | POA: Diagnosis not present

## 2024-07-05 DIAGNOSIS — Z6832 Body mass index (BMI) 32.0-32.9, adult: Secondary | ICD-10-CM

## 2024-07-05 DIAGNOSIS — Z0000 Encounter for general adult medical examination without abnormal findings: Secondary | ICD-10-CM | POA: Insufficient documentation

## 2024-07-05 DIAGNOSIS — J302 Other seasonal allergic rhinitis: Secondary | ICD-10-CM | POA: Diagnosis not present

## 2024-07-05 DIAGNOSIS — E66811 Obesity, class 1: Secondary | ICD-10-CM | POA: Diagnosis not present

## 2024-07-05 DIAGNOSIS — R519 Headache, unspecified: Secondary | ICD-10-CM | POA: Insufficient documentation

## 2024-07-05 DIAGNOSIS — E6609 Other obesity due to excess calories: Secondary | ICD-10-CM

## 2024-07-05 NOTE — Assessment & Plan Note (Signed)
 No red flags on exam.   Advised to try Excedrin.

## 2024-07-05 NOTE — Patient Instructions (Addendum)
 Follow up with ENT as scheduled.   Stop zyrtec.  Start Allegra  or Xyzal once daily.  Continue Flonase  daily.   Try Excedrin for headache.   Schedule physical for next year.   It was a pleasure to see you today!

## 2024-07-05 NOTE — Assessment & Plan Note (Signed)
 Deterioating.   Following with ENT.  Continue beta-histamine per ENT.  Continue Flonase .

## 2024-07-05 NOTE — Assessment & Plan Note (Signed)
 Immunizations tdap - UTD; declines influenza, shingles or pneumonia vaccine. Colonoscopy UTD,  PSA UTD  Discussed the importance of a healthy diet and regular exercise in order for weight loss, and to reduce the risk of further co-morbidity.  Exam stable. Labs pending.  Follow up in 1 year for repeat physical.

## 2024-07-05 NOTE — Assessment & Plan Note (Addendum)
 Controlled.  Continue Singular 10 mg once daily, Allegra  or xyzal once daily and flonase  daily.

## 2024-07-05 NOTE — Progress Notes (Signed)
 Established Patient Office Visit  Subjective   Patient ID: Matthew Salinas, male    DOB: 1966-07-20  Age: 58 y.o. MRN: [id number]  Chief Complaint  Patient presents with   Annual Exam    Labs done    HPI  Matthew Salinas is a 58 year old male with past medical history of HTN, sleep apnea, GERD, HLD, chronic anxiety presents today for complete physical and follow up of chronic conditions.  Immunizations: -Tetanus: Completed in 2017 -Influenza: due  -Shingles: Completed Shingrix series -Pneumonia: Completed   Diet: Fair diet.  Exercise: No regular exercise. Physically active.  Eye exam: Completes annually  Dental exam: Completes semi-annually    Colonoscopy: Completed in 2016  PSA: UTD  . Patient Active Problem List   Diagnosis Date Noted   Encounter for screening and preventative care 07/05/2024   Nonintractable headache 07/05/2024   Obesity 03/12/2024   Hx of moderate sun exposure 03/12/2024   Seasonal allergies 01/09/2024   Urinary hesitancy 01/09/2024   Otitis externa 01/09/2024   Dysphagia 12/12/2023   Bleeding per rectum 12/12/2023   Anal fissure 09/01/2023   Neurogenic claudication due to lumbar spinal stenosis 07/28/2023   Severe sleep apnea 06/05/2021   Snoring 05/07/2021   Choking 05/07/2021   Difficulty staying asleep 05/07/2021   Non-restorative sleep 05/07/2021   Tonsillar hypertrophy 05/07/2021   Statin myopathy 03/01/2020   Snorings (without sleep apnea) 12/04/2015   Rhinitis due to pollen 12/04/2015   Acid reflux 09/19/2015   Chronic anxiety 09/19/2015   Hx of pneumothorax 05/16/2014   Essential hypertension 09/19/2013   Hyperlipidemia, mixed 09/19/2013   Abnormal glucose 09/19/2013   Vitamin D  deficiency 09/19/2013   Medication management 09/19/2013   Past Medical History:  Diagnosis Date   Allergy    COVID-19    DDD (degenerative disc disease), lumbar    GERD (gastroesophageal reflux disease)    Hyperlipidemia    Hypertension     IBS (irritable bowel syndrome)    Lumbar spondylosis    Sleep apnea    mouth gaurd   Traumatic pneumothorax 12/04/2015   Vitamin D  deficiency    Past Surgical History:  Procedure Laterality Date   CHEST TUBE INSERTION Right 05/15/2002   thoracostomy   COLONOSCOPY     ESOPHAGOGASTRODUODENOSCOPY (EGD) WITH PROPOFOL  N/A 12/15/2020   Procedure: ESOPHAGOGASTRODUODENOSCOPY (EGD) WITH PROPOFOL ;  Surgeon: Dessa Reyes LELON, MD;  Location: [facility];  Service: Endoscopy;  Laterality: N/A;  with possible dilation   LUMBAR LAMINECTOMY/DECOMPRESSION MICRODISCECTOMY N/A 07/28/2023   Procedure: L3-5 POSTERIOR SPINAL DECOMPRESSION;  Surgeon: Clois Fret, MD;  Location: [facility];  Service: Neurosurgery;  Laterality: N/A;   TOE SURGERY  08/05/2006   right big toe   Allergies  Allergen Reactions   Lipitor [Atorvastatin]     Myalgias   Prozac [Fluoxetine Hcl] Other (See Comments)    Did not like the way it made him feel/think   Rosuvastatin      myalgias   Simvastatin      Myalgias         07/05/2024   10:55 AM 03/12/2024   10:19 AM 01/09/2024    9:00 AM  Depression screen PHQ 2/9  Decreased Interest 0 0 0  Down, Depressed, Hopeless 0 0 0  PHQ - 2 Score 0 0 0  Altered sleeping 1  0  Tired, decreased energy 2  2  Change in appetite 0  0  Feeling bad or failure about yourself  0  0  Trouble concentrating  0  0  Moving slowly or fidgety/restless 0  0  Suicidal thoughts 0  0  PHQ-9 Score 3  2   Difficult doing work/chores Not difficult at all  Not difficult at all     Data saved with a previous flowsheet row definition       07/05/2024   10:55 AM 03/12/2024   10:19 AM 01/09/2024    9:00 AM  GAD 7 : Generalized Anxiety Score  Nervous, Anxious, on Edge 0 0 0  Control/stop worrying 0 0 1  Worry too much - different things 0 0 1  Trouble relaxing 0 0 0  Restless 0 0 1  Easily annoyed or irritable 0 0 0  Afraid - awful might happen 0 0 0  Total GAD 7 Score 0 0 3  Anxiety  Difficulty Not difficult at all        Review of Systems  Constitutional:  Negative for chills, fever, malaise/fatigue and weight loss.  HENT:  Negative for congestion, ear discharge, ear pain, hearing loss, nosebleeds, sinus pain, sore throat and tinnitus.   Eyes:  Negative for blurred vision, double vision, pain, discharge and redness.  Respiratory:  Negative for cough, shortness of breath, wheezing and stridor.   Cardiovascular:  Negative for chest pain, palpitations and leg swelling.  Gastrointestinal:  Negative for abdominal pain, constipation, diarrhea, heartburn, nausea and vomiting.  Genitourinary:  Negative for dysuria, frequency and urgency.  Musculoskeletal:  Negative for myalgias.  Skin:  Negative for rash.  Neurological:  Negative for dizziness, tingling, seizures, weakness and headaches.  Psychiatric/Behavioral:  Negative for depression, substance abuse and suicidal ideas. The patient is not nervous/anxious.       Objective:     BP 136/80   Pulse 85   Temp 97.6 F (36.4 C) (Temporal)   Ht 5' 10 (1.778 m)   Wt 228 lb (103.4 kg)   SpO2 97%   BMI 32.71 kg/m  BP Readings from Last 3 Encounters:  07/05/24 136/80  04/20/24 (!) 144/84  03/12/24 136/84   Wt Readings from Last 3 Encounters:  07/05/24 228 lb (103.4 kg)  04/20/24 221 lb (100.2 kg)  03/12/24 221 lb 6 oz (100.4 kg)      Physical Exam Vitals and nursing note reviewed.  Constitutional:      Appearance: Normal appearance.  HENT:     Head: Normocephalic and atraumatic.     Right Ear: Tympanic membrane, ear canal and external ear normal.     Left Ear: Tympanic membrane, ear canal and external ear normal.     Nose: Nose normal.     Mouth/Throat:     Mouth: Mucous membranes are moist.     Pharynx: Oropharynx is clear.  Eyes:     Conjunctiva/sclera: Conjunctivae normal.     Pupils: Pupils are equal, round, and reactive to light.  Cardiovascular:     Rate and Rhythm: Normal rate and regular rhythm.      Pulses: Normal pulses.     Heart sounds: Normal heart sounds.  Pulmonary:     Effort: Pulmonary effort is normal.     Breath sounds: Normal breath sounds.  Abdominal:     General: Abdomen is flat. Bowel sounds are normal.     Palpations: Abdomen is soft.  Musculoskeletal:        General: Normal range of motion.     Cervical back: Normal range of motion.  Skin:    General: Skin is warm and dry.  Capillary Refill: Capillary refill takes less than 2 seconds.  Neurological:     General: No focal deficit present.     Mental Status: He is alert and oriented to person, place, and time. Mental status is at baseline.  Psychiatric:        Mood and Affect: Mood normal.        Behavior: Behavior normal.        Thought Content: Thought content normal.        Judgment: Judgment normal.      No results found for any visits on 07/05/24.     The 10-year ASCVD risk score (Arnett DK, et al., 2019) is: 14.7%    Assessment & Plan:  Encounter for screening and preventative care Assessment & Plan: Immunizations tdap - UTD; declines influenza, shingles or pneumonia vaccine. Colonoscopy UTD,  PSA UTD  Discussed the importance of a healthy diet and regular exercise in order for weight loss, and to reduce the risk of further co-morbidity.  Exam stable. Labs pending.  Follow up in 1 year for repeat physical.    Class 1 obesity due to excess calories with serious comorbidity and body mass index (BMI) of 32.0 to 32.9 in adult Assessment & Plan: Discussed the importance of healthy diet and exercise to affect sustainable weight loss.     Seasonal allergies Assessment & Plan: Controlled.  Continue Singular 10 mg once daily, Allegra  or xyzal once daily and flonase  daily.    Chronic otitis externa of left ear, unspecified type Assessment & Plan: Deterioating.   Following with ENT.  Continue beta-histamine per ENT.  Continue Flonase .    Nonintractable headache, unspecified  chronicity pattern, unspecified headache type Assessment & Plan: No red flags on exam.   Advised to try Excedrin.       Return in about 1 year (around 07/05/2025) for physical and fasting labs.SABRA Carrol Aurora, NP

## 2024-07-05 NOTE — Assessment & Plan Note (Signed)
 Discussed the importance of healthy diet and exercise to affect sustainable weight loss.

## 2024-07-07 ENCOUNTER — Other Ambulatory Visit: Payer: Self-pay | Admitting: Otolaryngology

## 2024-07-07 ENCOUNTER — Encounter: Payer: 59 | Admitting: Internal Medicine

## 2024-07-07 DIAGNOSIS — H8102 Meniere's disease, left ear: Secondary | ICD-10-CM

## 2024-07-08 ENCOUNTER — Encounter: Payer: Self-pay | Admitting: Otolaryngology

## 2024-07-22 ENCOUNTER — Inpatient Hospital Stay: Admission: RE | Admit: 2024-07-22 | Discharge: 2024-07-22 | Attending: Otolaryngology | Admitting: Otolaryngology

## 2024-07-22 DIAGNOSIS — H8102 Meniere's disease, left ear: Secondary | ICD-10-CM

## 2024-07-22 MED ORDER — GADOPICLENOL 0.5 MMOL/ML IV SOLN
10.0000 mL | Freq: Once | INTRAVENOUS | Status: AC | PRN
  Administered 2024-07-22: 09:00:00 10 mL via INTRAVENOUS

## 2024-07-26 ENCOUNTER — Other Ambulatory Visit

## 2024-08-09 ENCOUNTER — Other Ambulatory Visit: Payer: Self-pay | Admitting: Physician Assistant

## 2024-08-15 ENCOUNTER — Other Ambulatory Visit: Payer: Self-pay | Admitting: Physician Assistant

## 2025-06-29 ENCOUNTER — Other Ambulatory Visit

## 2025-07-06 ENCOUNTER — Encounter: Admitting: General Practice
